# Patient Record
Sex: Female | Born: 1950 | Race: White | Hispanic: No | Smoking: Current every day smoker
Health system: Southern US, Community
[De-identification: ages and names within clinical notes are randomized; demographics above are authoritative.]

## PROBLEM LIST (undated history)

## (undated) DIAGNOSIS — I480 Paroxysmal atrial fibrillation: Secondary | ICD-10-CM

## (undated) DIAGNOSIS — I513 Intracardiac thrombosis, not elsewhere classified: Secondary | ICD-10-CM

## (undated) DIAGNOSIS — K632 Fistula of intestine: Secondary | ICD-10-CM

## (undated) DIAGNOSIS — R42 Dizziness and giddiness: Secondary | ICD-10-CM

## (undated) DIAGNOSIS — L299 Pruritus, unspecified: Secondary | ICD-10-CM

## (undated) DIAGNOSIS — R0600 Dyspnea, unspecified: Secondary | ICD-10-CM

## (undated) DIAGNOSIS — C3492 Malignant neoplasm of unspecified part of left bronchus or lung: Secondary | ICD-10-CM

## (undated) DIAGNOSIS — I639 Cerebral infarction, unspecified: Secondary | ICD-10-CM

## (undated) DIAGNOSIS — I10 Essential (primary) hypertension: Secondary | ICD-10-CM

## (undated) DIAGNOSIS — K5792 Diverticulitis of intestine, part unspecified, without perforation or abscess without bleeding: Secondary | ICD-10-CM

## (undated) DIAGNOSIS — R0609 Other forms of dyspnea: Secondary | ICD-10-CM

## (undated) DIAGNOSIS — Z872 Personal history of diseases of the skin and subcutaneous tissue: Secondary | ICD-10-CM

## (undated) DIAGNOSIS — Z9289 Personal history of other medical treatment: Secondary | ICD-10-CM

## (undated) DIAGNOSIS — Z8489 Family history of other specified conditions: Secondary | ICD-10-CM

## (undated) DIAGNOSIS — N183 Chronic kidney disease, stage 3 (moderate): Secondary | ICD-10-CM

## (undated) DIAGNOSIS — I2510 Atherosclerotic heart disease of native coronary artery without angina pectoris: Secondary | ICD-10-CM

## (undated) HISTORY — DX: Chronic kidney disease, stage 3 (moderate): N18.3

## (undated) HISTORY — PX: EXPLORATORY LAPAROTOMY: SUR591

## (undated) HISTORY — DX: Intracardiac thrombosis, not elsewhere classified: I51.3

## (undated) HISTORY — PX: THORACOSCOPY: SUR1347

## (undated) HISTORY — PX: COLONOSCOPY: SHX174

## (undated) HISTORY — PX: OTHER SURGICAL HISTORY: SHX169

## (undated) HISTORY — DX: Personal history of diseases of the skin and subcutaneous tissue: Z87.2

## (undated) HISTORY — PX: CORONARY ARTERY BYPASS GRAFT: SHX141

## (undated) HISTORY — PX: GALLBLADDER SURGERY: SHX652

## (undated) HISTORY — DX: Cerebral infarction, unspecified: I63.9

## (undated) HISTORY — DX: Pruritus, unspecified: L29.9

## (undated) HISTORY — PX: COLON SURGERY: SHX602

## (undated) HISTORY — DX: Fistula of intestine: K63.2

## (undated) HISTORY — DX: Essential (primary) hypertension: I10

## (undated) HISTORY — PX: CHOLECYSTECTOMY: SHX55

## (undated) HISTORY — DX: Atherosclerotic heart disease of native coronary artery without angina pectoris: I25.10

## (undated) HISTORY — DX: Dizziness and giddiness: R42

## (undated) HISTORY — PX: ABDOMINAL HYSTERECTOMY: SHX81

## (undated) HISTORY — PX: SIGMOID RESECTION / RECTOPEXY: SUR1294

---

## 1988-11-03 HISTORY — PX: OTHER SURGICAL HISTORY: SHX169

## 1989-11-03 DIAGNOSIS — I2510 Atherosclerotic heart disease of native coronary artery without angina pectoris: Secondary | ICD-10-CM

## 1989-11-03 HISTORY — DX: Atherosclerotic heart disease of native coronary artery without angina pectoris: I25.10

## 2014-11-03 DIAGNOSIS — C3492 Malignant neoplasm of unspecified part of left bronchus or lung: Secondary | ICD-10-CM

## 2014-11-03 HISTORY — DX: Malignant neoplasm of unspecified part of left bronchus or lung: C34.92

## 2015-11-28 DIAGNOSIS — N823 Fistula of vagina to large intestine: Secondary | ICD-10-CM | POA: Insufficient documentation

## 2015-12-29 DIAGNOSIS — N179 Acute kidney failure, unspecified: Secondary | ICD-10-CM | POA: Insufficient documentation

## 2015-12-29 DIAGNOSIS — E860 Dehydration: Secondary | ICD-10-CM | POA: Insufficient documentation

## 2016-06-19 DIAGNOSIS — C3412 Malignant neoplasm of upper lobe, left bronchus or lung: Secondary | ICD-10-CM | POA: Insufficient documentation

## 2016-06-19 DIAGNOSIS — C3492 Malignant neoplasm of unspecified part of left bronchus or lung: Secondary | ICD-10-CM | POA: Insufficient documentation

## 2016-06-25 DIAGNOSIS — R911 Solitary pulmonary nodule: Secondary | ICD-10-CM | POA: Insufficient documentation

## 2016-07-17 DIAGNOSIS — K118 Other diseases of salivary glands: Secondary | ICD-10-CM | POA: Insufficient documentation

## 2016-07-17 DIAGNOSIS — K119 Disease of salivary gland, unspecified: Secondary | ICD-10-CM | POA: Insufficient documentation

## 2017-02-26 DIAGNOSIS — Z951 Presence of aortocoronary bypass graft: Secondary | ICD-10-CM | POA: Insufficient documentation

## 2017-02-28 DIAGNOSIS — K632 Fistula of intestine: Secondary | ICD-10-CM | POA: Insufficient documentation

## 2017-02-28 DIAGNOSIS — L299 Pruritus, unspecified: Secondary | ICD-10-CM

## 2017-02-28 DIAGNOSIS — I2510 Atherosclerotic heart disease of native coronary artery without angina pectoris: Secondary | ICD-10-CM | POA: Insufficient documentation

## 2017-02-28 DIAGNOSIS — Z872 Personal history of diseases of the skin and subcutaneous tissue: Secondary | ICD-10-CM | POA: Insufficient documentation

## 2017-02-28 HISTORY — DX: Fistula of intestine: K63.2

## 2017-02-28 HISTORY — DX: Personal history of diseases of the skin and subcutaneous tissue: Z87.2

## 2017-02-28 HISTORY — DX: Pruritus, unspecified: L29.9

## 2017-04-09 ENCOUNTER — Other Ambulatory Visit: Payer: Self-pay | Admitting: Internal Medicine

## 2017-04-09 DIAGNOSIS — R42 Dizziness and giddiness: Secondary | ICD-10-CM | POA: Insufficient documentation

## 2017-04-09 DIAGNOSIS — N183 Chronic kidney disease, stage 3 unspecified: Secondary | ICD-10-CM | POA: Insufficient documentation

## 2017-04-09 DIAGNOSIS — I10 Essential (primary) hypertension: Secondary | ICD-10-CM

## 2017-04-09 HISTORY — DX: Dizziness and giddiness: R42

## 2017-04-09 HISTORY — DX: Essential (primary) hypertension: I10

## 2017-04-09 HISTORY — DX: Chronic kidney disease, stage 3 unspecified: N18.30

## 2017-04-15 ENCOUNTER — Other Ambulatory Visit: Payer: Self-pay

## 2017-04-15 ENCOUNTER — Telehealth: Payer: Self-pay

## 2017-04-15 NOTE — Telephone Encounter (Signed)
Patient had Colorectal surgery at [facility] in [place], [place] by Dr. Nelda Severe on 11/28/2015.    Records are available for review at this time in [place].

## 2017-04-16 ENCOUNTER — Ambulatory Visit
Admission: RE | Admit: 2017-04-16 | Discharge: 2017-04-16 | Disposition: A | Discharge status: Home / Self Care | Payer: Medicare Other | Source: Ambulatory Visit | Attending: Internal Medicine | Admitting: Internal Medicine

## 2017-04-16 DIAGNOSIS — N183 Chronic kidney disease, stage 3 unspecified: Secondary | ICD-10-CM

## 2017-04-23 ENCOUNTER — Encounter: Payer: Self-pay | Admitting: General Surgery

## 2017-04-23 ENCOUNTER — Ambulatory Visit (INDEPENDENT_AMBULATORY_CARE_PROVIDER_SITE_OTHER): Payer: Medicare Other | Admitting: General Surgery

## 2017-04-23 VITALS — BP 115/77 | HR 101 | Temp 97.7°F | Ht 63.0 in | Wt 147.4 lb

## 2017-04-23 DIAGNOSIS — K436 Other and unspecified ventral hernia with obstruction, without gangrene: Secondary | ICD-10-CM

## 2017-04-23 DIAGNOSIS — K1379 Other lesions of oral mucosa: Secondary | ICD-10-CM | POA: Diagnosis not present

## 2017-04-23 DIAGNOSIS — Z932 Ileostomy status: Secondary | ICD-10-CM

## 2017-04-23 DIAGNOSIS — C3492 Malignant neoplasm of unspecified part of left bronchus or lung: Secondary | ICD-10-CM | POA: Diagnosis not present

## 2017-04-23 DIAGNOSIS — L723 Sebaceous cyst: Secondary | ICD-10-CM | POA: Diagnosis not present

## 2017-04-23 DIAGNOSIS — L089 Local infection of the skin and subcutaneous tissue, unspecified: Secondary | ICD-10-CM

## 2017-04-23 MED ORDER — SULFAMETHOXAZOLE-TRIMETHOPRIM 800-160 MG PO TABS: 1.0000 | ORAL_TABLET | Freq: Two times a day (BID) | ORAL | 0 refills | Status: DC

## 2017-04-23 NOTE — Patient Instructions (Addendum)
We would like for you to have the CT scan 04/28/17 @ 8:30 at [facility] [place] [place], [place]   We will send referrals to Oncologist and Gastroenterologist and Ears Nose Throat specialist. Someone from their office will call to schedule your appointments. If you do not hear from them by next Friday call our office so we can check on this for you.  Please pick up your Medicine at the pharmacy.  Please see your follow up appointment listed below. We may possibly remove the cyst at this appointment.

## 2017-04-23 NOTE — Progress Notes (Signed)
Patient ID: Carly Khan, female   DOB: August 28, 1951, 66 y.o.   MRN: [id number]  CC: Ileostomy in place  HPI Carly Khan is a 66 y.o. female with multiple problems presents to clinic today for evaluation. 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 Patient states that the ostomy is functioning well, she has good seals and no complaints other than occasional irritation to the skin around it. Patient does not recall when her last colonoscopy was, and the computed appears to been in 2000. She underwent a barium enema last fall which showed the anastomosis and her colon was intact but she was unable to have a CT scan of her abdomen secondary to retained barium from that study. She also has an occasional bulge in her abdomen at the site of her midline incision that is especially worse with heavy lifting or straining. It causes occasional pains requiring her to push it back in. In addition of this she has numerous cysts on her skin that have been infected. She has a current infected cyst of her left posterior back that she was on antibiotics for that improved but did not completely go away. She has had other prior cyst underneath her right breast. In addition of this last fall she was identified as having lung cancer and underwent a left upper lobe lobectomy for adenocarcinoma of the lung with noted papillary, micropapillary and lepidic pattern changes. 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 The care for these were performed in [place] prior to moving here this past winter. Despite all these problems, she denies any current fevers, chills, nausea,  vomiting, chest pain, shortness of breath, changes in ostomy output. She continues to be an every day smoker and her recent smoking intake has increased to half pack per day.  HPI  Past Medical History:  Diagnosis Date  . CAD in native artery 02/28/2017  . CKD (chronic kidney disease) stage 3, GFR 30-59 ml/min 04/09/2017  . 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 Essential hypertension 04/09/2017  . History of sebaceous cyst 02/28/2017  . Intermittent vertigo 04/09/2017  . Itching 02/28/2017  . 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 cardiac bypass  1990  . carpel tunnel    . COLON SURGERY    . COLONOSCOPY    . CT guided needle placement    . EXPLORATORY LAPAROTOMY    . GALLBLADDER SURGERY    . loop ileostomy    . SIGMOID RESECTION / RECTOPEXY    . THORACOSCOPY    . vaginectomy      Family History  Problem Relation Age of Onset  . Cancer Mother   . Heart disease Father   . Hypertension Father     Social History Social History  Substance Use Topics  . Smoking status: Current Every Day Smoker    Packs/day: 0.25    Years: 40.00  . Smokeless tobacco: Never Used  . Alcohol use No    Allergies  Allergen Reactions  . Penicillin G Hives and Itching    Current Outpatient Prescriptions  Medication Sig Dispense  Refill  . albuterol (PROVENTIL HFA;VENTOLIN HFA) 108 (90 Base) MCG/ACT inhaler Inhale 2 puffs into the lungs every 6 (six) hours as needed.    Marland Kitchen aspirin EC 81 MG tablet Take 1 tablet by mouth daily.    . meclizine (ANTIVERT) 25 MG tablet Take 1 tablet by mouth 3 (three) times daily as needed.    . metoprolol succinate (TOPROL-XL) 25 MG 24 hr tablet Take 1 tablet by mouth daily.    Marland Kitchen sulfamethoxazole-trimethoprim (BACTRIM DS,SEPTRA DS) 800-160 MG tablet Take 1 tablet by mouth 2 (two) times daily. 14 tablet 0   No current facility-administered medications for this visit.      Review of Systems A Multi-point  review of systems was asked and was negative except for the findings and in the history of present illness  Physical Exam Blood pressure 115/77, pulse (!) 101, temperature 97.7 F (36.5 C), temperature source Oral, height 5\' 3"  (1.6 m), weight 66.9 kg (147 lb 6.4 oz). CONSTITUTIONAL: No acute distress. EYES: Pupils are equal, round, and reactive to light, Sclera are non-icteric. EARS, NOSE, MOUTH AND THROAT: The oropharynx is shows changes secondary to poor dentition and is tender, especially on the left. The oral mucosa is pink and moist. Hearing is intact to voice. LYMPH NODES:  Lymph nodes in the neck are grossly normal. RESPIRATORY:  Lungs are coarse throughout but equal. There is normal respiratory effort, with equal breath sounds bilaterally, and without pathologic use of accessory muscles. Well-healed left thoracotomy incision site and left chest tube sites CARDIOVASCULAR: Heart is regular without murmurs, gallops, or rubs. GI: The abdomen is soft, nontender, and nondistended. There is a loop ileostomy present in the right lower quadrant with 2 visible barrels that are pink, patent, productive of stool. There is an obvious fascial defect in the midline with a visible bulge with Valsalva. There is no hepatosplenomegaly. There are normal bowel sounds in all quadrants. GU: Rectal deferred.   MUSCULOSKELETAL: Normal muscle strength and tone. No cyanosis or edema.   SKIN: Turgor is good and there are multiple skin changes consistent with sebaceous/dermoid cyst. A 3 cm infected cyst on the left shoulder, a 2 cm noninfected cyst underneath the right breast, evidence of an infected 5 mm dermoid cyst to the right groin area. NEUROLOGIC: Motor and sensation is grossly normal. Cranial nerves are grossly intact. PSYCH:  Oriented to person, place and time. Affect is normal.  Data Reviewed Images and labs reviewed. They are all reported and care everywhere and from last fall. Most recent CBC was 9 months  ago which showed anemia with an H&H of 8 point 6/26 but normal white blood cell count and normal platelets. Last chemistries performed on the same date which showed numerous electrolyte abnormalities including hyponatremia of 1:30, hypochloremia of 97, mildly elevated creatinine of 1.7, BUN of 20. 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 Barium enema shows no evidence of fistula or anastomotic leak. I have personally reviewed the patient's imaging, laboratory findings and medical records.    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    Plan    66 year old female with multiple problems presents to clinic today to establish care. In terms of her loop ileostomy discussed that we would need to obtain a CT scan of the abdomen  prior to scheduling his reversal even though she's had a normal. Minimal. This is mostly due to the hernia that is present to the midline and 2 evaluate her abdominal anatomy prior to scheduling any surgical intervention. We will also refer GI for colonoscopy prior to doing any surgery for her hernia given that she is due for colonoscopy within the next 2 years. Patient also has an infected sebaceous cyst on her left shoulder. She'll be prescribed a course of Bactrim and follow-up in clinic in 2 weeks to evaluate resolution of infection. Due to her recent surgery for lung cancer we will also perform a CT scan of her chest and refer her to oncology for further lung cancer follow-up and screening. Due to her oral pain and abdomen mildly seen on CT scan of the head and neck performed 7 months ago we will refer to ENT for further evaluation.  Patient will follow-up in clinic in 2 weeks at which point we will evaluate improvement on antibiotics, and discuss continue  scheduling for all her medical problems. Hopefully able to perform surgery to reverse her ileostomy and repair her ventral hernia after being evaluated by GI, oncology, ENT.     Time spent with the patient was 45 minutes, with more than 50% of the time spent in face-to-face education, counseling and care coordination.     Clayburn Pert, MD FACS General Surgeon 04/23/2017, 9:56 AM

## 2017-04-24 ENCOUNTER — Telehealth: Payer: Self-pay | Admitting: General Surgery

## 2017-04-24 ENCOUNTER — Telehealth: Payer: Self-pay | Admitting: Gastroenterology

## 2017-04-24 MED ORDER — SULFAMETHOXAZOLE-TRIMETHOPRIM 800-160 MG PO TABS: 1.0000 | ORAL_TABLET | Freq: Two times a day (BID) | ORAL | 0 refills | Status: DC

## 2017-04-24 NOTE — Telephone Encounter (Signed)
Medication sent to [facility] [place] at this time.

## 2017-04-24 NOTE — Telephone Encounter (Signed)
Patient's daughter says the pharmacy has not received her medication. She wants to make sure it is called into [facility] on [address]. She would like a call back once its called in.

## 2017-04-24 NOTE — Telephone Encounter (Signed)
Made appointment for 8/13 for patient. [facility] called to see if we could get her in sooner. I called patient and offered her 7/16. She stated she couldn't come in that day or 8/13. She said she would call me back to schedule.

## 2017-04-28 ENCOUNTER — Ambulatory Visit (INDEPENDENT_AMBULATORY_CARE_PROVIDER_SITE_OTHER): Payer: Medicare Other | Admitting: Internal Medicine

## 2017-04-28 ENCOUNTER — Encounter: Payer: Self-pay | Admitting: Internal Medicine

## 2017-04-28 ENCOUNTER — Telehealth: Payer: Self-pay

## 2017-04-28 ENCOUNTER — Ambulatory Visit
Admission: RE | Admit: 2017-04-28 | Discharge: 2017-04-28 | Disposition: A | Discharge status: Home / Self Care | Payer: Medicare Other | Source: Ambulatory Visit | Attending: General Surgery | Admitting: General Surgery

## 2017-04-28 ENCOUNTER — Ambulatory Visit
Admission: RE | Admit: 2017-04-28 | Discharge: 2017-04-28 | Disposition: A | Discharge status: Home / Self Care | Payer: Medicare Other | Source: Ambulatory Visit | Attending: Surgery | Admitting: Surgery

## 2017-04-28 VITALS — BP 102/56 | HR 75 | Ht 64.0 in | Wt 146.5 lb

## 2017-04-28 DIAGNOSIS — E875 Hyperkalemia: Secondary | ICD-10-CM | POA: Diagnosis not present

## 2017-04-28 DIAGNOSIS — Z1389 Encounter for screening for other disorder: Secondary | ICD-10-CM | POA: Insufficient documentation

## 2017-04-28 DIAGNOSIS — I5189 Other ill-defined heart diseases: Secondary | ICD-10-CM

## 2017-04-28 DIAGNOSIS — I10 Essential (primary) hypertension: Secondary | ICD-10-CM

## 2017-04-28 DIAGNOSIS — R9431 Abnormal electrocardiogram [ECG] [EKG]: Secondary | ICD-10-CM

## 2017-04-28 DIAGNOSIS — R55 Syncope and collapse: Secondary | ICD-10-CM | POA: Diagnosis not present

## 2017-04-28 DIAGNOSIS — J439 Emphysema, unspecified: Secondary | ICD-10-CM | POA: Insufficient documentation

## 2017-04-28 DIAGNOSIS — I519 Heart disease, unspecified: Secondary | ICD-10-CM

## 2017-04-28 DIAGNOSIS — I513 Intracardiac thrombosis, not elsewhere classified: Secondary | ICD-10-CM

## 2017-04-28 DIAGNOSIS — I700 Atherosclerosis of aorta: Secondary | ICD-10-CM

## 2017-04-28 DIAGNOSIS — K436 Other and unspecified ventral hernia with obstruction, without gangrene: Secondary | ICD-10-CM

## 2017-04-28 DIAGNOSIS — C3492 Malignant neoplasm of unspecified part of left bronchus or lung: Secondary | ICD-10-CM

## 2017-04-28 DIAGNOSIS — I8290 Acute embolism and thrombosis of unspecified vein: Secondary | ICD-10-CM

## 2017-04-28 DIAGNOSIS — R0609 Other forms of dyspnea: Secondary | ICD-10-CM | POA: Diagnosis not present

## 2017-04-28 DIAGNOSIS — N170 Acute kidney failure with tubular necrosis: Secondary | ICD-10-CM | POA: Diagnosis not present

## 2017-04-28 DIAGNOSIS — R0600 Dyspnea, unspecified: Secondary | ICD-10-CM

## 2017-04-28 HISTORY — DX: Malignant neoplasm of unspecified part of left bronchus or lung: C34.92

## 2017-04-28 MED ORDER — IOPAMIDOL (ISOVUE-300) INJECTION 61%
85.0000 mL | Freq: Once | INTRAVENOUS | Status: AC | PRN
  Administered 2017-04-28: 85 mL via INTRAVENOUS

## 2017-04-28 MED ORDER — APIXABAN 5 MG PO TABS: 5.0000 mg | ORAL_TABLET | Freq: Two times a day (BID) | ORAL | 3 refills | Status: DC

## 2017-04-28 NOTE — Patient Instructions (Addendum)
Medication Instructions:  Your physician has recommended you make the following change in your medication:  1- STOP taking Lisinopril/HCTZ. 2- START Eliquis 5 mg (1 tablet) by mouth two times a day.   Labwork: Your physician recommends that you return for lab work in: [place] or [place] at [facility]. - Please go to the [facility]. You will check in at the front desk to the right as you walk into the atrium. Valet Parking is offered if needed.     Testing/Procedures: Your physician has recommended that you wear an event monitor. Event monitors are medical devices that record the heart's electrical activity. Doctors most often [place] these monitors to diagnose arrhythmias. Arrhythmias are problems with the speed or rhythm of the heartbeat. The monitor is a small, portable device. You can wear one while you do your normal daily activities. This is usually used to diagnose what is causing palpitations/syncope (passing out). - You will be mailed a monitor from [employer].  - They will call you in the next day or so to verify your address. Then is will take 5-7 days to be mailed to you. - You will wear for 30 days and then place all the pieces of equipment that came with the device back in the provided box and take it to your nearest UPS drop off locations. - Call [employer] at [phone], if you have any questions concerning the monitor once you have received it. - DO NOT GET THE MONITOR WET.     Your physician has requested that you have an echocardiogram (already scheduled). Echocardiography is a painless test that uses sound waves to create images of your heart. It provides your doctor with information about the size and shape of your heart and how well your heart's chambers and valves are working. This procedure takes approximately one hour. There are no restrictions for this procedure.  Your physician has requested that you have a TEE. During a TEE, sound waves are used to create  images of your heart. It provides your doctor with information about the size and shape of your heart and how well your heart's chambers and valves are working. In this test, a transducer is attached to the end of a flexible tube that's guided down your throat and into your esophagus (the tube leading from you mouth to your stomach) to get a more detailed image of your heart. You are not awake for the procedure. Please see the instruction sheet given to you today. For further information please visit [email or web address].   You are scheduled for a Transesophageal Echocardiogram (TEE) on __06/28/18___ with Dr.__Arida_ Please arrive at the [place] [facility] at _06:30 am__ a.m. on the day of your procedure.  DIET INSTRUCTIONS:  Nothing to eat or drink after midnight except your medications with a              sip of water.         1) Labs: ___TODAY or Tomorrow at [facility] (CBC, BMP, PT/INR)______  2) Medications:  YOU MAY TAKE ALL of your remaining medications with a small amount of water.  3) Must have a responsible person to drive you home.  4) Bring a current list of your medications and current insurance cards.    If you have any questions after you get home, please call the office at [phone]  Follow-Up: Your physician recommends that you schedule a follow-up appointment in: 6-8 WEEKS WITH DR END AFTER MONITOR HAS BEEN WORN.  If you need a refill on your cardiac medications before your next appointment, please call your pharmacy.   Echocardiogram An echocardiogram, or echocardiography, uses sound waves (ultrasound) to produce an image of your heart. The echocardiogram is simple, painless, obtained within a short period of time, and offers valuable information to your health care provider. The images from an echocardiogram can provide information such as:  Evidence of coronary artery disease (CAD).  Heart size.  Heart muscle function.  Heart valve function.  Aneurysm  detection.  Evidence of a past heart attack.  Fluid buildup around the heart.  Heart muscle thickening.  Assess heart valve function.  Tell a health care provider about:  Any allergies you have.  All medicines you are taking, including vitamins, herbs, eye drops, creams, and over-the-counter medicines.  Any problems you or family members have had with anesthetic medicines.  Any blood disorders you have.  Any surgeries you have had.  Any medical conditions you have.  Whether you are pregnant or may be pregnant. What happens before the procedure? No special preparation is needed. Eat and drink normally. What happens during the procedure?  In order to produce an image of your heart, gel will be applied to your chest and a wand-like tool (transducer) will be moved over your chest. The gel will help transmit the sound waves from the transducer. The sound waves will harmlessly bounce off your heart to allow the heart images to be captured in real-time motion. These images will then be recorded.  You may need an IV to receive a medicine that improves the quality of the pictures. What happens after the procedure? You may return to your normal schedule including diet, activities, and medicines, unless your health care provider tells you otherwise. This information is not intended to replace advice given to you by your health care provider. Make sure you discuss any questions you have with your health care provider. Document Released: 10/17/2000 Document Revised: 06/07/2016 Document Reviewed: 06/27/2013 Elsevier Interactive Patient Education  2017 [place].     Transesophageal Echocardiogram Transesophageal echocardiography (TEE) is a picture test of your heart using sound waves. The pictures taken can give very detailed pictures of your heart. This can help your doctor see if there are problems with your heart. TEE can check:  If your heart has blood clots in it.  How well  your heart valves are working.  If you have an infection on the inside of your heart.  Some of the major arteries of your heart.  If your heart valve is working after a Office manager.  Your heart before a procedure that uses a shock to your heart to get the rhythm back to normal.  What happens before the procedure?  Do not eat or drink for 6 hours before the procedure or as told by your doctor.  Make plans to have someone drive you home after the procedure. Do not drive yourself home.  An IV tube will be put in your arm. What happens during the procedure?  You will be given a medicine to help you relax (sedative). It will be given through the IV tube.  A numbing medicine will be sprayed or gargled in the back of your throat to help numb it.  The tip of the probe is placed into the back of your mouth. You will be asked to swallow. This helps to pass the probe into your esophagus.  Once the tip of the probe is in the right place, your  doctor can take pictures of your heart.  You may feel pressure at the back of your throat. What happens after the procedure?  You will be taken to a recovery area so the sedative can wear off.  Your throat may be sore and scratchy. This will go away slowly over time.  You will go home when you are fully awake and able to swallow liquids.  You should have someone stay with you for the next 24 hours.  Do not drive or operate machinery for the next 24 hours. This information is not intended to replace advice given to you by your health care provider. Make sure you discuss any questions you have with your health care provider. Document Released: 08/17/2009 Document Revised: 03/27/2016 Document Reviewed: 04/21/2013 Elsevier Interactive Patient Education  [employer].

## 2017-04-28 NOTE — Telephone Encounter (Signed)
Critical result call from [place] at [facility] radiology at this time.   Spoke with Dr.Davis at this time. Echocardiogram and EKG and BMP were ordered stat.  Patient / Daughter Caryl Pina notified of CT results and appointment listed below.  Cardiology Consult @ 3 pm today Dr.Christopher End.   Patient / daughter en route to Cardiopulmonary @ [facility] for EKG/ECHO.   Patient understands that she will have EKG at Cardiopulmonary  now and then go to her cardiology appointment at 3 pm .

## 2017-04-28 NOTE — Progress Notes (Signed)
New Outpatient Visit Date: 04/28/2017  Referring Provider: Glendon Axe, [address] [facility] [employer], [place] [address]  Chief Complaint: Abnormal chest CT  HPI:  Carly Khan is a 66 y.o. female who is being seen today for the evaluation of abnormal chest abnormal chest CT demonstrating possible left atrial appendage thrombus. 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 She underwent CT chest, abdomen, and pelvis today to evaluate for feasibility of ostomy takedown as well as surveillance of her lung cancer. A nonenhancing filling defect was noted in the left atrial appendage, prompting urgent referral to cardiology.  Carly Khan moved to [place] in January. She has not had any cardiovascular follow-up for at least 2-3 years. She reports significant exertional dyspnea over the last 2 months with even modest activity such as climbing a few stairs. She is not dyspneic at rest nor has she experienced any chest pain. She denies palpitations but frequently has lightheadedness as if she might black out. This has been attributed to vertigo in the past for which she uses meclizine, though does not seem to help. She has not passed out. She denies bleeding and falls. His judgment denies a history of arrhythmia, including atrial fibrillation. She has never worn a heart monitor. She also denies focal neurologic deficits, though at times she does have some difficulty finding words and remembering names.  --------------------------------------------------------------------------------------------------  Cardiovascular History & Procedures: Cardiovascular Problems:  Left atrial appendage mass/thrombus  Coronary artery disease status post CABG  Risk Factors:  Known coronary artery disease,  hypertension, sedentary lifestyle, tobacco use, and age greater than 59  Cath/PCI:  Not available  CV Surgery:  CABG (1991, [place]): Details unknown  EP Procedures and Devices:  None  Non-Invasive Evaluation(s):  None available  Recent CV Pertinent Labs: See below  --------------------------------------------------------------------------------------------------  Past Medical History:  Diagnosis Date  . CAD in native artery 02/28/2017  . Cancer of left lung ([place]) 2016   LUL lung resection  . CKD (chronic kidney disease) stage 3, GFR 30-59 ml/min 04/09/2017  . 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 Essential hypertension 04/09/2017  . History of sebaceous cyst 02/28/2017  . Intermittent vertigo 04/09/2017  . Itching 02/28/2017  . 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 cardiac bypass  1990  . carpel tunnel    . COLON SURGERY    . COLONOSCOPY    . CT guided needle placement    . EXPLORATORY LAPAROTOMY    . GALLBLADDER SURGERY    . loop ileostomy    . SIGMOID RESECTION / RECTOPEXY    . THORACOSCOPY    . vaginectomy      Current Meds  Medication Sig  . albuterol (PROVENTIL HFA;VENTOLIN HFA) 108 (90 Base) MCG/ACT inhaler Inhale 2 puffs into the lungs every 6 (six) hours as needed.  Marland Kitchen aspirin EC 81 MG tablet Take 1 tablet by mouth daily.  Marland Kitchen lisinopril-hydrochlorothiazide (PRINZIDE,ZESTORETIC) 10-12.5 MG tablet Take 1 tablet by mouth daily.  . meclizine (ANTIVERT) 25 MG tablet Take 25 mg by mouth 3 (three) times daily as needed for dizziness.  . metoprolol succinate (TOPROL-XL) 25 MG 24 hr tablet Take 1 tablet by mouth daily.  . pravastatin (PRAVACHOL) 10 MG tablet Take 10 mg by mouth at  bedtime.  . sulfamethoxazole-trimethoprim (BACTRIM DS,SEPTRA DS) 800-160 MG tablet Take 1 tablet by mouth 2 (two) times daily.    Allergies: Penicillin g  Social History   Social History  . Marital status: Divorced     Spouse name: N/A  . Number of children: N/A  . Years of education: N/A   Occupational History  . Not on file.   Social History Main Topics  . Smoking status: Current Every Day Smoker    Packs/day: 0.50    Years: 40.00  . Smokeless tobacco: Never Used  . Alcohol use No  . Drug use: No  . Sexual activity: No   Other Topics Concern  . Not on file   Social History Narrative  . No narrative on file    Family History  Problem Relation Age of Onset  . Cancer Mother   . Heart disease Father   . Hypertension Father     Review of Systems: A 12-system review of systems was performed and was negative except as noted in the HPI.  --------------------------------------------------------------------------------------------------  Physical Exam: BP (!) 102/56 (BP Location: Left Arm, Patient Position: Sitting, Cuff Size: Normal)   Pulse 75   Ht 5\' 4"  (1.626 m)   Wt 146 lb 8 oz (66.5 kg)   BMI 25.15 kg/m   General:  Well-developed woman, appearing older than her stated age, seated in the exam room. She is accompanied by her daughter. HEENT: No conjunctival pallor or scleral icterus. Moist mucous membranes. OP clear. Neck: Supple without lymphadenopathy, thyromegaly, JVD, or HJR. No carotid bruit. Lungs: Normal work of breathing. Mildly diminished breath sounds throughout without wheezes or crackles. Heart: Regular rate and rhythm without murmurs, rubs, or gallops. Non-displaced PMI. Abd: Bowel sounds present. Soft, NT/ND without hepatosplenomegaly. Right lower quadrant ostomy present. Ext: No lower extremity edema. 2+ right radial pulse. Left radial artery is surgically absent. Trace pedal pulses bilaterally. Skin: Warm and dry without rash. Neuro: CNIII-XII intact. Strength and fine-touch sensation intact in upper and lower extremities bilaterally. Psych: Normal mood and affect.  EKG:  Normal sinus rhythm with poor R-wave progression V1 and V2 as well as nonspecific ST/T changes  in the limb leads.  Outside labs (04/02/17): CBC: WBC 7.2, HGB 14.2, HCT 40.8, platelets 259  CMP: Sodium 138, potassium 4.7, chloride 105, CO2 25, BUN 18, creatinine 1.4, glucose 118, calcium 9.5, AST 14, ALT 7, alkaline phosphatase 95, total bilirubin 0.8, total protein 7.7, albumin 4.2  Outside labs (02/26/17): Hemoglobin A1c: 6.4  Lipid panel: Total cholesterol 127, triglyceride 289, HDL 41, LDL 88  TSH: 3.141  --------------------------------------------------------------------------------------------------  ASSESSMENT AND PLAN: Left atrial mass/thrombus I personally reviewed today's CT scan, which demonstrates an ovoid nonenhancing structure in the left atrial appendage. Given its appearance and location, I am most concerned for a thrombus. Most likely cause for this would be paroxysmal atrial fibrillation. This would be an unusual location for a soft tissue mass. We have agreed to initiate anticoagulation with apixaban 5 mg twice a day. We will have the patient return tomorrow for a transthoracic echocardiogram to evaluate for structural heart abnormalities. We will also order a 30-day event monitor to evaluate for paroxysmal atrial fibrillation. Once the patient has completed 4-6 weeks of anticoagulation, we will arrange for TEE to evaluate for resolution of the left atrial mass/thrombus. If it persists, we will discuss cardiac MRI to evaluate for a soft-tissue mass.  Dyspnea on exertion This is likely multifactorial, including underlying lung disease (including one resection) and  potential cardiac pathology. We will obtain a transthoracic echocardiogram tomorrow to evaluate for significant structural abnormalities. Carly Khan appears euvolemic on exam today. I will therefore defer adding any medications today other than the aforementioned apixaban.  Hypertension with near syncope Blood pressure is borderline low today. Given frequent episodes of lightheadedness, we have agreed to  discontinue lisinopril/HCTZ. We will continue Carly Khan current dose of metoprolol.  Follow-up: Return to clinic in 6-8 weeks (after completion of TTE, TEE, and event monitor).  [name], MD 04/28/2017 10:04 PM

## 2017-04-29 ENCOUNTER — Telehealth: Payer: Self-pay | Admitting: *Deleted

## 2017-04-29 ENCOUNTER — Other Ambulatory Visit: Payer: Self-pay

## 2017-04-29 ENCOUNTER — Inpatient Hospital Stay
Admission: EM | Admit: 2017-04-29 | Discharge: 2017-05-04 | DRG: 683 | Disposition: A | Discharge status: Home / Self Care | Payer: Medicare Other | Attending: Internal Medicine | Admitting: Internal Medicine

## 2017-04-29 ENCOUNTER — Telehealth: Payer: Self-pay

## 2017-04-29 ENCOUNTER — Other Ambulatory Visit
Admission: RE | Admit: 2017-04-29 | Discharge: 2017-04-29 | Disposition: A | Discharge status: Home / Self Care | Payer: Medicare Other | Source: Ambulatory Visit | Attending: Internal Medicine | Admitting: Internal Medicine

## 2017-04-29 ENCOUNTER — Encounter: Payer: Self-pay | Admitting: *Deleted

## 2017-04-29 ENCOUNTER — Ambulatory Visit (HOSPITAL_BASED_OUTPATIENT_CLINIC_OR_DEPARTMENT_OTHER)
Admission: RE | Admit: 2017-04-29 | Discharge: 2017-04-29 | Disposition: A | Discharge status: Home / Self Care | Payer: Medicare Other | Source: Ambulatory Visit | Attending: Surgery | Admitting: Surgery

## 2017-04-29 DIAGNOSIS — I5189 Other ill-defined heart diseases: Secondary | ICD-10-CM

## 2017-04-29 DIAGNOSIS — R310 Gross hematuria: Secondary | ICD-10-CM | POA: Diagnosis not present

## 2017-04-29 DIAGNOSIS — I129 Hypertensive chronic kidney disease with stage 1 through stage 4 chronic kidney disease, or unspecified chronic kidney disease: Secondary | ICD-10-CM | POA: Diagnosis present

## 2017-04-29 DIAGNOSIS — F17200 Nicotine dependence, unspecified, uncomplicated: Secondary | ICD-10-CM | POA: Diagnosis present

## 2017-04-29 DIAGNOSIS — Z902 Acquired absence of lung [part of]: Secondary | ICD-10-CM

## 2017-04-29 DIAGNOSIS — Z880 Allergy status to penicillin: Secondary | ICD-10-CM | POA: Diagnosis not present

## 2017-04-29 DIAGNOSIS — I2510 Atherosclerotic heart disease of native coronary artery without angina pectoris: Secondary | ICD-10-CM | POA: Diagnosis present

## 2017-04-29 DIAGNOSIS — E871 Hypo-osmolality and hyponatremia: Secondary | ICD-10-CM | POA: Diagnosis present

## 2017-04-29 DIAGNOSIS — N183 Chronic kidney disease, stage 3 (moderate): Secondary | ICD-10-CM | POA: Diagnosis present

## 2017-04-29 DIAGNOSIS — Z933 Colostomy status: Secondary | ICD-10-CM | POA: Diagnosis not present

## 2017-04-29 DIAGNOSIS — I959 Hypotension, unspecified: Secondary | ICD-10-CM | POA: Diagnosis present

## 2017-04-29 DIAGNOSIS — Y92009 Unspecified place in unspecified non-institutional (private) residence as the place of occurrence of the external cause: Secondary | ICD-10-CM | POA: Diagnosis not present

## 2017-04-29 DIAGNOSIS — Z7982 Long term (current) use of aspirin: Secondary | ICD-10-CM | POA: Diagnosis not present

## 2017-04-29 DIAGNOSIS — E872 Acidosis: Secondary | ICD-10-CM | POA: Diagnosis present

## 2017-04-29 DIAGNOSIS — Z7901 Long term (current) use of anticoagulants: Secondary | ICD-10-CM

## 2017-04-29 DIAGNOSIS — I519 Heart disease, unspecified: Secondary | ICD-10-CM | POA: Insufficient documentation

## 2017-04-29 DIAGNOSIS — T370X5A Adverse effect of sulfonamides, initial encounter: Secondary | ICD-10-CM | POA: Diagnosis present

## 2017-04-29 DIAGNOSIS — I513 Intracardiac thrombosis, not elsewhere classified: Secondary | ICD-10-CM

## 2017-04-29 DIAGNOSIS — Z951 Presence of aortocoronary bypass graft: Secondary | ICD-10-CM | POA: Diagnosis not present

## 2017-04-29 DIAGNOSIS — Z85118 Personal history of other malignant neoplasm of bronchus and lung: Secondary | ICD-10-CM

## 2017-04-29 DIAGNOSIS — Z95828 Presence of other vascular implants and grafts: Secondary | ICD-10-CM

## 2017-04-29 DIAGNOSIS — N170 Acute kidney failure with tubular necrosis: Secondary | ICD-10-CM | POA: Diagnosis present

## 2017-04-29 DIAGNOSIS — N141 Nephropathy induced by other drugs, medicaments and biological substances: Secondary | ICD-10-CM | POA: Diagnosis present

## 2017-04-29 DIAGNOSIS — I739 Peripheral vascular disease, unspecified: Secondary | ICD-10-CM | POA: Diagnosis present

## 2017-04-29 DIAGNOSIS — E875 Hyperkalemia: Secondary | ICD-10-CM

## 2017-04-29 DIAGNOSIS — N179 Acute kidney failure, unspecified: Secondary | ICD-10-CM | POA: Diagnosis not present

## 2017-04-29 DIAGNOSIS — Z8249 Family history of ischemic heart disease and other diseases of the circulatory system: Secondary | ICD-10-CM

## 2017-04-29 LAB — CBC WITH DIFFERENTIAL/PLATELET
Basophils Absolute: 0.1 10*3/uL (ref 0–0.1)
Basophils Relative: 1 %
EOS PCT: 1 %
Eosinophils Absolute: 0.1 10*3/uL (ref 0–0.7)
HEMATOCRIT: 42.1 % (ref 35.0–47.0)
Hemoglobin: 14.2 g/dL (ref 12.0–16.0)
LYMPHS ABS: 1.9 10*3/uL (ref 1.0–3.6)
LYMPHS PCT: 20 %
MCH: 32.9 pg (ref 26.0–34.0)
MCHC: 33.7 g/dL (ref 32.0–36.0)
MCV: 97.6 fL (ref 80.0–100.0)
MONO ABS: 0.6 10*3/uL (ref 0.2–0.9)
MONOS PCT: 6 %
NEUTROS ABS: 6.6 10*3/uL — AB (ref 1.4–6.5)
Neutrophils Relative %: 72 %
PLATELETS: 237 10*3/uL (ref 150–440)
RBC: 4.31 MIL/uL (ref 3.80–5.20)
RDW: 12.8 % (ref 11.5–14.5)
WBC: 9.3 10*3/uL (ref 3.6–11.0)

## 2017-04-29 LAB — BASIC METABOLIC PANEL
ANION GAP: 9 (ref 5–15)
Anion gap: 10 (ref 5–15)
BUN: 89 mg/dL — ABNORMAL HIGH (ref 6–20)
BUN: 91 mg/dL — AB (ref 6–20)
CALCIUM: 8.9 mg/dL (ref 8.9–10.3)
CHLORIDE: 110 mmol/L (ref 101–111)
CO2: 13 mmol/L — ABNORMAL LOW (ref 22–32)
CO2: 11 mmol/L — AB (ref 22–32)
CREATININE: 5.25 mg/dL — AB (ref 0.44–1.00)
CREATININE: 5.37 mg/dL — AB (ref 0.44–1.00)
Calcium: 8.9 mg/dL (ref 8.9–10.3)
Chloride: 106 mmol/L (ref 101–111)
GFR calc Af Amer: 9 mL/min — ABNORMAL LOW (ref >60)
GFR calc non Af Amer: 7 mL/min — ABNORMAL LOW (ref >60)
GFR, EST AFRICAN AMERICAN: 9 mL/min — AB (ref >60)
GFR, EST NON AFRICAN AMERICAN: 8 mL/min — AB (ref >60)
Glucose, Bld: 97 mg/dL (ref 65–99)
Glucose, Bld: 106 mg/dL — ABNORMAL HIGH (ref 65–99)
POTASSIUM: 7.4 mmol/L — AB (ref 3.5–5.1)
Potassium: 7.3 mmol/L (ref 3.5–5.1)
SODIUM: 128 mmol/L — AB (ref 135–145)
SODIUM: 131 mmol/L — AB (ref 135–145)

## 2017-04-29 LAB — GLUCOSE, CAPILLARY: GLUCOSE-CAPILLARY: 90 mg/dL (ref 65–99)

## 2017-04-29 LAB — PROTIME-INR
INR: 1.3
Prothrombin Time: 16.3 seconds — ABNORMAL HIGH (ref 11.4–15.2)

## 2017-04-29 LAB — MAGNESIUM: MAGNESIUM: 2.7 mg/dL — AB (ref 1.7–2.4)

## 2017-04-29 LAB — PHOSPHORUS: Phosphorus: 9.2 mg/dL — ABNORMAL HIGH (ref 2.5–4.6)

## 2017-04-29 LAB — APTT: APTT: 33 s (ref 24–36)

## 2017-04-29 LAB — HEPARIN LEVEL (UNFRACTIONATED): HEPARIN UNFRACTIONATED: 1.58 [IU]/mL — AB (ref 0.30–0.70)

## 2017-04-29 MED ORDER — ONDANSETRON HCL 4 MG/2ML IJ SOLN: 4.0000 mg | Freq: Four times a day (QID) | INTRAMUSCULAR | Status: DC | PRN

## 2017-04-29 MED ORDER — ASPIRIN EC 81 MG PO TBEC
81.0000 mg | DELAYED_RELEASE_TABLET | Freq: Every day | ORAL | Status: DC
  Administered 2017-04-30: 81 mg via ORAL
  Filled 2017-04-29: qty 1

## 2017-04-29 MED ORDER — SODIUM CHLORIDE 0.9 % IV SOLN: 100.0000 mL | INTRAVENOUS | Status: DC | PRN

## 2017-04-29 MED ORDER — ACETAMINOPHEN 325 MG PO TABS
650.0000 mg | ORAL_TABLET | Freq: Four times a day (QID) | ORAL | Status: DC | PRN
  Administered 2017-04-30 – 2017-05-03 (×4): 650 mg via ORAL
  Filled 2017-04-29 (×4): qty 2

## 2017-04-29 MED ORDER — HEPARIN SODIUM (PORCINE) 1000 UNIT/ML DIALYSIS
1000.0000 [IU] | INTRAMUSCULAR | Status: DC | PRN
  Filled 2017-04-29: qty 1

## 2017-04-29 MED ORDER — NICOTINE 14 MG/24HR TD PT24
14.0000 mg | MEDICATED_PATCH | Freq: Every day | TRANSDERMAL | Status: DC
  Administered 2017-04-30 – 2017-05-04 (×5): 14 mg via TRANSDERMAL
  Filled 2017-04-29 (×5): qty 1

## 2017-04-29 MED ORDER — ACETAMINOPHEN 650 MG RE SUPP: 650.0000 mg | Freq: Four times a day (QID) | RECTAL | Status: DC | PRN

## 2017-04-29 MED ORDER — PENTAFLUOROPROP-TETRAFLUOROETH EX AERO
1.0000 "application " | INHALATION_SPRAY | CUTANEOUS | Status: DC | PRN
  Filled 2017-04-29: qty 30

## 2017-04-29 MED ORDER — LIDOCAINE-PRILOCAINE 2.5-2.5 % EX CREA
1.0000 "application " | TOPICAL_CREAM | CUTANEOUS | Status: DC | PRN
  Filled 2017-04-29: qty 5

## 2017-04-29 MED ORDER — HEPARIN (PORCINE) IN NACL 100-0.45 UNIT/ML-% IJ SOLN
950.0000 [IU]/h | INTRAMUSCULAR | Status: DC
  Administered 2017-04-29 – 2017-05-02 (×3): 950 [IU]/h via INTRAVENOUS
  Filled 2017-04-29 (×3): qty 250

## 2017-04-29 MED ORDER — CALCIUM GLUCONATE 10 % IV SOLN
1.0000 g | Freq: Once | INTRAVENOUS | Status: AC
  Administered 2017-04-29: 1 g via INTRAVENOUS
  Filled 2017-04-29: qty 10

## 2017-04-29 MED ORDER — ONDANSETRON HCL 4 MG PO TABS: 4.0000 mg | ORAL_TABLET | Freq: Four times a day (QID) | ORAL | Status: DC | PRN

## 2017-04-29 MED ORDER — ALBUTEROL SULFATE (2.5 MG/3ML) 0.083% IN NEBU
3.0000 mL | INHALATION_SOLUTION | Freq: Four times a day (QID) | RESPIRATORY_TRACT | Status: DC | PRN
  Administered 2017-05-04: 3 mL via RESPIRATORY_TRACT
  Filled 2017-04-29: qty 3

## 2017-04-29 MED ORDER — METOPROLOL SUCCINATE ER 25 MG PO TB24: 25.0000 mg | ORAL_TABLET | Freq: Every day | ORAL | Status: DC

## 2017-04-29 MED ORDER — ALTEPLASE 2 MG IJ SOLR: 2.0000 mg | Freq: Once | INTRAMUSCULAR | Status: DC | PRN

## 2017-04-29 MED ORDER — LIDOCAINE HCL (PF) 1 % IJ SOLN
5.0000 mL | INTRAMUSCULAR | Status: DC | PRN
  Filled 2017-04-29: qty 5

## 2017-04-29 MED ORDER — SODIUM CHLORIDE 0.9 % IV BOLUS (SEPSIS)
1000.0000 mL | INTRAVENOUS | Status: AC
  Administered 2017-04-29: 1000 mL via INTRAVENOUS

## 2017-04-29 NOTE — Progress Notes (Signed)
Hd start, pt alert, no c/o, stable, first HD tx, emergent from ED to ICU d/t hyperkalemia

## 2017-04-29 NOTE — ED Notes (Signed)
Potassium 7.4 reported verbally to Dr Karma Greaser, acknowledged.

## 2017-04-29 NOTE — Progress Notes (Signed)
Post hd vitals 

## 2017-04-29 NOTE — Telephone Encounter (Signed)
Cardiac Clearance faxed to Dr.Christopher End at this time.

## 2017-04-29 NOTE — H&P (Signed)
[facility] NAME: Carly Khan    MR#:  [id number]  DATE OF BIRTH:  September 04, 1951  DATE OF ADMISSION:  04/29/2017  PRIMARY CARE PHYSICIAN: Glendon Axe, MD   REQUESTING/REFERRING PHYSICIAN: Karma Greaser  CHIEF COMPLAINT:  Abnormal labs  HISTORY OF PRESENT ILLNESS:  Carly Khan  is a 66 y.o. female with a known history of Coronary artery disease disease status post quadruple CABG, chronic kidney disease stage III, history of lung cancer status post pneumonectomy on the left, chronic diverticulitis status post colostomy was recently moved from [place] to [place] and was seen by Dr. Adonis Huguenin for her colostomy reversal. She was referred to cardiology Dr. Saunders Revel for cardiac clearance. Initial lab work was abnormal and patient was sent to the ED, repeat labs in the emergency department has revealed potassium is 7.4 and sodium 131 creatinine 5.4, no EKG changes were noticed. Patient denies any chest pain or shortness of breath. Patient was started on Bactrim by Dr. Adonis Huguenin for sebaceous cyst on her back  PAST MEDICAL HISTORY:   Past Medical History:  Diagnosis Date  . CAD in native artery 02/28/2017  . Cancer of left lung ([place]) 2016   LUL lung resection  . CKD (chronic kidney disease) stage 3, GFR 30-59 ml/min 04/09/2017  . 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 Essential hypertension 04/09/2017  . History of sebaceous cyst 02/28/2017  . Intermittent vertigo 04/09/2017  . Itching 02/28/2017  . S/P CABG (coronary artery bypass graft) 02/26/2017    PAST SURGICAL HISTOIRY:   Past Surgical History:  Procedure Laterality Date  . cardiac bypass  1990  . carpel tunnel    . COLON SURGERY    . COLONOSCOPY    . CT guided needle placement    . EXPLORATORY LAPAROTOMY    . GALLBLADDER SURGERY    . loop ileostomy    . SIGMOID RESECTION / RECTOPEXY    . THORACOSCOPY    . vaginectomy      SOCIAL HISTORY:   Social History  Substance Use Topics  .  Smoking status: Current Every Day Smoker    Packs/day: 0.50    Years: 40.00  . Smokeless tobacco: Never Used  . Alcohol use No    FAMILY HISTORY:   Family History  Problem Relation Age of Onset  . Cancer Mother   . Heart disease Father   . Hypertension Father     DRUG ALLERGIES:   Allergies  Allergen Reactions  . Penicillin G Hives and Itching    REVIEW OF SYSTEMS:  CONSTITUTIONAL: No fever, fatigue or weakness.  EYES: No blurred or double vision.  EARS, NOSE, AND THROAT: No tinnitus or ear pain.  RESPIRATORY: No cough, shortness of breath, wheezing or hemoptysis.  CARDIOVASCULAR: No chest pain, orthopnea, edema.  GASTROINTESTINAL: Chronic colostomy No nausea, vomiting, diarrhea or abdominal pain.  GENITOURINARY: No dysuria, hematuria.  ENDOCRINE: No polyuria, nocturia,  HEMATOLOGY: No anemia, easy bruising or bleeding SKIN: No rash or lesion. MUSCULOSKELETAL: No joint pain or arthritis.   NEUROLOGIC: No tingling, numbness, weakness.  PSYCHIATRY: No anxiety or depression.   MEDICATIONS AT HOME:   Prior to Admission medications   Medication Sig Start Date End Date Taking? Authorizing Provider  apixaban (ELIQUIS) 5 MG TABS tablet Take 1 tablet (5 mg total) by mouth 2 (two) times daily. 04/28/17  Yes End, Harrell Gave, MD  aspirin EC 81 MG tablet Take 1 tablet by mouth daily. 04/09/17 04/09/18 Yes [provider]  metoprolol succinate (TOPROL-XL) 25 MG 24 hr tablet Take 1 tablet by mouth daily. 04/09/17 04/09/18 Yes [provider]  pravastatin (PRAVACHOL) 10 MG tablet Take 10 mg by mouth at bedtime.   Yes [provider]  sulfamethoxazole-trimethoprim (BACTRIM DS,SEPTRA DS) 800-160 MG tablet Take 1 tablet by mouth 2 (two) times daily. 04/24/17  Yes Clayburn Pert, MD  albuterol (PROVENTIL HFA;VENTOLIN HFA) 108 (90 Base) MCG/ACT inhaler Inhale 2 puffs into the lungs every 6 (six) hours as needed. 04/13/17 04/13/18  [provider]  meclizine  (ANTIVERT) 25 MG tablet Take 25 mg by mouth 3 (three) times daily as needed for dizziness.    [provider]      VITAL SIGNS:  Blood pressure (!) 103/59, pulse 67, temperature 98.3 F (36.8 C), temperature source Oral, resp. rate 17, height 5\' 4"  (1.626 m), weight 66.2 kg (146 lb), SpO2 99 %.  PHYSICAL EXAMINATION:  GENERAL:  66 y.o.-year-old patient lying in the bed with no acute distress.  EYES: Pupils equal, round, reactive to light and accommodation. No scleral icterus. Extraocular muscles intact.  HEENT: Head atraumatic, normocephalic. Oropharynx and nasopharynx clear.  NECK:  Supple, no jugular venous distention. No thyroid enlargement, no tenderness.  LUNGS: Normal breath sounds bilaterally, no wheezing, rales,rhonchi or crepitation. No use of accessory muscles of respiration.  CARDIOVASCULAR: S1, S2 normal. No murmurs, rubs, or gallops.  ABDOMEN: Chronic colostomy Soft, nontender, nondistended. Bowel sounds present. No organomegaly or mass.  EXTREMITIES: No pedal edema, cyanosis, or clubbing.  NEUROLOGIC: Cranial nerves II through XII are intact. Muscle strength 5/5 in all extremities. Sensation intact. Gait not checked.  PSYCHIATRIC: The patient is alert and oriented x 3.  SKIN: No obvious rash, lesion, or ulcer.   LABORATORY PANEL:   CBC  Recent Labs Lab 04/29/17 1228  WBC 9.3  HGB 14.2  HCT 42.1  PLT 237   ------------------------------------------------------------------------------------------------------------------  Chemistries   Recent Labs Lab 04/29/17 1514  NA 131*  K 7.4*  CL 110  CO2 11*  GLUCOSE 106*  BUN 91*  CREATININE 5.37*  CALCIUM 8.9  MG 2.7*   ------------------------------------------------------------------------------------------------------------------  Cardiac Enzymes No results for input(s): TROPONINI in the last 168  hours. ------------------------------------------------------------------------------------------------------------------  RADIOLOGY:  Ct Chest W Contrast  Addendum Date: 04/28/2017   ADDENDUM REPORT: 04/28/2017 11:46 ADDENDUM: These results will be called to the ordering clinician or representative by the Radiologist Assistant, and communication documented in the PACS or zVision Dashboard. Electronically Signed   By: Ilona Sorrel M.D.   On: 04/28/2017 11:46   Result Date: 04/28/2017 CLINICAL DATA:  History of left upper lobectomy in 2016 for lung cancer. 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 Patient is reportedly preoperative for reversal of ileostomy. Reported history of ventral hernia. Prior hysterectomy. EXAM: CT CHEST, ABDOMEN, AND PELVIS WITH CONTRAST TECHNIQUE: Multidetector CT imaging of the chest, abdomen and pelvis was performed following the standard protocol during bolus administration of intravenous contrast. CONTRAST:  37mL ISOVUE-300 IOPAMIDOL (ISOVUE-300) INJECTION 61% COMPARISON:  None. FINDINGS: CT CHEST FINDINGS Cardiovascular: Normal heart size. There is a low attenuation 1.0 cm filling defect in the left atrial appendage (series 2/ image 27). There is lipomatous hypertrophy of the interatrial septum. No significant pericardial fluid/thickening. Left main, left anterior descending, left circumflex and right coronary atherosclerosis status post CABG. Atherosclerotic nonaneurysmal thoracic aorta. Normal caliber pulmonary arteries. No central pulmonary emboli. Mediastinum/Nodes: Subcentimeter hypodense bilateral thyroid lobe nodules. Unremarkable esophagus. No pathologically enlarged axillary, mediastinal or hilar  lymph nodes. Lungs/Pleura: No pneumothorax. No pleural effusion. Status post left upper lobectomy. Moderate centrilobular emphysema with mild diffuse bronchial wall thickening . No acute consolidative airspace disease, lung  masses or significant pulmonary nodules. Musculoskeletal: No aggressive appearing focal osseous lesions. Mild thoracic spondylosis. Intact sternotomy wires. CT ABDOMEN PELVIS FINDINGS Hepatobiliary: Liver surface is diffusely finely irregular, suggesting cirrhosis. Diffuse hepatic steatosis. No liver mass. Cholecystectomy. Bile ducts are within normal post cholecystectomy limits with common bile duct diameter 8 mm. Pancreas: Normal, with no mass or duct dilation. Spleen: Normal size. No mass. Adrenals/Urinary Tract: No discrete adrenal nodules. No hydronephrosis. Exophytic 1.3 cm simple renal cyst in the medial interpolar right kidney. Additional subcentimeter hypodense renal cortical lesions scattered in both kidneys are too small to characterize and require no further follow-up. Normal bladder. Stomach/Bowel: Grossly normal stomach. Status post diverting loop ileostomy in the ventral right lower abdominal wall. Moderate parastomal hernia contains multiple right pelvic small bowel loops. No small bowel dilatation, wall thickening, pneumatosis or focal caliber transition. Oral contrast progresses to the rectum. Normal appendix. Status post subtotal sigmoid colectomy with intact appearing colorectal anastomosis. Minimal residual diverticulosis in the distal colon. Relatively collapsed large bowel with no large bowel wall thickening or pericolonic fat stranding. Vascular/Lymphatic: Atherosclerotic nonaneurysmal abdominal aorta. Patent portal, splenic, hepatic and renal veins. No pathologically enlarged lymph nodes in the abdomen or pelvis. Reproductive: Status post hysterectomy, with no abnormal findings at the vaginal cuff. No adnexal mass. Other: No pneumoperitoneum, ascites or focal fluid collection. Diastasis in the supraumbilical midline ventral abdominal wall. Musculoskeletal: No aggressive appearing focal osseous lesions. Marked lumbar spondylosis. IMPRESSION: 1. Low-attenuation 1.0 cm filling defect in the  left atrial appendage, suggestive of thrombus. Echocardiographic correlation advised. 2. No evidence of local tumor recurrence in the left lung status post left upper lobectomy. 3. No findings of metastatic disease in the chest, abdomen or pelvis. 4. 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 No evidence of bowel obstruction or ischemia. 5. Intact appearing colorectal anastomosis status post subtotal distal colectomy. Minimal residual uncomplicated diverticulosis in the distal colon. 6. Suspected cirrhosis. Consider hepatic elastography for further liver fibrosis risk stratification, as clinically warranted. Aortic Atherosclerosis (ICD10-I70.0) and Emphysema (ICD10-J43.9). Electronically Signed: By: Ilona Sorrel M.D. On: 04/28/2017 11:40   Ct Abdomen Pelvis W Contrast  Addendum Date: 04/28/2017   ADDENDUM REPORT: 04/28/2017 11:46 ADDENDUM: These results will be called to the ordering clinician or representative by the Radiologist Assistant, and communication documented in the PACS or zVision Dashboard. Electronically Signed   By: Ilona Sorrel M.D.   On: 04/28/2017 11:46   Result Date: 04/28/2017 CLINICAL DATA:  History of left upper lobectomy in 2016 for lung cancer. 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 Patient is reportedly preoperative for reversal of ileostomy. Reported history of ventral hernia. Prior hysterectomy. EXAM: CT CHEST, ABDOMEN, AND PELVIS WITH CONTRAST TECHNIQUE: Multidetector CT imaging of the chest, abdomen and pelvis was performed following the standard protocol during bolus administration of intravenous contrast. CONTRAST:  39mL ISOVUE-300 IOPAMIDOL (ISOVUE-300) INJECTION 61% COMPARISON:  None. FINDINGS: CT CHEST FINDINGS Cardiovascular: Normal heart size. There is a low attenuation 1.0 cm filling defect in the left atrial appendage  (series 2/ image 27). There is lipomatous hypertrophy of the interatrial septum. No significant pericardial fluid/thickening. Left main, left anterior descending, left circumflex and right coronary atherosclerosis status post CABG. Atherosclerotic nonaneurysmal thoracic aorta.  Normal caliber pulmonary arteries. No central pulmonary emboli. Mediastinum/Nodes: Subcentimeter hypodense bilateral thyroid lobe nodules. Unremarkable esophagus. No pathologically enlarged axillary, mediastinal or hilar lymph nodes. Lungs/Pleura: No pneumothorax. No pleural effusion. Status post left upper lobectomy. Moderate centrilobular emphysema with mild diffuse bronchial wall thickening . No acute consolidative airspace disease, lung masses or significant pulmonary nodules. Musculoskeletal: No aggressive appearing focal osseous lesions. Mild thoracic spondylosis. Intact sternotomy wires. CT ABDOMEN PELVIS FINDINGS Hepatobiliary: Liver surface is diffusely finely irregular, suggesting cirrhosis. Diffuse hepatic steatosis. No liver mass. Cholecystectomy. Bile ducts are within normal post cholecystectomy limits with common bile duct diameter 8 mm. Pancreas: Normal, with no mass or duct dilation. Spleen: Normal size. No mass. Adrenals/Urinary Tract: No discrete adrenal nodules. No hydronephrosis. Exophytic 1.3 cm simple renal cyst in the medial interpolar right kidney. Additional subcentimeter hypodense renal cortical lesions scattered in both kidneys are too small to characterize and require no further follow-up. Normal bladder. Stomach/Bowel: Grossly normal stomach. Status post diverting loop ileostomy in the ventral right lower abdominal wall. Moderate parastomal hernia contains multiple right pelvic small bowel loops. No small bowel dilatation, wall thickening, pneumatosis or focal caliber transition. Oral contrast progresses to the rectum. Normal appendix. Status post subtotal sigmoid colectomy with intact appearing colorectal  anastomosis. Minimal residual diverticulosis in the distal colon. Relatively collapsed large bowel with no large bowel wall thickening or pericolonic fat stranding. Vascular/Lymphatic: Atherosclerotic nonaneurysmal abdominal aorta. Patent portal, splenic, hepatic and renal veins. No pathologically enlarged lymph nodes in the abdomen or pelvis. Reproductive: Status post hysterectomy, with no abnormal findings at the vaginal cuff. No adnexal mass. Other: No pneumoperitoneum, ascites or focal fluid collection. Diastasis in the supraumbilical midline ventral abdominal wall. Musculoskeletal: No aggressive appearing focal osseous lesions. Marked lumbar spondylosis. IMPRESSION: 1. Low-attenuation 1.0 cm filling defect in the left atrial appendage, suggestive of thrombus. Echocardiographic correlation advised. 2. No evidence of local tumor recurrence in the left lung status post left upper lobectomy. 3. No findings of metastatic disease in the chest, abdomen or pelvis. 4. 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 No evidence of bowel obstruction or ischemia. 5. Intact appearing colorectal anastomosis status post subtotal distal colectomy. Minimal residual uncomplicated diverticulosis in the distal colon. 6. Suspected cirrhosis. Consider hepatic elastography for further liver fibrosis risk stratification, as clinically warranted. Aortic Atherosclerosis (ICD10-I70.0) and Emphysema (ICD10-J43.9). Electronically Signed: By: Ilona Sorrel M.D. On: 04/28/2017 11:40    EKG:   Orders placed or performed during the hospital encounter of 04/29/17  . ED EKG  . ED EKG    IMPRESSION AND PLAN:   Carly Khan  is a 66 y.o. female with a known history of Coronary artery disease disease status post quadruple CABG, chronic kidney disease stage III, history of lung cancer status post pneumonectomy on the left, chronic  diverticulitis status post colostomy was recently moved from [place] to [place] and was seen by Dr. Adonis Huguenin for her colostomy reversal. She was referred to cardiology Dr. Saunders Revel for cardiac clearance. Initial lab work was abnormal and patient was sent to the ED, repeat labs in the emergency department has revealed potassium is 7.4 and sodium 131 creatinine 5.4, no EKG changes were noticed.   #Acute kidney injury with hyperkalemia secondary to ATN could be from Bactrim use Admit to telemetry Patient has received calcium gluconate in the ED No EKG changes  Patient requires urgent hemodialysis after placing a temporary dialysis catheter,  intensivist is going to place the temporary hemodialysis catheter and patient will get hemodialysis. Nephrologist is aware Avoid nephrotoxins Monitor intake and output Renal ultrasound  #Coronary artery disease status post CABG Patient denies any chest pain  Continue home medications aspirin, beta blocker  #cardiac thrombus on imaging Continue Eliquis, pharmacy to dose  #Hyponatremia Patient is getting hemodialysis  #Tobacco abuse disorder counseled patient to quit smoking Will start nicotine patch from tomorrow     All the records are reviewed and case discussed with ED provider. Management plans discussed with the patient, family and they are in agreement.  CODE STATUS: Full code, daughter is the healthcare power of attorney  TOTAL CRITICAL CARE TIME TAKING CARE OF THIS PATIENT: 72minutes.   Note: This dictation was prepared with Dragon dictation along with smaller phrase technology. Any transcriptional errors that result from this process are unintentional.  Nicholes Mango M.D on 04/29/2017 at 5:55 PM  Between 7am to 6pm - Pager - [phone]  After 6pm go to [email or web address] - password [email or web address] [facility]  [facility]  Office  [phone]  CC: Primary care physician; Glendon Axe, MD

## 2017-04-29 NOTE — Procedures (Signed)
Hemodialysis Catheter Insertion Procedure Note Carly Khan [id number] 1951/01/29  Procedure: Insertion of Hemodialysis Catheter Indications: Hyperkalemia  Procedure Details Consent: Risks of procedure as well as the alternatives and risks of each were explained to the (patient/caregiver).  Consent for procedure obtained. Time Out: Verified patient identification, verified procedure, site/side was marked, verified correct patient position, special equipment/implants available, medications/allergies/relevent history reviewed, required imaging and test results available.  Performed  Maximum sterile technique was used including antiseptics, cap, gloves, gown, hand hygiene, mask and sheet. Skin prep: Chlorhexidine; local anesthetic administered A antimicrobial bonded/coated double lumen catheter was placed in the right femoral vein due to emergent situation using the Seldinger technique.  Evaluation Blood flow good Complications: No apparent complications Patient did tolerate procedure well.   Procedure performed under direct ultrasound guidance for real time vessel cannulation.      [facility]

## 2017-04-29 NOTE — ED Triage Notes (Signed)
States she is doing pre-labs for colostomy reversal, colostomy from diverticulitis, states she had blood work done this AM and was told her k-dur was over 7, denies any hx of same, denies any pain, pt was started on eliquis yestersday

## 2017-04-29 NOTE — Progress Notes (Signed)
Post hd assessment 

## 2017-04-29 NOTE — Telephone Encounter (Signed)
Dr End reviewed patient's potassium and lab work in full. Advised for patient to go to the ED for evaluation and treatment.  S/w patient. She was almost home from testing and lab work from the hospital. She asked if she had to go today. Advised that it was pertinent to her health that she go to the ED today for evaluation and not to wait until tomorrow. She verbalized understanding and will proceed to ED shortly today.

## 2017-04-29 NOTE — Consult Note (Signed)
[facility] CONSULT NOTE    Date: 04/29/2017                  Patient Name:  Carly Khan  MRN: [id number]  DOB: 07-Jul-1951  Age / Sex: 66 y.o., female         PCP: Glendon Axe, MD                 Service Requesting Consult: ED                 Reason for Consult: Hyperkalemia, acute renal failure            History of Present Illness: Patient is a 66 y.o. female with a PMHx of asthma, coronary artery disease s/p CABG, epistaxis, COPD, hyperlipidemia, hypertension, peripheral vascular disease status post aortobifemoral bypass, who was admitted to [facility] on 04/29/2017 for evaluation and management of acute renal failure and severe hyperkalemia. The patient was having preoperative labs for reversal of diverting colostomy. On these labs she was noted as having severe acute renal failure and hyperkalemia.  The patient's baseline creatinine is 1.7. On initial preoperative labs earlier today BUN was 89 with a creatinine of 5.25. This was rechecked in the emergency department and BUN was 91 with a creatinine of 5.3 and serum potassium of 7.4. Serum bicarbonate was also quite low at 11. She denies unusually high output from her colostomy. She reports that she normally empties her bag 4-5 times a day which is normal for her. She's had rather diminished appetite recently however. She was also recently started on Bactrim but states that she only took 1 or 2 doses of this medication. She denies ingestion of NSAIDs.   Medications: Outpatient medications:  (Not in a hospital admission)  Current medications: Current Facility-Administered Medications  Medication Dose Route Frequency Provider Last Rate Last Dose  . calcium gluconate 1 g in sodium chloride 0.9 % 100 mL IVPB  1 g Intravenous Once Hinda Kehr, MD       Current Outpatient Prescriptions  Medication Sig Dispense Refill  . apixaban (ELIQUIS) 5 MG TABS tablet Take 1 tablet (5 mg total) by mouth 2 (two) times daily.  180 tablet 3  . aspirin EC 81 MG tablet Take 1 tablet by mouth daily.    . metoprolol succinate (TOPROL-XL) 25 MG 24 hr tablet Take 1 tablet by mouth daily.    . pravastatin (PRAVACHOL) 10 MG tablet Take 10 mg by mouth at bedtime.    . sulfamethoxazole-trimethoprim (BACTRIM DS,SEPTRA DS) 800-160 MG tablet Take 1 tablet by mouth 2 (two) times daily. 28 tablet 0  . albuterol (PROVENTIL HFA;VENTOLIN HFA) 108 (90 Base) MCG/ACT inhaler Inhale 2 puffs into the lungs every 6 (six) hours as needed.    . meclizine (ANTIVERT) 25 MG tablet Take 25 mg by mouth 3 (three) times daily as needed for dizziness.        Allergies: Allergies  Allergen Reactions  . Penicillin G Hives and Itching      Past Medical History: Past Medical History:  Diagnosis Date  . CAD in native artery 02/28/2017  . Cancer of left lung ([place]) 2016   LUL lung resection  . CKD (chronic kidney disease) stage 3, GFR 30-59 ml/min 04/09/2017  . 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 Essential hypertension 04/09/2017  . History of sebaceous cyst 02/28/2017  . Intermittent vertigo 04/09/2017  . Itching 02/28/2017  . S/P CABG (coronary artery bypass graft) 02/26/2017     Past  Surgical History: Past Surgical History:  Procedure Laterality Date  . cardiac bypass  1990  . carpel tunnel    . COLON SURGERY    . COLONOSCOPY    . CT guided needle placement    . EXPLORATORY LAPAROTOMY    . GALLBLADDER SURGERY    . loop ileostomy    . SIGMOID RESECTION / RECTOPEXY    . THORACOSCOPY    . vaginectomy       Family History: Family History  Problem Relation Age of Onset  . Cancer Mother   . Heart disease Father   . Hypertension Father      Social History: Social History   Social History  . Marital status: Divorced    Spouse name: N/A  . Number of children: N/A  . Years of education: N/A   Occupational History  . Not on file.   Social History Main Topics  . Smoking status: Current Every Day Smoker    Packs/day: 0.50    Years:  40.00  . Smokeless tobacco: Never Used  . Alcohol use No  . Drug use: No  . Sexual activity: No   Other Topics Concern  . Not on file   Social History Narrative  . No narrative on file     Review of Systems: Review of Systems  Constitutional: Positive for malaise/fatigue. Negative for chills, fever and weight loss.  HENT: Negative for congestion, hearing loss and sinus pain.   Eyes: Negative for blurred vision and double vision.  Respiratory: Negative for cough, hemoptysis and sputum production.   Cardiovascular: Negative for chest pain and palpitations.  Gastrointestinal: Positive for nausea and vomiting. Negative for abdominal pain and heartburn.  Genitourinary: Negative for dysuria and urgency.  Musculoskeletal: Negative for falls and myalgias.  Skin: Negative for itching and rash.  Neurological: Negative for dizziness and headaches.  Endo/Heme/Allergies: Negative for polydipsia. Does not bruise/bleed easily.  Psychiatric/Behavioral: Negative for depression and substance abuse.     Vital Signs: Blood pressure (!) 103/59, pulse 67, temperature 98.3 F (36.8 C), temperature source Oral, resp. rate 17, height 5\' 4"  (1.626 m), weight 66.2 kg (146 lb), SpO2 99 %.  Weight trends: Filed Weights   04/29/17 1511  Weight: 66.2 kg (146 lb)    Physical Exam: General: NAD, resting comfortably in bed  Head: Normocephalic, atraumatic.  Eyes: Anicteric, EOMI  Nose: Mucous membranes moist, not inflammed, nonerythematous.  Throat: Oropharynx nonerythematous, no exudate appreciated. Poor dentition.  Neck: Supple, trachea midline.  Lungs:  Normal respiratory effort. Clear to auscultation BL without crackles or wheezes.  Heart: RRR. S1 and S2 normal without gallop, murmur, or rubs.  Abdomen:  BS normoactive. Soft, Nondistended, non-tender. Colostomy in place  Extremities: No pretibial edema.  Neurologic: A&O X3, Motor strength is 5/5 in the all 4 extremities  Skin: No visible  rashes, scars.    Lab results: Basic Metabolic Panel:  Recent Labs Lab 04/29/17 1228 04/29/17 1514  NA 128* 131*  K 7.3* 7.4*  CL 106 110  CO2 13* 11*  GLUCOSE 97 106*  BUN 89* 91*  CREATININE 5.25* 5.37*  CALCIUM 8.9 8.9  MG  --  2.7*    Liver Function Tests: No results for input(s): AST, ALT, ALKPHOS, BILITOT, PROT, ALBUMIN in the last 168 hours. No results for input(s): LIPASE, AMYLASE in the last 168 hours. No results for input(s): AMMONIA in the last 168 hours.  CBC:  Recent Labs Lab 04/29/17 1228  WBC 9.3  NEUTROABS 6.6*  HGB 14.2  HCT 42.1  MCV 97.6  PLT 237    Cardiac Enzymes: No results for input(s): CKTOTAL, CKMB, CKMBINDEX, TROPONINI in the last 168 hours.  BNP: Invalid input(s): POCBNP  CBG: No results for input(s): GLUCAP in the last 168 hours.  Microbiology: No results found for this or any previous visit.  Coagulation Studies:  Recent Labs  04/29/17 1228  LABPROT 16.3*  INR 1.30    Urinalysis: No results for input(s): COLORURINE, LABSPEC, PHURINE, GLUCOSEU, HGBUR, BILIRUBINUR, KETONESUR, PROTEINUR, UROBILINOGEN, NITRITE, LEUKOCYTESUR in the last 72 hours.  Invalid input(s): APPERANCEUR    Imaging: Ct Chest W Contrast  Addendum Date: 04/28/2017   ADDENDUM REPORT: 04/28/2017 11:46 ADDENDUM: These results will be called to the ordering clinician or representative by the Radiologist Assistant, and communication documented in the PACS or zVision Dashboard. Electronically Signed   By: Ilona Sorrel M.D.   On: 04/28/2017 11:46   Result Date: 04/28/2017 CLINICAL DATA:  History of left upper lobectomy in 2016 for lung cancer. 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 Patient is reportedly preoperative for reversal of ileostomy. Reported history of ventral hernia. Prior hysterectomy. EXAM: CT CHEST, ABDOMEN, AND PELVIS WITH CONTRAST TECHNIQUE: Multidetector CT imaging of the chest, abdomen  and pelvis was performed following the standard protocol during bolus administration of intravenous contrast. CONTRAST:  51mL ISOVUE-300 IOPAMIDOL (ISOVUE-300) INJECTION 61% COMPARISON:  None. FINDINGS: CT CHEST FINDINGS Cardiovascular: Normal heart size. There is a low attenuation 1.0 cm filling defect in the left atrial appendage (series 2/ image 27). There is lipomatous hypertrophy of the interatrial septum. No significant pericardial fluid/thickening. Left main, left anterior descending, left circumflex and right coronary atherosclerosis status post CABG. Atherosclerotic nonaneurysmal thoracic aorta. Normal caliber pulmonary arteries. No central pulmonary emboli. Mediastinum/Nodes: Subcentimeter hypodense bilateral thyroid lobe nodules. Unremarkable esophagus. No pathologically enlarged axillary, mediastinal or hilar lymph nodes. Lungs/Pleura: No pneumothorax. No pleural effusion. Status post left upper lobectomy. Moderate centrilobular emphysema with mild diffuse bronchial wall thickening . No acute consolidative airspace disease, lung masses or significant pulmonary nodules. Musculoskeletal: No aggressive appearing focal osseous lesions. Mild thoracic spondylosis. Intact sternotomy wires. CT ABDOMEN PELVIS FINDINGS Hepatobiliary: Liver surface is diffusely finely irregular, suggesting cirrhosis. Diffuse hepatic steatosis. No liver mass. Cholecystectomy. Bile ducts are within normal post cholecystectomy limits with common bile duct diameter 8 mm. Pancreas: Normal, with no mass or duct dilation. Spleen: Normal size. No mass. Adrenals/Urinary Tract: No discrete adrenal nodules. No hydronephrosis. Exophytic 1.3 cm simple renal cyst in the medial interpolar right kidney. Additional subcentimeter hypodense renal cortical lesions scattered in both kidneys are too small to characterize and require no further follow-up. Normal bladder. Stomach/Bowel: Grossly normal stomach. Status post diverting loop ileostomy in the  ventral right lower abdominal wall. Moderate parastomal hernia contains multiple right pelvic small bowel loops. No small bowel dilatation, wall thickening, pneumatosis or focal caliber transition. Oral contrast progresses to the rectum. Normal appendix. Status post subtotal sigmoid colectomy with intact appearing colorectal anastomosis. Minimal residual diverticulosis in the distal colon. Relatively collapsed large bowel with no large bowel wall thickening or pericolonic fat stranding. Vascular/Lymphatic: Atherosclerotic nonaneurysmal abdominal aorta. Patent portal, splenic, hepatic and renal veins. No pathologically enlarged lymph nodes in the abdomen or pelvis. Reproductive: Status post hysterectomy, with no abnormal findings at the vaginal cuff. No adnexal mass. Other: No pneumoperitoneum, ascites or focal fluid collection. Diastasis in the supraumbilical midline ventral abdominal wall. Musculoskeletal: No aggressive appearing focal osseous lesions. Marked lumbar spondylosis.  IMPRESSION: 1. Low-attenuation 1.0 cm filling defect in the left atrial appendage, suggestive of thrombus. Echocardiographic correlation advised. 2. No evidence of local tumor recurrence in the left lung status post left upper lobectomy. 3. No findings of metastatic disease in the chest, abdomen or pelvis. 4. 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 No evidence of bowel obstruction or ischemia. 5. Intact appearing colorectal anastomosis status post subtotal distal colectomy. Minimal residual uncomplicated diverticulosis in the distal colon. 6. Suspected cirrhosis. Consider hepatic elastography for further liver fibrosis risk stratification, as clinically warranted. Aortic Atherosclerosis (ICD10-I70.0) and Emphysema (ICD10-J43.9). Electronically Signed: By: Ilona Sorrel M.D. On: 04/28/2017 11:40   Ct Abdomen Pelvis W Contrast  Addendum  Date: 04/28/2017   ADDENDUM REPORT: 04/28/2017 11:46 ADDENDUM: These results will be called to the ordering clinician or representative by the Radiologist Assistant, and communication documented in the PACS or zVision Dashboard. Electronically Signed   By: Ilona Sorrel M.D.   On: 04/28/2017 11:46   Result Date: 04/28/2017 CLINICAL DATA:  History of left upper lobectomy in 2016 for lung cancer. 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 Patient is reportedly preoperative for reversal of ileostomy. Reported history of ventral hernia. Prior hysterectomy. EXAM: CT CHEST, ABDOMEN, AND PELVIS WITH CONTRAST TECHNIQUE: Multidetector CT imaging of the chest, abdomen and pelvis was performed following the standard protocol during bolus administration of intravenous contrast. CONTRAST:  42mL ISOVUE-300 IOPAMIDOL (ISOVUE-300) INJECTION 61% COMPARISON:  None. FINDINGS: CT CHEST FINDINGS Cardiovascular: Normal heart size. There is a low attenuation 1.0 cm filling defect in the left atrial appendage (series 2/ image 27). There is lipomatous hypertrophy of the interatrial septum. No significant pericardial fluid/thickening. Left main, left anterior descending, left circumflex and right coronary atherosclerosis status post CABG. Atherosclerotic nonaneurysmal thoracic aorta. Normal caliber pulmonary arteries. No central pulmonary emboli. Mediastinum/Nodes: Subcentimeter hypodense bilateral thyroid lobe nodules. Unremarkable esophagus. No pathologically enlarged axillary, mediastinal or hilar lymph nodes. Lungs/Pleura: No pneumothorax. No pleural effusion. Status post left upper lobectomy. Moderate centrilobular emphysema with mild diffuse bronchial wall thickening . No acute consolidative airspace disease, lung masses or significant pulmonary nodules. Musculoskeletal: No aggressive appearing focal osseous lesions. Mild thoracic spondylosis. Intact sternotomy wires. CT ABDOMEN PELVIS  FINDINGS Hepatobiliary: Liver surface is diffusely finely irregular, suggesting cirrhosis. Diffuse hepatic steatosis. No liver mass. Cholecystectomy. Bile ducts are within normal post cholecystectomy limits with common bile duct diameter 8 mm. Pancreas: Normal, with no mass or duct dilation. Spleen: Normal size. No mass. Adrenals/Urinary Tract: No discrete adrenal nodules. No hydronephrosis. Exophytic 1.3 cm simple renal cyst in the medial interpolar right kidney. Additional subcentimeter hypodense renal cortical lesions scattered in both kidneys are too small to characterize and require no further follow-up. Normal bladder. Stomach/Bowel: Grossly normal stomach. Status post diverting loop ileostomy in the ventral right lower abdominal wall. Moderate parastomal hernia contains multiple right pelvic small bowel loops. No small bowel dilatation, wall thickening, pneumatosis or focal caliber transition. Oral contrast progresses to the rectum. Normal appendix. Status post subtotal sigmoid colectomy with intact appearing colorectal anastomosis. Minimal residual diverticulosis in the distal colon. Relatively collapsed large bowel with no large bowel wall thickening or pericolonic fat stranding. Vascular/Lymphatic: Atherosclerotic nonaneurysmal abdominal aorta. Patent portal, splenic, hepatic and renal veins. No pathologically enlarged lymph nodes in the abdomen or pelvis. Reproductive: Status post hysterectomy, with no abnormal findings at the vaginal cuff. No adnexal mass. Other: No pneumoperitoneum,  ascites or focal fluid collection. Diastasis in the supraumbilical midline ventral abdominal wall. Musculoskeletal: No aggressive appearing focal osseous lesions. Marked lumbar spondylosis. IMPRESSION: 1. Low-attenuation 1.0 cm filling defect in the left atrial appendage, suggestive of thrombus. Echocardiographic correlation advised. 2. No evidence of local tumor recurrence in the left lung status post left upper lobectomy.  3. No findings of metastatic disease in the chest, abdomen or pelvis. 4. 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 No evidence of bowel obstruction or ischemia. 5. Intact appearing colorectal anastomosis status post subtotal distal colectomy. Minimal residual uncomplicated diverticulosis in the distal colon. 6. Suspected cirrhosis. Consider hepatic elastography for further liver fibrosis risk stratification, as clinically warranted. Aortic Atherosclerosis (ICD10-I70.0) and Emphysema (ICD10-J43.9). Electronically Signed: By: Ilona Sorrel M.D. On: 04/28/2017 11:40      Assessment & Plan: Pt is a 66 y.o. female with a PMHx of asthma, coronary artery disease s/p CABG, epistaxis, COPD, hyperlipidemia, hypertension, peripheral vascular disease status post aortobifemoral bypass, who was admitted to [facility] on 04/29/2017 for evaluation and management of acute renal failure and severe hyperkalemia.   1. Acute renal failure/chronic kidney disease stage III Baseline creatinine 1.7.  Cause of acute renal failure currently unclear. Patient was on Bactrim 1-2 doses. In addition she's had diminished by mouth intake per her report. Given acute renal failure and hyperkalemia a trial of temporal dialysis is indicated at this time. We have requested temporary dialysis catheter to be placed at this point in time. Thereafter we will plan for a short course of dialysis. We will reassess the patient for need of dialysis tomorrow as well. No hydronephrosis noted on CT scan of the abdomen and pelvis.  2. Hyperkalemia. Patient has severe hyperkalemia. Serum potassium 7.4. We will proceed with dialysis as above using 1K bath.  3. Metabolic acidosis. Serum bicarbonate quite low at 11. This should be corrected with hemodialysis.  4. Hypotension. Blood pressure currently 103/59. Agree with plans for IV fluid hydration.

## 2017-04-29 NOTE — Progress Notes (Signed)
*  PRELIMINARY RESULTS* Echocardiogram 2D Echocardiogram has been performed.  Carly Khan 04/29/2017, 1:32 PM

## 2017-04-29 NOTE — Consult Note (Signed)
Name: Carly Khan MRN: [id number] DOB: Mar 16, 1951     CONSULTATION DATE: 04/29/2017  REFERRING MD : Reva Bores  CHIEF COMPLAINT:  Abnormal labs  STUDIES:  CT of the chest 04/28/17 Liver shows evidence of cirrhosis Evidence of emphysema I have Independently reviewed images of  CT chest  on 04/29/2017       HISTORY OF PRESENT ILLNESS:   66 y.o. 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  She established care with Dr. Adonis Huguenin, and he asked her to obtain outpatient clearance from a number of specialists including cardiology before he would be able to take her to surgery.  She had an appointment yesterday with Dr. and the cardiologist who started her on Eliquis and ordered labs that were performed today.  She was contacted today by the lab and told to come to the emergency department for elevated potassium greater than 7.  She reports that she has been urinating but she thinks less then usual.  She has been in her usual state of health (which she laughingly states is not very good) until last night and she was up during the night with several episodes of vomiting.  She denies fever/chills, chest pain, shortness of breath, abdominal pain.  She states that her colostomy has been functioning normally.  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  has a past surgical history that includes cardiac bypass (1990); carpel tunnel; Colon surgery; Colonoscopy; Exploratory laparotomy; Sigmoid resection / rectopexy; vaginectomy; loop ileostomy; Gallbladder surgery; CT guided needle placement; and Thoracoscopy. Prior to Admission medications   Medication Sig Start Date End Date Taking? Authorizing Provider  apixaban (ELIQUIS) 5 MG TABS tablet Take 1 tablet (5 mg total) by mouth 2 (two) times daily. 04/28/17  Yes End, Harrell Gave, MD  aspirin EC 81 MG tablet Take 1 tablet by mouth daily. 04/09/17 04/09/18 Yes [provider]  metoprolol succinate (TOPROL-XL) 25 MG 24 hr tablet Take 1 tablet by mouth daily. 04/09/17 04/09/18 Yes [provider]  pravastatin (PRAVACHOL) 10 MG tablet Take 10 mg by mouth at bedtime.   Yes [provider]  sulfamethoxazole-trimethoprim (BACTRIM DS,SEPTRA DS) 800-160 MG tablet Take 1 tablet by mouth 2 (two) times daily. 04/24/17  Yes Clayburn Pert, MD  albuterol (PROVENTIL HFA;VENTOLIN HFA) 108 (90 Base) MCG/ACT inhaler Inhale 2 puffs into the lungs every 6 (six) hours as needed. 04/13/17 04/13/18  [provider]  meclizine (ANTIVERT) 25 MG tablet Take 25 mg by mouth 3 (three) times daily as needed for dizziness.    [provider]   Allergies  Allergen Reactions  . Penicillin G Hives and Itching    FAMILY HISTORY:  family history includes Cancer in her mother; Heart disease in her father; Hypertension in her father. SOCIAL HISTORY:  reports that she has been smoking.  She has a 20.00 pack-year smoking history. She has never used smokeless  tobacco. She reports that she does not drink alcohol or use drugs.  REVIEW OF SYSTEMS:   Constitutional: Negative for fever, chills, weight loss, malaise/fatigue and diaphoresis.  HENT: Negative for hearing loss, ear pain, nosebleeds, congestion, sore throat, neck pain, tinnitus and ear discharge.   Eyes: Negative for blurred vision, double vision, photophobia, pain, discharge and redness.  Respiratory: Negative for cough, hemoptysis, sputum production, shortness of breath, wheezing and stridor.   Cardiovascular: Negative for chest pain, palpitations, orthopnea, claudication, leg swelling and PND.  Gastrointestinal: Negative for heartburn, nausea, vomiting, abdominal pain, diarrhea, constipation, blood in stool and melena.  Genitourinary: Negative for dysuria, urgency, frequency, hematuria and flank pain.  Musculoskeletal: Negative for myalgias, back pain, joint pain and falls.  Skin: Negative for itching and rash.  Neurological: Negative for dizziness, tingling, tremors, sensory change, speech change, focal weakness, seizures, loss of consciousness, weakness and headaches.  Endo/Heme/Allergies: Negative for environmental allergies and polydipsia. Does not bruise/bleed easily.  ALL OTHER ROS ARE NEGATIVE    VITAL SIGNS: Temp:  [98.3 F (36.8 C)] 98.3 F (36.8 C) (06/27 1511) Pulse Rate:  [65-73] 67 (06/27 1715) Resp:  [17-23] 17 (06/27 1715) BP: (91-103)/(48-61) 103/59 (06/27 1700) SpO2:  [97 %-100 %] 99 % (06/27 1715) Weight:  [146 lb (66.2 kg)] 146 lb (66.2 kg) (06/27 1511)  Physical Examination:   GENERAL:NAD, no fevers, chills, no weakness no fatigue HEAD: Normocephalic, atraumatic.  EYES: Pupils equal, round, reactive to light. Extraocular muscles intact. No scleral icterus.  MOUTH: Moist mucosal membrane.   EAR, NOSE, THROAT: Clear without exudates. No external lesions. Very poor dentition NECK: Supple. No thyromegaly. No nodules. No JVD.  PULMONARY:CTA B/L no wheezes, no  crackles, + rhonchi CARDIOVASCULAR: S1 and S2. Regular rate and rhythm. No murmurs, rubs, or gallops. No edema.  GASTROINTESTINAL: Soft, nontender, nondistended. No masses. Positive bowel sounds.  MUSCULOSKELETAL: No swelling, clubbing, or edema. Range of motion full in all extremities.  NEUROLOGIC: Cranial nerves II through XII are intact. No gross focal neurological deficits.  SKIN: No ulceration, lesions, rashes, or cyanosis. Skin warm and dry. Turgor intact.  PSYCHIATRIC: Mood, affect within normal limits. The patient is awake, alert and oriented x 3. Insight, judgment intact.       Recent Labs Lab 04/29/17 1228 04/29/17 1514  NA 128* 131*  K 7.3* 7.4*  CL 106 110  CO2 13* 11*  BUN 89* 91*  CREATININE 5.25* 5.37*  GLUCOSE 97 106*    Recent Labs Lab 04/29/17 1228  HGB 14.2  HCT 42.1  WBC 9.3  PLT 237   Ct Chest W Contrast  Addendum Date: 04/28/2017   ADDENDUM REPORT: 04/28/2017 11:46 ADDENDUM: These results will be called to the ordering clinician or representative by the Radiologist Assistant, and communication documented in the PACS or zVision Dashboard. Electronically Signed   By: Ilona Sorrel M.D.   On: 04/28/2017 11:46   Result Date: 04/28/2017 CLINICAL DATA:  History of left upper lobectomy in 2016 for lung cancer. 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 Patient is reportedly preoperative for reversal of ileostomy. Reported history of ventral hernia. Prior hysterectomy. EXAM:  CT CHEST, ABDOMEN, AND PELVIS WITH CONTRAST TECHNIQUE: Multidetector CT imaging of the chest, abdomen and pelvis was performed following the standard protocol during bolus administration of intravenous contrast. CONTRAST:  42mL ISOVUE-300 IOPAMIDOL (ISOVUE-300) INJECTION 61% COMPARISON:  None. FINDINGS: CT CHEST FINDINGS Cardiovascular: Normal heart size. There is a low attenuation 1.0 cm filling defect in the left atrial appendage (series 2/ image  27). There is lipomatous hypertrophy of the interatrial septum. No significant pericardial fluid/thickening. Left main, left anterior descending, left circumflex and right coronary atherosclerosis status post CABG. Atherosclerotic nonaneurysmal thoracic aorta. Normal caliber pulmonary arteries. No central pulmonary emboli. Mediastinum/Nodes: Subcentimeter hypodense bilateral thyroid lobe nodules. Unremarkable esophagus. No pathologically enlarged axillary, mediastinal or hilar lymph nodes. Lungs/Pleura: No pneumothorax. No pleural effusion. Status post left upper lobectomy. Moderate centrilobular emphysema with mild diffuse bronchial wall thickening . No acute consolidative airspace disease, lung masses or significant pulmonary nodules. Musculoskeletal: No aggressive appearing focal osseous lesions. Mild thoracic spondylosis. Intact sternotomy wires. CT ABDOMEN PELVIS FINDINGS Hepatobiliary: Liver surface is diffusely finely irregular, suggesting cirrhosis. Diffuse hepatic steatosis. No liver mass. Cholecystectomy. Bile ducts are within normal post cholecystectomy limits with common bile duct diameter 8 mm. Pancreas: Normal, with no mass or duct dilation. Spleen: Normal size. No mass. Adrenals/Urinary Tract: No discrete adrenal nodules. No hydronephrosis. Exophytic 1.3 cm simple renal cyst in the medial interpolar right kidney. Additional subcentimeter hypodense renal cortical lesions scattered in both kidneys are too small to characterize and require no further follow-up. Normal bladder. Stomach/Bowel: Grossly normal stomach. Status post diverting loop ileostomy in the ventral right lower abdominal wall. Moderate parastomal hernia contains multiple right pelvic small bowel loops. No small bowel dilatation, wall thickening, pneumatosis or focal caliber transition. Oral contrast progresses to the rectum. Normal appendix. Status post subtotal sigmoid colectomy with intact appearing colorectal anastomosis. Minimal  residual diverticulosis in the distal colon. Relatively collapsed large bowel with no large bowel wall thickening or pericolonic fat stranding. Vascular/Lymphatic: Atherosclerotic nonaneurysmal abdominal aorta. Patent portal, splenic, hepatic and renal veins. No pathologically enlarged lymph nodes in the abdomen or pelvis. Reproductive: Status post hysterectomy, with no abnormal findings at the vaginal cuff. No adnexal mass. Other: No pneumoperitoneum, ascites or focal fluid collection. Diastasis in the supraumbilical midline ventral abdominal wall. Musculoskeletal: No aggressive appearing focal osseous lesions. Marked lumbar spondylosis. IMPRESSION: 1. Low-attenuation 1.0 cm filling defect in the left atrial appendage, suggestive of thrombus. Echocardiographic correlation advised. 2. No evidence of local tumor recurrence in the left lung status post left upper lobectomy. 3. No findings of metastatic disease in the chest, abdomen or pelvis. 4. 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 No evidence of bowel obstruction or ischemia. 5. Intact appearing colorectal anastomosis status post subtotal distal colectomy. Minimal residual uncomplicated diverticulosis in the distal colon. 6. Suspected cirrhosis. Consider hepatic elastography for further liver fibrosis risk stratification, as clinically warranted. Aortic Atherosclerosis (ICD10-I70.0) and Emphysema (ICD10-J43.9). Electronically Signed: By: Ilona Sorrel M.D. On: 04/28/2017 11:40   Ct Abdomen Pelvis W Contrast  Addendum Date: 04/28/2017   ADDENDUM REPORT: 04/28/2017 11:46 ADDENDUM: These results will be called to the ordering clinician or representative by the Radiologist Assistant, and communication documented in the PACS or zVision Dashboard. Electronically Signed   By: Ilona Sorrel M.D.   On: 04/28/2017 11:46   Result Date: 04/28/2017 CLINICAL DATA:   History of left upper lobectomy in 2016 for lung cancer. History of diverting  loop ileostomy related to repair of colovaginal fistula/diverticulitis. Patient is reportedly preoperative for reversal of ileostomy. Reported history of ventral hernia. Prior hysterectomy. EXAM: CT CHEST, ABDOMEN, AND PELVIS WITH CONTRAST TECHNIQUE: Multidetector CT imaging of the chest, abdomen and pelvis was performed following the standard protocol during bolus administration of intravenous contrast. CONTRAST:  53mL ISOVUE-300 IOPAMIDOL (ISOVUE-300) INJECTION 61% COMPARISON:  None. FINDINGS: CT CHEST FINDINGS Cardiovascular: Normal heart size. There is a low attenuation 1.0 cm filling defect in the left atrial appendage (series 2/ image 27). There is lipomatous hypertrophy of the interatrial septum. No significant pericardial fluid/thickening. Left main, left anterior descending, left circumflex and right coronary atherosclerosis status post CABG. Atherosclerotic nonaneurysmal thoracic aorta. Normal caliber pulmonary arteries. No central pulmonary emboli. Mediastinum/Nodes: Subcentimeter hypodense bilateral thyroid lobe nodules. Unremarkable esophagus. No pathologically enlarged axillary, mediastinal or hilar lymph nodes. Lungs/Pleura: No pneumothorax. No pleural effusion. Status post left upper lobectomy. Moderate centrilobular emphysema with mild diffuse bronchial wall thickening . No acute consolidative airspace disease, lung masses or significant pulmonary nodules. Musculoskeletal: No aggressive appearing focal osseous lesions. Mild thoracic spondylosis. Intact sternotomy wires. CT ABDOMEN PELVIS FINDINGS Hepatobiliary: Liver surface is diffusely finely irregular, suggesting cirrhosis. Diffuse hepatic steatosis. No liver mass. Cholecystectomy. Bile ducts are within normal post cholecystectomy limits with common bile duct diameter 8 mm. Pancreas: Normal, with no mass or duct dilation. Spleen: Normal size. No mass. Adrenals/Urinary  Tract: No discrete adrenal nodules. No hydronephrosis. Exophytic 1.3 cm simple renal cyst in the medial interpolar right kidney. Additional subcentimeter hypodense renal cortical lesions scattered in both kidneys are too small to characterize and require no further follow-up. Normal bladder. Stomach/Bowel: Grossly normal stomach. Status post diverting loop ileostomy in the ventral right lower abdominal wall. Moderate parastomal hernia contains multiple right pelvic small bowel loops. No small bowel dilatation, wall thickening, pneumatosis or focal caliber transition. Oral contrast progresses to the rectum. Normal appendix. Status post subtotal sigmoid colectomy with intact appearing colorectal anastomosis. Minimal residual diverticulosis in the distal colon. Relatively collapsed large bowel with no large bowel wall thickening or pericolonic fat stranding. Vascular/Lymphatic: Atherosclerotic nonaneurysmal abdominal aorta. Patent portal, splenic, hepatic and renal veins. No pathologically enlarged lymph nodes in the abdomen or pelvis. Reproductive: Status post hysterectomy, with no abnormal findings at the vaginal cuff. No adnexal mass. Other: No pneumoperitoneum, ascites or focal fluid collection. Diastasis in the supraumbilical midline ventral abdominal wall. Musculoskeletal: No aggressive appearing focal osseous lesions. Marked lumbar spondylosis. IMPRESSION: 1. Low-attenuation 1.0 cm filling defect in the left atrial appendage, suggestive of thrombus. Echocardiographic correlation advised. 2. No evidence of local tumor recurrence in the left lung status post left upper lobectomy. 3. No findings of metastatic disease in the chest, abdomen or pelvis. 4. 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 No evidence of bowel obstruction or ischemia. 5. Intact appearing colorectal anastomosis status post subtotal distal  colectomy. Minimal residual uncomplicated diverticulosis in the distal colon. 6. Suspected cirrhosis. Consider hepatic elastography for further liver fibrosis risk stratification, as clinically warranted. Aortic Atherosclerosis (ICD10-I70.0) and Emphysema (ICD10-J43.9). Electronically Signed: By: Ilona Sorrel M.D. On: 04/28/2017 11:40    ASSESSMENT / PLAN: 66 year old white female with a history of lung cancer and history of diverticulitis with colostomy seen today for acute renal failure with hyperkalemia  Patient will need further assessment and evaluation in the stepdown unit along with acute hemodialysis as per nephrology  Vas-Cath will be placed via  ultrasound    Patient/Family are satisfied with Plan of action and management. All questions answered  Corrin Parker, M.D.  [facility]  Medical Director [place] Director [facility] Cardio-Pulmonary Department

## 2017-04-29 NOTE — Progress Notes (Signed)
Called in for emergent HD for pt. Pt still in ED. Estimated time of arrival to ICU and room cannot be given. Lateef MD notified.

## 2017-04-29 NOTE — Progress Notes (Signed)
Pre hd assessment  

## 2017-04-29 NOTE — ED Provider Notes (Addendum)
[facility] Emergency Department Provider Note  ____________________________________________   First MD Initiated Contact with Patient 04/29/17 1557     (approximate)  I have reviewed the triage vital signs and the nursing notes.   HISTORY  Chief Complaint Abnormal Lab    HPI Carly Khan is a 66 y.o. female with medical history as listed below presents for evaluation of abnormal labs as an outpatient earlier today.  Her history is complicated, but in short, she has a colostomy after an episode of diverticulitis back when she lived in [place].  She has subsequently moved to [place] to be with her family, and would like to have a reversal of the colostomy.  She established care with Dr. Adonis Huguenin, and he asked her to obtain outpatient clearance from a number of specialists including cardiology before he would be able to take her to surgery.  She had an appointment yesterday with Dr. and the cardiologist who started her on Eliquis and ordered labs that were performed today.  She was contacted today by the lab and told to come to the emergency department for elevated potassium greater than 7.  She reports that she has been urinating but she thinks less then usual.  She has been in her usual state of health (which she laughingly states is not very good) until last night and she was up during the night with several episodes of vomiting.  She denies fever/chills, chest pain, shortness of breath, abdominal pain.  She states that her colostomy has been functioning normally.  The severity of her hyperkalemia was severe and is likely gradual in onset but they are not certain about the duration  Past Medical History:  Diagnosis Date  . CAD in native artery 02/28/2017  . Cancer of left lung ([place]) 2016   LUL lung resection  . CKD (chronic kidney disease) stage 3, GFR 30-59 ml/min 04/09/2017  . 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 Essential hypertension 04/09/2017  . History of  sebaceous cyst 02/28/2017  . Intermittent vertigo 04/09/2017  . Itching 02/28/2017  . 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 CKD (chronic kidney disease) stage 3, GFR 30-59 ml/min 04/09/2017  . Essential hypertension 04/09/2017  . Intermittent vertigo 04/09/2017  . CAD in native artery 02/28/2017  . 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 History of sebaceous cyst 02/28/2017  . Itching 02/28/2017  . 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 Lung nodule 06/25/2016  . Adenocarcinoma of left lung ([place]) 06/19/2016  . Primary cancer of left upper lobe of lung ([place]) 06/19/2016  . AKI (acute kidney injury) ([place]) 12/29/2015  . 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 cardiac bypass  1990  . carpel tunnel    . COLON SURGERY    . COLONOSCOPY    . CT guided needle placement    . EXPLORATORY LAPAROTOMY    . GALLBLADDER SURGERY    . loop ileostomy    . SIGMOID RESECTION / RECTOPEXY    . THORACOSCOPY    . vaginectomy      Prior to Admission medications   Medication Sig Start Date End Date Taking? Authorizing Provider  apixaban (ELIQUIS) 5 MG TABS tablet Take 1 tablet (5 mg total) by mouth 2 (two) times daily. 04/28/17  Yes End, Harrell Gave, MD  aspirin  EC 81 MG tablet Take 1 tablet by mouth daily. 04/09/17 04/09/18 Yes [provider]  metoprolol succinate (TOPROL-XL) 25 MG 24 hr tablet Take 1 tablet by mouth daily. 04/09/17 04/09/18 Yes [provider]  pravastatin (PRAVACHOL) 10 MG tablet Take 10 mg by mouth at bedtime.   Yes [provider]  sulfamethoxazole-trimethoprim (BACTRIM DS,SEPTRA DS) 800-160 MG tablet Take 1 tablet by mouth 2 (two) times daily. 04/24/17  Yes Clayburn Pert, MD  albuterol (PROVENTIL HFA;VENTOLIN HFA) 108 (90 Base) MCG/ACT inhaler Inhale 2 puffs into the  lungs every 6 (six) hours as needed. 04/13/17 04/13/18  [provider]  meclizine (ANTIVERT) 25 MG tablet Take 25 mg by mouth 3 (three) times daily as needed for dizziness.    [provider]    Allergies Penicillin g  Family History  Problem Relation Age of Onset  . Cancer Mother   . Heart disease Father   . Hypertension Father     Social History Social History  Substance Use Topics  . Smoking status: Current Every Day Smoker    Packs/day: 0.50    Years: 40.00  . Smokeless tobacco: Never Used  . Alcohol use No    Review of Systems Constitutional: No fever/chills Eyes: No visual changes. ENT: No sore throat. Cardiovascular: Denies chest pain. Respiratory: Denies shortness of breath. Gastrointestinal: No abdominal pain.  Several episodes of vomiting last night.  Normal functioning bowel movements into colostomy Genitourinary: Negative for dysuria.  Decreased urinary output recently. Musculoskeletal: Negative for neck pain.  Negative for back pain. Integumentary: Negative for rash. Neurological: Negative for headaches, focal weakness or numbness.   ____________________________________________   PHYSICAL EXAM:  VITAL SIGNS: ED Triage Vitals  Enc Vitals Group     BP 04/29/17 1511 (!) 101/56     Pulse Rate 04/29/17 1511 69     Resp 04/29/17 1511 18     Temp 04/29/17 1511 98.3 F (36.8 C)     Temp Source 04/29/17 1511 Oral     SpO2 04/29/17 1511 97 %     Weight 04/29/17 1511 66.2 kg (146 lb)     Height 04/29/17 1511 1.626 m (5\' 4" )     Head Circumference --      Peak Flow --      Pain Score 04/29/17 1510 0     Pain Loc --      Pain Edu? --      Excl. in [place]? --     Constitutional: Alert and oriented. Well appearing and in no acute Distress.  Does appear somewhat chronically ill but is in no distress. Eyes: Conjunctivae are normal.  Head: Atraumatic. Nose: No congestion/rhinnorhea. Mouth/Throat: Chronically very poor dentition. Mucous  membranes are tacky. Neck: No stridor.  No meningeal signs.   Cardiovascular: Normal rate, regular rhythm. Good peripheral circulation. Grossly normal heart sounds. Respiratory: Normal respiratory effort.  No retractions. Lungs CTAB. Gastrointestinal: Soft and nontender. No distention.  Colostomy is in place and appears to be functioning normally Musculoskeletal: No lower extremity tenderness nor edema. No gross deformities of extremities. Neurologic:  Normal speech and language. No gross focal neurologic deficits are appreciated.  Skin:  Skin is warm, dry and intact. No rash noted. Psychiatric: Mood and affect are normal. Speech and behavior are normal.  ____________________________________________   LABS (all labs ordered are listed, but only abnormal results are displayed)  Labs Reviewed  BASIC METABOLIC PANEL - Abnormal; Notable for the following:       Result Value  Sodium 131 (*)    Potassium 7.4 (*)    CO2 11 (*)    Glucose, Bld 106 (*)    BUN 91 (*)    Creatinine, Ser 5.37 (*)    GFR calc non Af Amer 7 (*)    GFR calc Af Amer 9 (*)    All other components within normal limits  MAGNESIUM   ____________________________________________  EKG  ED ECG REPORT #1 I, Genisis Sonnier, the attending physician, personally viewed and interpreted this ECG.  Date: 04/29/2017 EKG Time: 15:17 Rate: 78 Rhythm: normal sinus rhythm QRS Axis: RAD Intervals: normal ST/T Wave abnormalities: Non-specific ST segment / T-wave changes, but no evidence of acute ischemia. Narrative Interpretation: no evidence of acute ischemia and no evidence of peaked T waves   ED ECG REPORT #2 I, Dekota Shenk, the attending physician, personally viewed and interpreted this ECG.  Date: 04/29/2017 EKG Time: 16:44 Rate: 68 Rhythm: normal sinus rhythm QRS Axis: normal Intervals: normal ST/T Wave abnormalities: Non-specific ST segment / T-wave changes, but no evidence of acute ischemia. Narrative  Interpretation: No evidence of acute ischemia and no peaked T waves  ____________________________________________  RADIOLOGY   No results found.  ____________________________________________   PROCEDURES  Critical Care performed: Yes, see critical care procedure note(s)   Procedure(s) performed:   .Critical Care Performed by: Hinda Kehr Authorized by: Hinda Kehr   Critical care provider statement:    Critical care time (minutes):  40   Critical care time was exclusive of:  Separately billable procedures and treating other patients   Critical care was necessary to treat or prevent imminent or life-threatening deterioration of the following conditions:  Metabolic crisis and renal failure   Critical care was time spent personally by me on the following activities:  Development of treatment plan with patient or surrogate, discussions with consultants, evaluation of patient's response to treatment, examination of patient, obtaining history from patient or surrogate, ordering and performing treatments and interventions, ordering and review of laboratory studies, ordering and review of radiographic studies, pulse oximetry, re-evaluation of patient's condition and review of old charts      ____________________________________________   INITIAL IMPRESSION / [place] / ED COURSE  Pertinent labs & imaging results that were available during my care of the patient were reviewed by me and considered in my medical decision making (see chart for details).  The patient is in no acute distress.  Her labs as an outpatient were very concerning not only for her hyperkalemia but also for acute renal failure.  Reviewing her medical records demonstrates stage III chronic kidney disease but her last recorded creatinine measurements that I found in care everywhere are in the 1.3 range and today was greater than 5.  Her chronic GFR reportedly is in the 30s to 40s but today is in the  single digits if the labs are correct.  We are awaiting a repeat BMP in order to determine the next steps.   Clinical Course as of Apr 29 1708  Wed Apr 29, 2017  1641 The patient's labs came back consistent with the labs drawn earlier today.  She is in acute on chronic renal failure with a GFR of 7 and a potassium of 7.4.  I called immediately and spoke with Dr. Holley Raring who is on-call for nephrology.  He asked me to administer a dose of calcium gluconate 1 g IV and to call vascular surgery for dialysis catheter placement and Dr. Holley Raring will plan on dialyzing the  patient emergently.  I have placed a page to Dr. Delana Meyer to discuss.  I specifically asked Dr. Holley Raring if he wants any additional medications for hyperkalemia to be administered and he said no because they will emergently dialyze the patient. He did agree to a fluid bolus which I have ordered.  [CF]  6767 I spoke by phone with Dr. Delana Meyer who let me know that he would be willing to help, but in general the ICU doctors are the ones that put in the dialysis catheters in emergency situations.  I then called and spoke with him at Dr. Mortimer Fries who said he would be willing and able to come to the emergency department put in the appropriate catheter.  I have updated the patient and stiffening her nurse who is currently establishing peripheral access so that we can give the calcium gluconate and IV fluids as planned.  I also just spoke with Dr. Margaretmary Eddy the hospitalist who will admit.  [CF]  2094 Of note, the repeat EKG did not demonstrate any worsening intervals or concerning changes due to metabolic crisis  [CF]    Clinical Course User Index [CF] Hinda Kehr, MD    ____________________________________________  FINAL CLINICAL IMPRESSION(S) / ED DIAGNOSES  Final diagnoses:  Hyperkalemia  Acute renal failure, unspecified acute renal failure type ([place])     MEDICATIONS GIVEN DURING THIS VISIT:  Medications  calcium gluconate 1 g in sodium chloride  0.9 % 100 mL IVPB   sodium chloride 0.9 % bolus 1,000 mL      NEW OUTPATIENT MEDICATIONS STARTED DURING THIS VISIT:  New Prescriptions   No medications on file    Modified Medications   No medications on file    Discontinued Medications   No medications on file     Note:  This document was prepared using Dragon voice recognition software and may include unintentional dictation errors.    Hinda Kehr, MD 04/29/17 1705    Hinda Kehr, MD 04/29/17 [phone]

## 2017-04-29 NOTE — Telephone Encounter (Signed)
Received incoming call from [place] at [facility]. Critical result of potassium 7.3. Will address with Dr End.

## 2017-04-29 NOTE — Progress Notes (Signed)
[place] for Heparin Indication: atrial fibrillation/mural thrombus  Allergies  Allergen Reactions  . Penicillin G Hives and Itching    Patient Measurements: Height: 5\' 4"  (162.6 cm) Weight: 146 lb (66.2 kg) IBW/kg (Calculated) : 54.7 Heparin Dosing Weight: 66 kg  Vital Signs: Temp: 98.3 F (36.8 C) (06/27 1511) Temp Source: Oral (06/27 1511) BP: 104/46 (06/27 1900) Pulse Rate: 70 (06/27 1900)  Labs:  Recent Labs  04/29/17 1228 04/29/17 1514  HGB 14.2  --   HCT 42.1  --   PLT 237  --   LABPROT 16.3*  --   INR 1.30  --   CREATININE 5.25* 5.37*    Estimated Creatinine Clearance: 9.5 mL/min (A) (by C-G formula based on SCr of 5.37 mg/dL (H)).   Medical History: Past Medical History:  Diagnosis Date  . CAD in native artery 02/28/2017  . Cancer of left lung ([place]) 2016   LUL lung resection  . CKD (chronic kidney disease) stage 3, GFR 30-59 ml/min 04/09/2017  . 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 Essential hypertension 04/09/2017  . History of sebaceous cyst 02/28/2017  . Intermittent vertigo 04/09/2017  . Itching 02/28/2017  . 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   Goal of Therapy:  Heparin level 0.3-0.7 units/ml  APTT 68-109 Monitor platelets by anticoagulation protocol: Yes   Plan:  Start heparin infusion at 950 units/hr Check aPTT level in 6 hours and HL/aPTT daily while on heparin Continue to monitor H&H and platelets  Ulice Dash D 04/29/2017,8:10 PM

## 2017-04-29 NOTE — Progress Notes (Signed)
  End of hd 

## 2017-04-29 NOTE — Telephone Encounter (Signed)
Received incoming call from patient's daughter, Caryl Pina, ok per DPR. Explained to her results of lab work and recommendations regarding plan of care to go to ER for evaluation and treatment. She verbalized understanding and will take her mother there now.

## 2017-04-29 NOTE — ED Notes (Signed)
Notified CCU RN Tess that pharmacy called and stated pt's heparin drip will begin at 10pm as well as lab calling stating they will need a new heparin level specimen. RN verbalized understanding of this.

## 2017-04-29 NOTE — Progress Notes (Signed)
Pre hd info 

## 2017-04-30 ENCOUNTER — Ambulatory Visit: Admit: 2017-04-30 | Payer: Medicare Other | Admitting: Cardiovascular Disease

## 2017-04-30 ENCOUNTER — Inpatient Hospital Stay: Payer: Medicare Other

## 2017-04-30 LAB — BASIC METABOLIC PANEL
Anion gap: 10 (ref 5–15)
Anion gap: 9 (ref 5–15)
BUN: 46 mg/dL — ABNORMAL HIGH (ref 6–20)
BUN: 50 mg/dL — AB (ref 6–20)
CHLORIDE: 105 mmol/L (ref 101–111)
CHLORIDE: 106 mmol/L (ref 101–111)
CO2: 22 mmol/L (ref 22–32)
CO2: 23 mmol/L (ref 22–32)
CREATININE: 2.78 mg/dL — AB (ref 0.44–1.00)
CREATININE: 3.19 mg/dL — AB (ref 0.44–1.00)
Calcium: 8.6 mg/dL — ABNORMAL LOW (ref 8.9–10.3)
Calcium: 8.8 mg/dL — ABNORMAL LOW (ref 8.9–10.3)
GFR calc Af Amer: 16 mL/min — ABNORMAL LOW (ref >60)
GFR calc Af Amer: 19 mL/min — ABNORMAL LOW (ref >60)
GFR calc non Af Amer: 14 mL/min — ABNORMAL LOW (ref >60)
GFR calc non Af Amer: 17 mL/min — ABNORMAL LOW (ref >60)
Glucose, Bld: 80 mg/dL (ref 65–99)
Glucose, Bld: 83 mg/dL (ref 65–99)
POTASSIUM: 3.3 mmol/L — AB (ref 3.5–5.1)
POTASSIUM: 3.9 mmol/L (ref 3.5–5.1)
Sodium: 137 mmol/L (ref 135–145)
Sodium: 138 mmol/L (ref 135–145)

## 2017-04-30 LAB — CBC
HEMATOCRIT: 34.3 % — AB (ref 35.0–47.0)
HEMOGLOBIN: 12.4 g/dL (ref 12.0–16.0)
MCH: 34.6 pg — AB (ref 26.0–34.0)
MCHC: 36.2 g/dL — ABNORMAL HIGH (ref 32.0–36.0)
MCV: 95.4 fL (ref 80.0–100.0)
PLATELETS: 178 10*3/uL (ref 150–440)
RBC: 3.59 MIL/uL — AB (ref 3.80–5.20)
RDW: 12.2 % (ref 11.5–14.5)
WBC: 7.7 10*3/uL (ref 3.6–11.0)

## 2017-04-30 LAB — MRSA PCR SCREENING: MRSA BY PCR: NEGATIVE

## 2017-04-30 LAB — APTT: aPTT: 78 seconds — ABNORMAL HIGH (ref 24–36)

## 2017-04-30 SURGERY — ECHOCARDIOGRAM, TRANSESOPHAGEAL
Anesthesia: Moderate Sedation | Laterality: Left

## 2017-04-30 MED ORDER — SODIUM CHLORIDE 0.9 % IV SOLN
INTRAVENOUS | Status: DC
  Administered 2017-04-30 – 2017-05-02 (×5): via INTRAVENOUS

## 2017-04-30 MED ORDER — BENZONATATE 100 MG PO CAPS
100.0000 mg | ORAL_CAPSULE | Freq: Three times a day (TID) | ORAL | Status: DC
  Administered 2017-04-30 – 2017-05-04 (×12): 100 mg via ORAL
  Filled 2017-04-30 (×12): qty 1

## 2017-04-30 MED ORDER — MENTHOL 3 MG MT LOZG
1.0000 | LOZENGE | OROMUCOSAL | Status: DC | PRN
Start: 2017-04-30 — End: 2017-05-04
  Administered 2017-05-01 – 2017-05-02 (×6): 3 mg via ORAL
  Filled 2017-04-30: qty 9

## 2017-04-30 NOTE — Progress Notes (Signed)
[place] for Heparin Indication: atrial fibrillation/mural thrombus  Allergies  Allergen Reactions  . Penicillin G Hives and Itching    Patient Measurements: Height: 5\' 4"  (162.6 cm) Weight: 147 lb 11.3 oz (67 kg) IBW/kg (Calculated) : 54.7 Heparin Dosing Weight: 66 kg  Vital Signs: Temp: 98.2 F (36.8 C) (06/28 0000) Temp Source: Oral (06/28 0000) BP: 80/54 (06/28 0500) Pulse Rate: 83 (06/28 0500)  Labs:  Recent Labs  04/29/17 1228 04/29/17 1514 04/29/17 2138 04/29/17 2341 04/30/17 0356  HGB 14.2  --   --   --  12.4  HCT 42.1  --   --   --  34.3*  PLT 237  --   --   --  178  APTT  --   --  33  --  78*  LABPROT 16.3*  --   --   --   --   INR 1.30  --   --   --   --   HEPARINUNFRC  --   --  1.58*  --   --   CREATININE 5.25* 5.37*  --  2.78* 3.19*    Estimated Creatinine Clearance: 16.1 mL/min (A) (by C-G formula based on SCr of 3.19 mg/dL (H)).   Medical History: Past Medical History:  Diagnosis Date  . CAD in native artery 02/28/2017  . Cancer of left lung ([place]) 2016   LUL lung resection  . CKD (chronic kidney disease) stage 3, GFR 30-59 ml/min 04/09/2017  . 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 Essential hypertension 04/09/2017  . History of sebaceous cyst 02/28/2017  . Intermittent vertigo 04/09/2017  . Itching 02/28/2017  . 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   Goal of Therapy:  Heparin level 0.3-0.7 units/ml  APTT 68-109 Monitor platelets by anticoagulation protocol: Yes   Plan:  Start heparin infusion at 950 units/hr Check aPTT level in 6 hours and HL/aPTT daily while on heparin Continue to monitor H&H and platelets  6/28 AM aPTT 78. Continue current regimen. Recheck aPTT, CBC, and heparin level with tomorrow AM labs.  Lamel Mccarley S 04/30/2017,5:26 AM

## 2017-04-30 NOTE — Progress Notes (Signed)
Notified Dr. Leslye Peer of decreased BP and bleeding at temporary dialysis site. Will continue to monitor. MD to place orders for cough meds as this seems to be aggrivating the bleeding.

## 2017-04-30 NOTE — Progress Notes (Signed)
Rapid response called due to increase bleeding at cath site.  Patient continued to have bleeding from vas-cath site to rt femoralNo signs/symptoms of resp distress, BP continues to run low.  Pressure dressing was reinforced, and heparin was held , call  made to physician in regards to heparin therapy .  Dr. Felipe Drone a verbal order to hold the Heparin, check  BP every hour x2 and monitor site for bleeding, reassess at midnight.

## 2017-04-30 NOTE — Progress Notes (Signed)
[place] at [place] NAME: Carly Khan    MR#:  [id number]  DATE OF BIRTH:  [birth year]  SUBJECTIVE:  CHIEF COMPLAINT:   Chief Complaint  Patient presents with  . Abnormal Lab  feels weak REVIEW OF SYSTEMS:  Review of Systems  Constitutional: Positive for malaise/fatigue. Negative for chills, fever and weight loss.  HENT: Negative for nosebleeds and sore throat.   Eyes: Negative for blurred vision.  Respiratory: Negative for cough, shortness of breath and wheezing.   Cardiovascular: Negative for chest pain, orthopnea, leg swelling and PND.  Gastrointestinal: Negative for abdominal pain, constipation, diarrhea, heartburn, nausea and vomiting.  Genitourinary: Negative for dysuria and urgency.  Musculoskeletal: Negative for back pain.  Skin: Negative for rash.  Neurological: Positive for weakness. Negative for dizziness, speech change, focal weakness and headaches.  Endo/Heme/Allergies: Does not bruise/bleed easily.  Psychiatric/Behavioral: Negative for depression.    DRUG ALLERGIES:   Allergies  Allergen Reactions  . Penicillin G Hives and Itching   VITALS:  Blood pressure 100/86, pulse 84, temperature 99.6 F (37.6 C), temperature source Oral, resp. rate 18, height 5\' 4"  (1.626 m), weight 67 kg (147 lb 11.3 oz), SpO2 98 %. PHYSICAL EXAMINATION:  Physical Exam  Constitutional: She is oriented to person, place, and time and well-developed, well-nourished, and in no distress.  HENT:  Head: Normocephalic and atraumatic.  Eyes: Conjunctivae and EOM are normal. Pupils are equal, round, and reactive to light.  Neck: Normal range of motion. Neck supple. No tracheal deviation present. No thyromegaly present.  Cardiovascular: Normal rate, regular rhythm and normal heart sounds.   Pulmonary/Chest: Effort normal and breath sounds normal. No respiratory distress. She has no wheezes. She exhibits no tenderness.  Abdominal: Soft. Bowel  sounds are normal. She exhibits no distension. There is no tenderness.  Musculoskeletal: Normal range of motion.  Neurological: She is alert and oriented to person, place, and time. No cranial nerve deficit.  Skin: Skin is warm and dry. No rash noted.  Psychiatric: Mood and affect normal.   LABORATORY PANEL:  Female CBC  Recent Labs Lab 04/30/17 0356  WBC 7.7  HGB 12.4  HCT 34.3*  PLT 178   ------------------------------------------------------------------------------------------------------------------ Chemistries   Recent Labs Lab 04/29/17 1514  04/30/17 0356  NA 131*  < > 137  K 7.4*  < > 3.9  CL 110  < > 106  CO2 11*  < > 22  GLUCOSE 106*  < > 83  BUN 91*  < > 50*  CREATININE 5.37*  < > 3.19*  CALCIUM 8.9  < > 8.8*  MG 2.7*  --   --   < > = values in this interval not displayed. RADIOLOGY:  US Renal  Result Date: 04/30/2017 CLINICAL DATA:  Acute renal insufficiency EXAM: RENAL / URINARY TRACT ULTRASOUND COMPLETE COMPARISON:  CT abdomen and pelvis of 04/28/2017 FINDINGS: Right Kidney: Length: 10.1 cm. No hydronephrosis is seen. The echogenicity of the parenchyma of the kidney is unremarkable. Left Kidney: Length: 9.2 cm.  No hydronephrosis is seen. Bladder: The urinary bladder is unremarkable and moderately distended. Bilateral ureteral jets are present IMPRESSION: 1. No hydronephrosis. 2. Renal parenchymal echogenicity is within normal limits. 3. Bilateral ureteral jets are present. Electronically Signed   By: Ivar Drape M.D.   On: 04/30/2017 11:04   ASSESSMENT AND PLAN:  Carly Khan  is a 66 y.o. female with a known history of Coronary artery disease disease status post quadruple CABG,  chronic kidney disease stage III, history of lung cancer status post pneumonectomy on the left, chronic diverticulitis status post colostomy was recently moved from [place] to [place] and was seen by Dr. Adonis Huguenin for her colostomy reversal. She was referred to cardiology Dr. Saunders Revel for  cardiac clearance. Initial lab work was abnormal and patient was sent to the ED, repeat labs in the emergency department has revealed potassium is 7.4 and sodium 131 creatinine 5.4, no EKG changes were noticed.  #Acute on CKD 3 with hyperkalemia secondary to ATN could be from Bactrim use - s/p calcium gluconate, No EKG changes. Now resolved s/p HD - s/p urgent hemodialysis after placing a temporary dialysis catheter last evening, nephro following Avoid nephrotoxins Monitor intake and output Renal ultrasound WNL  #Coronary artery disease status post CABG Patient denies any chest pain  Continue home medications aspirin, beta blocker  #cardiac thrombus on imaging Continue Eliquis, pharmacy to dose  #Hyponatremia Patient is getting hemodialysis  #Tobacco abuse disorder counseled patient to quit smoking - nicotine patch     All the records are reviewed and case discussed with Care Management/Social Worker. Management plans discussed with the patient, nursing and they are in agreement.  CODE STATUS: Full Code  TOTAL TIME TAKING CARE OF THIS PATIENT: 35 minutes.   More than 50% of the time was spent in counseling/coordination of care: YES  POSSIBLE D/C IN 2-3 DAYS, DEPENDING ON CLINICAL CONDITION.   Max Sane M.D on 04/30/2017 at 1:07 PM  Between 7am to 6pm - Pager - [phone]  After 6pm go to [email or web address] - Proofreader  [facility]  Office  [phone]  CC: Primary care physician; Glendon Axe, MD  Note: This dictation was prepared with Dragon dictation along with smaller phrase technology. Any transcriptional errors that result from this process are unintentional.

## 2017-04-30 NOTE — Progress Notes (Signed)
Patient alert and oriented no complaints of pain. Heparin at 9.5 ml/hr. Dialysis catheter intact. Colostomy intact. Called daughter to inform her of patients move.

## 2017-04-30 NOTE — Progress Notes (Signed)
Per Dr. Manuella Ghazi okay to place order for bed rest as she has a temporary dialysis catheter in the right groin

## 2017-04-30 NOTE — Progress Notes (Signed)
[facility]  ROUNDING NOTE   Subjective:  Patient did receive dialysis treatment yesterday. Potassium down to 3.9 this a.m. BUN down to 50 with a creatinine of 3.19. Unclear as to whether urine output is accurate. Patient also appears to be a bit hypotensive.   Objective:  Vital signs in last 24 hours:  Temp:  [97.7 F (36.5 C)-99.2 F (37.3 C)] 99.2 F (37.3 C) (06/28 0500) Pulse Rate:  [65-118] 83 (06/28 0710) Resp:  [16-25] 16 (06/28 0710) BP: (73-114)/(43-72) 79/51 (06/28 0710) SpO2:  [96 %-100 %] 98 % (06/28 0710) Weight:  [66.2 kg (146 lb)-67 kg (147 lb 11.3 oz)] 67 kg (147 lb 11.3 oz) (06/27 2110)  Weight change:  Filed Weights   04/29/17 1511 04/29/17 2110  Weight: 66.2 kg (146 lb) 67 kg (147 lb 11.3 oz)    Intake/Output: I/O last 3 completed shifts: In: -  Out: -303 [Urine:200]   Intake/Output this shift:  No intake/output data recorded.  Physical Exam: General: No acute distress  Head: Normocephalic, atraumatic. Moist oral mucosal membranes, poor dentition  Eyes: Anicteric  Neck: Supple, trachea midline  Lungs:  Clear to auscultation, normal effort  Heart: S1S2 no rubs  Abdomen:  Soft, nontender, bowel sounds present, colostomy in place  Extremities: Trace peripheral edema.  Neurologic: Awake, alert, following commands  Skin: No lesions  Access: Right temporary femoral dialysis catheter    Basic Metabolic Panel:  Recent Labs Lab 04/29/17 1228 04/29/17 1514 04/29/17 2138 04/29/17 2341 04/30/17 0356  NA 128* 131*  --  138 137  K 7.3* 7.4*  --  3.3* 3.9  CL 106 110  --  105 106  CO2 13* 11*  --  23 22  GLUCOSE 97 106*  --  80 83  BUN 89* 91*  --  46* 50*  CREATININE 5.25* 5.37*  --  2.78* 3.19*  CALCIUM 8.9 8.9  --  8.6* 8.8*  MG  --  2.7*  --   --   --   PHOS  --   --  9.2*  --   --     Liver Function Tests: No results for input(s): AST, ALT, ALKPHOS, BILITOT, PROT, ALBUMIN in the last 168 hours. No results for input(s):  LIPASE, AMYLASE in the last 168 hours. No results for input(s): AMMONIA in the last 168 hours.  CBC:  Recent Labs Lab 04/29/17 1228 04/30/17 0356  WBC 9.3 7.7  NEUTROABS 6.6*  --   HGB 14.2 12.4  HCT 42.1 34.3*  MCV 97.6 95.4  PLT 237 178    Cardiac Enzymes: No results for input(s): CKTOTAL, CKMB, CKMBINDEX, TROPONINI in the last 168 hours.  BNP: Invalid input(s): POCBNP  CBG:  Recent Labs Lab 04/29/17 2130  GLUCAP 69    Microbiology: Results for orders placed or performed during the hospital encounter of 04/29/17  MRSA PCR Screening     Status: None   Collection Time: 04/30/17 12:16 AM  Result Value Ref Range Status   MRSA by PCR NEGATIVE NEGATIVE Final    Comment:        The GeneXpert MRSA Assay (FDA approved for NASAL specimens only), is one component of a comprehensive MRSA colonization surveillance program. It is not intended to diagnose MRSA infection nor to guide or monitor treatment for MRSA infections.     Coagulation Studies:  Recent Labs  04/29/17 1228  LABPROT 16.3*  INR 1.30    Urinalysis: No results for input(s): COLORURINE, LABSPEC, [place], GLUCOSEU, HGBUR,  BILIRUBINUR, KETONESUR, PROTEINUR, UROBILINOGEN, NITRITE, LEUKOCYTESUR in the last 72 hours.  Invalid input(s): APPERANCEUR    Imaging: Ct Chest W Contrast  Addendum Date: 04/28/2017   ADDENDUM REPORT: 04/28/2017 11:46 ADDENDUM: These results will be called to the ordering clinician or representative by the Radiologist Assistant, and communication documented in the PACS or zVision Dashboard. Electronically Signed   By: Ilona Sorrel M.D.   On: 04/28/2017 11:46   Result Date: 04/28/2017 CLINICAL DATA:  History of left upper lobectomy in 2016 for lung cancer. 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 Patient is reportedly preoperative for reversal of ileostomy. Reported history of ventral hernia. Prior hysterectomy. EXAM: CT CHEST,  ABDOMEN, AND PELVIS WITH CONTRAST TECHNIQUE: Multidetector CT imaging of the chest, abdomen and pelvis was performed following the standard protocol during bolus administration of intravenous contrast. CONTRAST:  24mL ISOVUE-300 IOPAMIDOL (ISOVUE-300) INJECTION 61% COMPARISON:  None. FINDINGS: CT CHEST FINDINGS Cardiovascular: Normal heart size. There is a low attenuation 1.0 cm filling defect in the left atrial appendage (series 2/ image 27). There is lipomatous hypertrophy of the interatrial septum. No significant pericardial fluid/thickening. Left main, left anterior descending, left circumflex and right coronary atherosclerosis status post CABG. Atherosclerotic nonaneurysmal thoracic aorta. Normal caliber pulmonary arteries. No central pulmonary emboli. Mediastinum/Nodes: Subcentimeter hypodense bilateral thyroid lobe nodules. Unremarkable esophagus. No pathologically enlarged axillary, mediastinal or hilar lymph nodes. Lungs/Pleura: No pneumothorax. No pleural effusion. Status post left upper lobectomy. Moderate centrilobular emphysema with mild diffuse bronchial wall thickening . No acute consolidative airspace disease, lung masses or significant pulmonary nodules. Musculoskeletal: No aggressive appearing focal osseous lesions. Mild thoracic spondylosis. Intact sternotomy wires. CT ABDOMEN PELVIS FINDINGS Hepatobiliary: Liver surface is diffusely finely irregular, suggesting cirrhosis. Diffuse hepatic steatosis. No liver mass. Cholecystectomy. Bile ducts are within normal post cholecystectomy limits with common bile duct diameter 8 mm. Pancreas: Normal, with no mass or duct dilation. Spleen: Normal size. No mass. Adrenals/Urinary Tract: No discrete adrenal nodules. No hydronephrosis. Exophytic 1.3 cm simple renal cyst in the medial interpolar right kidney. Additional subcentimeter hypodense renal cortical lesions scattered in both kidneys are too small to characterize and require no further follow-up. Normal  bladder. Stomach/Bowel: Grossly normal stomach. Status post diverting loop ileostomy in the ventral right lower abdominal wall. Moderate parastomal hernia contains multiple right pelvic small bowel loops. No small bowel dilatation, wall thickening, pneumatosis or focal caliber transition. Oral contrast progresses to the rectum. Normal appendix. Status post subtotal sigmoid colectomy with intact appearing colorectal anastomosis. Minimal residual diverticulosis in the distal colon. Relatively collapsed large bowel with no large bowel wall thickening or pericolonic fat stranding. Vascular/Lymphatic: Atherosclerotic nonaneurysmal abdominal aorta. Patent portal, splenic, hepatic and renal veins. No pathologically enlarged lymph nodes in the abdomen or pelvis. Reproductive: Status post hysterectomy, with no abnormal findings at the vaginal cuff. No adnexal mass. Other: No pneumoperitoneum, ascites or focal fluid collection. Diastasis in the supraumbilical midline ventral abdominal wall. Musculoskeletal: No aggressive appearing focal osseous lesions. Marked lumbar spondylosis. IMPRESSION: 1. Low-attenuation 1.0 cm filling defect in the left atrial appendage, suggestive of thrombus. Echocardiographic correlation advised. 2. No evidence of local tumor recurrence in the left lung status post left upper lobectomy. 3. No findings of metastatic disease in the chest, abdomen or pelvis. 4. 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 No evidence of bowel obstruction or ischemia. 5. Intact appearing colorectal anastomosis status post subtotal distal colectomy.  Minimal residual uncomplicated diverticulosis in the distal colon. 6. Suspected cirrhosis. Consider hepatic elastography for further liver fibrosis risk stratification, as clinically warranted. Aortic Atherosclerosis (ICD10-I70.0) and Emphysema (ICD10-J43.9). Electronically  Signed: By: Ilona Sorrel M.D. On: 04/28/2017 11:40   Ct Abdomen Pelvis W Contrast  Addendum Date: 04/28/2017   ADDENDUM REPORT: 04/28/2017 11:46 ADDENDUM: These results will be called to the ordering clinician or representative by the Radiologist Assistant, and communication documented in the PACS or zVision Dashboard. Electronically Signed   By: Ilona Sorrel M.D.   On: 04/28/2017 11:46   Result Date: 04/28/2017 CLINICAL DATA:  History of left upper lobectomy in 2016 for lung cancer. 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 Patient is reportedly preoperative for reversal of ileostomy. Reported history of ventral hernia. Prior hysterectomy. EXAM: CT CHEST, ABDOMEN, AND PELVIS WITH CONTRAST TECHNIQUE: Multidetector CT imaging of the chest, abdomen and pelvis was performed following the standard protocol during bolus administration of intravenous contrast. CONTRAST:  32mL ISOVUE-300 IOPAMIDOL (ISOVUE-300) INJECTION 61% COMPARISON:  None. FINDINGS: CT CHEST FINDINGS Cardiovascular: Normal heart size. There is a low attenuation 1.0 cm filling defect in the left atrial appendage (series 2/ image 27). There is lipomatous hypertrophy of the interatrial septum. No significant pericardial fluid/thickening. Left main, left anterior descending, left circumflex and right coronary atherosclerosis status post CABG. Atherosclerotic nonaneurysmal thoracic aorta. Normal caliber pulmonary arteries. No central pulmonary emboli. Mediastinum/Nodes: Subcentimeter hypodense bilateral thyroid lobe nodules. Unremarkable esophagus. No pathologically enlarged axillary, mediastinal or hilar lymph nodes. Lungs/Pleura: No pneumothorax. No pleural effusion. Status post left upper lobectomy. Moderate centrilobular emphysema with mild diffuse bronchial wall thickening . No acute consolidative airspace disease, lung masses or significant pulmonary nodules. Musculoskeletal: No aggressive  appearing focal osseous lesions. Mild thoracic spondylosis. Intact sternotomy wires. CT ABDOMEN PELVIS FINDINGS Hepatobiliary: Liver surface is diffusely finely irregular, suggesting cirrhosis. Diffuse hepatic steatosis. No liver mass. Cholecystectomy. Bile ducts are within normal post cholecystectomy limits with common bile duct diameter 8 mm. Pancreas: Normal, with no mass or duct dilation. Spleen: Normal size. No mass. Adrenals/Urinary Tract: No discrete adrenal nodules. No hydronephrosis. Exophytic 1.3 cm simple renal cyst in the medial interpolar right kidney. Additional subcentimeter hypodense renal cortical lesions scattered in both kidneys are too small to characterize and require no further follow-up. Normal bladder. Stomach/Bowel: Grossly normal stomach. Status post diverting loop ileostomy in the ventral right lower abdominal wall. Moderate parastomal hernia contains multiple right pelvic small bowel loops. No small bowel dilatation, wall thickening, pneumatosis or focal caliber transition. Oral contrast progresses to the rectum. Normal appendix. Status post subtotal sigmoid colectomy with intact appearing colorectal anastomosis. Minimal residual diverticulosis in the distal colon. Relatively collapsed large bowel with no large bowel wall thickening or pericolonic fat stranding. Vascular/Lymphatic: Atherosclerotic nonaneurysmal abdominal aorta. Patent portal, splenic, hepatic and renal veins. No pathologically enlarged lymph nodes in the abdomen or pelvis. Reproductive: Status post hysterectomy, with no abnormal findings at the vaginal cuff. No adnexal mass. Other: No pneumoperitoneum, ascites or focal fluid collection. Diastasis in the supraumbilical midline ventral abdominal wall. Musculoskeletal: No aggressive appearing focal osseous lesions. Marked lumbar spondylosis. IMPRESSION: 1. Low-attenuation 1.0 cm filling defect in the left atrial appendage, suggestive of thrombus. Echocardiographic  correlation advised. 2. No evidence of local tumor recurrence in the left lung status post left upper lobectomy. 3. No findings of metastatic disease in the chest, abdomen or pelvis. 4. Moderate parastomal hernia containing multiple small bowel loops in the ventral right lower abdominal  wall at the diverting loop ileostomy site. No evidence of bowel obstruction or ischemia. 5. Intact appearing colorectal anastomosis status post subtotal distal colectomy. Minimal residual uncomplicated diverticulosis in the distal colon. 6. Suspected cirrhosis. Consider hepatic elastography for further liver fibrosis risk stratification, as clinically warranted. Aortic Atherosclerosis (ICD10-I70.0) and Emphysema (ICD10-J43.9). Electronically Signed: By: Ilona Sorrel M.D. On: 04/28/2017 11:40     Medications:   . sodium chloride    . sodium chloride    . heparin 950 Units/hr (04/30/17 0600)   . aspirin EC  81 mg Oral Daily  . metoprolol succinate  25 mg Oral Daily  . nicotine  14 mg Transdermal Daily   sodium chloride, sodium chloride, acetaminophen **OR** acetaminophen, albuterol, alteplase, heparin, lidocaine (PF), lidocaine-prilocaine, ondansetron **OR** ondansetron (ZOFRAN) IV, pentafluoroprop-tetrafluoroeth  Assessment/ Plan:  66 y.o. female with a PMHx of asthma, coronary artery disease s/p CABG, epistaxis, COPD, hyperlipidemia, hypertension, peripheral vascular disease status post aortobifemoral bypass, who was admitted to [facility] on 04/29/2017 for evaluation and management of acute renal failure and severe hyperkalemia.   1. Acute renal failure/chronic kidney disease stage III Baseline creatinine 1.7.  Cause of acute renal failure currently unclear. Patient was on Bactrim 1-2 doses. In addition she's had diminished by mouth intake per her report. - Patient underwent one hemodialysis treatment last night. She tolerated this well. Serum potassium is down. No urgent indication for dialysis at the moment.  Continue to monitor renal function daily.  2. Hyperkalemia. Potassium down to the normal level. Continue to monitor serum potassium. Hold off on additional dialysis for now.  3. Metabolic acidosis. Serum bicarbonate up to 22. Significantly improved with dialysis.  4. Hypotension.  Blood pressure still a bit low. Start the patient on 0.9 normal saline at 100 cc per hour.  Consider pressors if pressure remains low.    LOS: 1 Carly Khan 6/28/20188:23 AM

## 2017-04-30 NOTE — Progress Notes (Signed)
Made multiple attempts to get in touch with Dr. Manuella Ghazi and notify of decrease BP and bleeding a groin access for hemodialysis. Called to ED and was able to get in touch with Dr. Molli Hazard and notify of BP of 94/55. Pt is on maintenance fluids at 100. Also notified MD of bleeding at hemodialysis access. No new orders received and will continue to monitor.

## 2017-04-30 NOTE — Progress Notes (Signed)
Rapid Response Event Note  Overview: RR called to pt room pt has increase bleeding from vas-cath site to rt femoral       Initial Focused Assessment:pt AAOx4 pt has no resp distress BP wnl at present.   Interventions:pressure dressing was applied and heparin was held , call was being made to physician in regards to heparin therapy   Plan of Care (if not transferred):take BP every hour x2 and monitor site for bleeding   Event Summary:   at  2054     at          [facility]

## 2017-04-30 NOTE — Progress Notes (Signed)
Verified with Dr. Fletcher Anon that TEE was not needed and that old signed and held orders could be discontinued.

## 2017-05-01 DIAGNOSIS — I513 Intracardiac thrombosis, not elsewhere classified: Secondary | ICD-10-CM

## 2017-05-01 LAB — BASIC METABOLIC PANEL
Anion gap: 7 (ref 5–15)
BUN: 61 mg/dL — AB (ref 6–20)
CHLORIDE: 108 mmol/L (ref 101–111)
CO2: 17 mmol/L — AB (ref 22–32)
CREATININE: 2.66 mg/dL — AB (ref 0.44–1.00)
Calcium: 8.1 mg/dL — ABNORMAL LOW (ref 8.9–10.3)
GFR calc Af Amer: 20 mL/min — ABNORMAL LOW (ref >60)
GFR calc non Af Amer: 17 mL/min — ABNORMAL LOW (ref >60)
GLUCOSE: 85 mg/dL (ref 65–99)
POTASSIUM: 4.5 mmol/L (ref 3.5–5.1)
SODIUM: 132 mmol/L — AB (ref 135–145)

## 2017-05-01 LAB — CBC
HEMATOCRIT: 30.5 % — AB (ref 35.0–47.0)
HEMATOCRIT: 30.9 % — AB (ref 35.0–47.0)
HEMOGLOBIN: 11 g/dL — AB (ref 12.0–16.0)
Hemoglobin: 11 g/dL — ABNORMAL LOW (ref 12.0–16.0)
MCH: 34.9 pg — AB (ref 26.0–34.0)
MCH: 34.1 pg — AB (ref 26.0–34.0)
MCHC: 36 g/dL (ref 32.0–36.0)
MCHC: 35.6 g/dL (ref 32.0–36.0)
MCV: 96.9 fL (ref 80.0–100.0)
MCV: 95.9 fL (ref 80.0–100.0)
PLATELETS: 144 10*3/uL — AB (ref 150–440)
Platelets: 146 10*3/uL — ABNORMAL LOW (ref 150–440)
RBC: 3.15 MIL/uL — ABNORMAL LOW (ref 3.80–5.20)
RBC: 3.22 MIL/uL — ABNORMAL LOW (ref 3.80–5.20)
RDW: 12.1 % (ref 11.5–14.5)
RDW: 11.9 % (ref 11.5–14.5)
WBC: 5.6 10*3/uL (ref 3.6–11.0)
WBC: 4.7 10*3/uL (ref 3.6–11.0)

## 2017-05-01 LAB — URINALYSIS, ROUTINE W REFLEX MICROSCOPIC
Bilirubin Urine: NEGATIVE
GLUCOSE, UA: NEGATIVE mg/dL
KETONES UR: NEGATIVE mg/dL
Nitrite: NEGATIVE
PH: 5 (ref 5.0–8.0)
Protein, ur: NEGATIVE mg/dL
Specific Gravity, Urine: 1.012 (ref 1.005–1.030)

## 2017-05-01 LAB — APTT
APTT: 29 s (ref 24–36)
aPTT: 104 seconds — ABNORMAL HIGH (ref 24–36)

## 2017-05-01 LAB — HEPATITIS B SURFACE ANTIBODY,QUALITATIVE: HEP B S AB: NONREACTIVE

## 2017-05-01 LAB — PARATHYROID HORMONE, INTACT (NO CA): PTH: 64 pg/mL (ref 15–65)

## 2017-05-01 LAB — HEPARIN LEVEL (UNFRACTIONATED): Heparin Unfractionated: <0.1 IU/mL — ABNORMAL LOW (ref 0.30–0.70)

## 2017-05-01 LAB — HEPATITIS B SURFACE ANTIGEN: Hepatitis B Surface Ag: NEGATIVE

## 2017-05-01 MED ORDER — FLUTICASONE PROPIONATE 50 MCG/ACT NA SUSP
1.0000 | Freq: Two times a day (BID) | NASAL | Status: DC
  Administered 2017-05-01 – 2017-05-04 (×7): 1 via NASAL
  Filled 2017-05-01: qty 16

## 2017-05-01 NOTE — Progress Notes (Signed)
Per Cardiology PA restart heparin drip and if bleeding occurs call back.

## 2017-05-01 NOTE — Progress Notes (Signed)
[facility] for Heparin Indication: atrial fibrillation/mural thrombus  Allergies  Allergen Reactions  . Penicillin G Hives and Itching    Patient Measurements: Height: 5\' 4"  (162.6 cm) Weight: 147 lb 11.3 oz (67 kg) IBW/kg (Calculated) : 54.7 Heparin Dosing Weight: 66 kg  Vital Signs: Temp: 97.9 F (36.6 C) (06/29 0424) Temp Source: Oral (06/29 0424) BP: 95/50 (06/29 0424) Pulse Rate: 69 (06/29 0424)  Labs:  Recent Labs  04/29/17 1228  04/29/17 2138 04/29/17 2341 04/30/17 0356 05/01/17 0316  HGB 14.2  --   --   --  12.4 11.0*  HCT 42.1  --   --   --  34.3* 30.5*  PLT 237  --   --   --  178 146*  APTT  --   --  33  --  78* 29  LABPROT 16.3*  --   --   --   --   --   INR 1.30  --   --   --   --   --   HEPARINUNFRC  --   --  1.58*  --   --  <0.10*  CREATININE 5.25*  < >  --  2.78* 3.19* 2.66*  < > = values in this interval not displayed.  Estimated Creatinine Clearance: 19.3 mL/min (A) (by C-G formula based on SCr of 2.66 mg/dL (H)).   Medical History: Past Medical History:  Diagnosis Date  . CAD in native artery 02/28/2017  . Cancer of left lung ([place]) 2016   LUL lung resection  . CKD (chronic kidney disease) stage 3, GFR 30-59 ml/min 04/09/2017  . 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 Essential hypertension 04/09/2017  . History of sebaceous cyst 02/28/2017  . Intermittent vertigo 04/09/2017  . Itching 02/28/2017  . 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   Goal of Therapy:  Heparin level 0.3-0.7 units/ml  APTT 68-109 Monitor platelets by anticoagulation protocol: Yes   Plan:  Start heparin infusion at 950 units/hr Check aPTT level in 6 hours and HL/aPTT daily while on heparin Continue to monitor H&H and platelets  6/28 AM aPTT 78. Continue current regimen. Recheck aPTT, CBC, and  heparin level with tomorrow AM labs.  6/29 AM aPTT 29, heparin level <0.1. Heparin drip turned off last night due to bleed from catheter site. Hospitalist will reassess and determine if drip needs to be restarted.  Brylynn Hanssen S 05/01/2017,5:56 AM

## 2017-05-01 NOTE — Progress Notes (Addendum)
Per Dr. Manuella Ghazi okay to discontinue bed rest order as temporary dialysis catheter has been removed, will continue to monitor for bleeding.

## 2017-05-01 NOTE — Progress Notes (Signed)
Per Dr. Holley Raring place order for temporary dialysis catheter to be removed.

## 2017-05-01 NOTE — Progress Notes (Signed)
[place] [place] NAME: Carly Khan    MR#:  [id number]  DATE OF BIRTH:  1951/03/28  SUBJECTIVE:  CHIEF COMPLAINT:   Chief Complaint  Patient presents with  . Abnormal Lab  Had a rapid response for gross hematuria last evening, heparin was held and now it is resumed REVIEW OF SYSTEMS:  Review of Systems  Constitutional: Positive for malaise/fatigue. Negative for chills, fever and weight loss.  HENT: Negative for nosebleeds and sore throat.   Eyes: Negative for blurred vision.  Respiratory: Negative for cough, shortness of breath and wheezing.   Cardiovascular: Negative for chest pain, orthopnea, leg swelling and PND.  Gastrointestinal: Negative for abdominal pain, constipation, diarrhea, heartburn, nausea and vomiting.  Genitourinary: Positive for hematuria. Negative for dysuria and urgency.  Musculoskeletal: Negative for back pain.  Skin: Negative for rash.  Neurological: Positive for weakness. Negative for dizziness, speech change, focal weakness and headaches.  Endo/Heme/Allergies: Does not bruise/bleed easily.  Psychiatric/Behavioral: Negative for depression.   DRUG ALLERGIES:   Allergies  Allergen Reactions  . Penicillin G Hives and Itching   VITALS:  Blood pressure (!) 108/53, pulse 69, temperature 97.9 F (36.6 C), temperature source Oral, resp. rate 20, height 5\' 4"  (1.626 m), weight 67 kg (147 lb 11.3 oz), SpO2 98 %. PHYSICAL EXAMINATION:  Physical Exam  Constitutional: She is oriented to person, place, and time and well-developed, well-nourished, and in no distress.  HENT:  Head: Normocephalic and atraumatic.  Eyes: Conjunctivae and EOM are normal. Pupils are equal, round, and reactive to light.  Neck: Normal range of motion. Neck supple. No tracheal deviation present. No thyromegaly present.  Cardiovascular: Normal rate, regular rhythm and normal heart sounds.   Pulmonary/Chest: Effort normal and breath sounds  normal. No respiratory distress. She has no wheezes. She exhibits no tenderness.  Abdominal: Soft. Bowel sounds are normal. She exhibits no distension. There is no tenderness.  Musculoskeletal: Normal range of motion.  Neurological: She is alert and oriented to person, place, and time. No cranial nerve deficit.  Skin: Skin is warm and dry. No rash noted.  Psychiatric: Mood and affect normal.   LABORATORY PANEL:  Female CBC  Recent Labs Lab 05/01/17 0316  WBC 5.6  HGB 11.0*  HCT 30.5*  PLT 146*   ------------------------------------------------------------------------------------------------------------------ Chemistries   Recent Labs Lab 04/29/17 1514  05/01/17 0316  NA 131*  < > 132*  K 7.4*  < > 4.5  CL 110  < > 108  CO2 11*  < > 17*  GLUCOSE 106*  < > 85  BUN 91*  < > 61*  CREATININE 5.37*  < > 2.66*  CALCIUM 8.9  < > 8.1*  MG 2.7*  --   --   < > = values in this interval not displayed. RADIOLOGY:  US Renal  Result Date: 04/30/2017 CLINICAL DATA:  Acute renal insufficiency EXAM: RENAL / URINARY TRACT ULTRASOUND COMPLETE COMPARISON:  CT abdomen and pelvis of 04/28/2017 FINDINGS: Right Kidney: Length: 10.1 cm. No hydronephrosis is seen. The echogenicity of the parenchyma of the kidney is unremarkable. Left Kidney: Length: 9.2 cm.  No hydronephrosis is seen. Bladder: The urinary bladder is unremarkable and moderately distended. Bilateral ureteral jets are present IMPRESSION: 1. No hydronephrosis. 2. Renal parenchymal echogenicity is within normal limits. 3. Bilateral ureteral jets are present. Electronically Signed   By: Ivar Drape M.D.   On: 04/30/2017 11:04   ASSESSMENT AND PLAN:  Joellyn Haff  is a 66 y.o. female with a known history of Coronary artery disease disease status post quadruple CABG, chronic kidney disease stage III, history of lung cancer status post pneumonectomy on the left, chronic diverticulitis status post colostomy was recently moved from [place] to [place] and was seen by Dr. Adonis Huguenin for her colostomy reversal. She was referred to cardiology Dr. Saunders Revel for cardiac clearance. Initial lab work was abnormal and patient was sent to the ED, repeat labs in the emergency department has revealed potassium is 7.4 and sodium 131 creatinine 5.4, no EKG changes were noticed.  #Acute on CKD 3 with hyperkalemia secondary to ATN could be from Bactrim use - s/p calcium gluconate, No EKG changes. Now resolved s/p HD - s/p urgent hemodialysis after placing a temporary dialysis catheter, nephro following Avoid nephrotoxins Monitor intake and output Renal ultrasound WNL - Creatinine 2.66 this morning  #Coronary artery disease status post CABG Patient denies any chest pain  Continue aspirin, beta blocker on hold due to hypotension - On heparin drip  # cardiac (possible left atrial) thrombus on imaging -Started on Eliquis as an outpatient on 6/26 -Due to acute on CKD stage III her Eliquis was held and she was placed on heparin gtt -Heparin gtt stopped overnight into 6/29 secondary to bleeding from catheter  - After discussion with cardiology restarted heparin gtt this morning as long as she is hemodynamically stable -She will need 4-6 weeks of uninterrupted full dose anticoagulation followed by a TEE at that time, per cardiology  #Hyponatremia - hemodialysis patient  #Tobacco abuse disorder counseled patient to quit smoking - nicotine patch     All the records are reviewed and case discussed with Care Management/Social Worker. Management plans discussed with the patient, nursing and they are in agreement.  CODE STATUS: Full Code  TOTAL TIME TAKING CARE OF THIS PATIENT: 35 minutes.   More than 50% of the time was spent in counseling/coordination of care: YES  POSSIBLE D/C IN 2-3 DAYS, DEPENDING ON CLINICAL CONDITION.  And nephro and cardiac evaluation   Max Sane M.D on 05/01/2017 at 10:52 AM  Between 7am to 6pm - Pager -  [phone]  After 6pm go to [email or web address] - Proofreader  [facility]  Office  [phone]  CC: Primary care physician; Glendon Axe, MD  Note: This dictation was prepared with Dragon dictation along with smaller phrase technology. Any transcriptional errors that result from this process are unintentional.

## 2017-05-01 NOTE — Consult Note (Signed)
Cardiology Consultation Note  Patient ID: Carly Khan, MRN: [id number], DOB/AGE: 04-11-1951 66 y.o. Admit date: 04/29/2017   Date of Consult: 05/01/2017 Primary Physician: Glendon Axe, MD Primary Cardiologist: Dr. Saunders Revel, MD Requesting Physician: Dr. Manuella Ghazi, MD  Chief Complaint: Abnormal lab Reason for Consult: Left atrial appendage thrombus  HPI: Carly Khan is a 66 y.o. female who is being seen today for the evaluation of possible left atrial appendage thrombus at the request of Dr. Manuella Ghazi, MD. 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   Patient recently moved to [place] from [place] in 11/2016 and had not followed up with a cardiologist in ~ 2-3 years prior to establishing care with Dr. Saunders Revel on 04/28/17. She had recently undergone chest CT by surgery as part of her work up for possible reversal of her colostomy. This incidentally showed a possible left atrial appendage thrombus. This was felt to be less likely to be a soft tissue mass. She was started Eliquis 5 mg bid at that time with plans for follow up TEE in 4-6 weeks. It was also recommended she wear an outpatient cardiac monitor to evaluate for Afib. She had also noted increased DOE that was felt to be 2/2 underlying lung disease.   Patient was admitted on 6/27 for hyperkalemia. She underwent calcium gluconate and emergent dialysis with improvement in potassium and renal function. She was placed on heparin gtt in place of her Elquis in the setting of her AKI. Unfortunately, she developed some bleeding from her temporary dialysis catheter on the night of 6/28 leading into 6/29 requiring the holding of her heparin gtt.  She has not had any further bleeding. It has been felt her AKI may have been 2/2 recent Bactrim use as an outpatient that has since been held. Her BP remains soft. This morning she does not have any cardiac complaints. Echo this admission showed hyperdynamic EF of 65-70%, GR1DD, left atrial appendage was poorly visualized, lipomatous hypertrophy of the septum, mildly dilated pulmonary arteries.     Past Medical History:  Diagnosis Date  . CAD in native artery 02/28/2017  . Cancer of left lung ([place]) 2016   LUL lung resection  . CKD (chronic kidney disease) stage 3, GFR 30-59 ml/min 04/09/2017  . 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 Essential hypertension 04/09/2017  . History of sebaceous cyst 02/28/2017  . Intermittent vertigo 04/09/2017  . Itching 02/28/2017  . S/P CABG (coronary artery bypass graft) 02/26/2017      Most Recent Cardiac Studies: TTE 04/29/2017: Study Conclusions  - Left ventricle: The cavity size was normal. Wall thickness was   increased in a pattern of moderate LVH. Systolic function was   vigorous. The estimated ejection fraction was in the range of 65%   to 70%. Doppler parameters are consistent with abnormal left   ventricular relaxation (grade 1 diastolic dysfunction). Doppler   parameters are consistent with high ventricular filling pressure. - Aortic valve: Trileaflet; mildly thickened leaflets. - Mitral valve: Mildly thickened leaflets . - Left atrium: The left atrial appendage filling defect noted on   yesterday&'s chest CT is not clearly seen, as the left atrial   appendage is poorly visualized on this transthoracic  study. - Atrial septum: There was increased thickness of the septum,   consistent with lipomatous hypertrophy. - Pulmonary arteries: The main pulmonary artery was mildly dilated.   Surgical History:  Past Surgical History:  Procedure Laterality Date  . cardiac bypass  1990  . carpel tunnel    . COLON SURGERY    . COLONOSCOPY    . CT guided needle  placement    . EXPLORATORY LAPAROTOMY    . GALLBLADDER SURGERY    . loop ileostomy    . SIGMOID RESECTION / RECTOPEXY    . THORACOSCOPY    . vaginectomy       Home Meds: Prior to Admission medications   Medication Sig Start Date End Date Taking? Authorizing Provider  apixaban (ELIQUIS) 5 MG TABS tablet Take 1 tablet (5 mg total) by mouth 2 (two) times daily. 04/28/17  Yes End, Harrell Gave, MD  aspirin EC 81 MG tablet Take 1 tablet by mouth daily. 04/09/17 04/09/18 Yes [provider]  metoprolol succinate (TOPROL-XL) 25 MG 24 hr tablet Take 1 tablet by mouth daily. 04/09/17 04/09/18 Yes [provider]  pravastatin (PRAVACHOL) 10 MG tablet Take 10 mg by mouth at bedtime.   Yes [provider]  sulfamethoxazole-trimethoprim (BACTRIM DS,SEPTRA DS) 800-160 MG tablet Take 1 tablet by mouth 2 (two) times daily. 04/24/17  Yes Clayburn Pert, MD  albuterol (PROVENTIL HFA;VENTOLIN HFA) 108 (90 Base) MCG/ACT inhaler Inhale 2 puffs into the lungs every 6 (six) hours as needed. 04/13/17 04/13/18  [provider]  meclizine (ANTIVERT) 25 MG tablet Take 25 mg by mouth 3 (three) times daily as needed for dizziness.    [provider]    Inpatient Medications:  . aspirin EC  81 mg Oral Daily  . benzonatate  100 mg Oral TID  . metoprolol succinate  25 mg Oral Daily  . nicotine  14 mg Transdermal Daily   . sodium chloride    . sodium chloride    . sodium chloride 100 mL/hr at 04/30/17 1815  . heparin Stopped (05/01/17 0400)    Allergies:  Allergies  Allergen Reactions  . Penicillin G Hives and Itching    Social History   Social History  . Marital status: Divorced    Spouse name: N/A  . Number of children: N/A  . Years of education: N/A   Occupational History  . Not on file.   Social History Main Topics  . Smoking status: Current Every Day Smoker    Packs/day: 0.50    Years: 40.00  . Smokeless tobacco: Never Used  . Alcohol use No  . Drug use:  No  . Sexual activity: No   Other Topics Concern  . Not on file   Social History Narrative  . No narrative on file     Family History  Problem Relation Age of Onset  . Cancer Mother   . Heart disease Father   . Hypertension Father      Review of Systems: Review of Systems  Constitutional: Positive for malaise/fatigue. Negative for chills, diaphoresis, fever and weight loss.  HENT: Negative for congestion.   Eyes: Negative for discharge and redness.  Respiratory: Negative for cough, hemoptysis, sputum production, shortness of breath and wheezing.   Cardiovascular: Negative for chest pain, palpitations, orthopnea, claudication, leg swelling and PND.  Gastrointestinal: Negative for abdominal pain, blood in stool, heartburn, melena, nausea and vomiting.  Genitourinary: Negative for hematuria.  Musculoskeletal: Negative for falls and myalgias.  Skin: Negative for rash.  Neurological: Negative for dizziness, tingling, tremors, sensory change, speech change, focal weakness, loss of consciousness and weakness.  Endo/Heme/Allergies: Does not bruise/bleed easily.  Psychiatric/Behavioral: Negative for substance abuse. The patient is not nervous/anxious.   All other systems reviewed and are negative.   Labs: No results for input(s): CKTOTAL, CKMB, TROPONINI in the last 72 hours. Lab Results  Component Value Date   WBC 5.6 05/01/2017   HGB 11.0 (L) 05/01/2017   HCT 30.5 (L) 05/01/2017   MCV 96.9 05/01/2017   PLT 146 (L) 05/01/2017    Recent Labs Lab 05/01/17 0316  NA 132*  K 4.5  CL 108  CO2 17*  BUN 61*  CREATININE 2.66*  CALCIUM 8.1*  GLUCOSE 85   No results found for: CHOL, HDL, LDLCALC, TRIG No results found for: DDIMER  Radiology/Studies:  Ct Chest W Contrast  Addendum Date: 04/28/2017   ADDENDUM REPORT: 04/28/2017 11:46 ADDENDUM: These results will be called to the ordering clinician or representative by the Radiologist Assistant, and communication documented in  the PACS or zVision Dashboard. Electronically Signed   By: Ilona Sorrel M.D.   On: 04/28/2017 11:46   Result Date: 04/28/2017 IMPRESSION: 1. Low-attenuation 1.0 cm filling defect in the left atrial appendage, suggestive of thrombus. Echocardiographic correlation advised. 2. No evidence of local tumor recurrence in the left lung status post left upper lobectomy. 3. No findings of metastatic disease in the chest, abdomen or pelvis. 4. 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 No evidence of bowel obstruction or ischemia. 5. Intact appearing colorectal anastomosis status post subtotal distal colectomy. Minimal residual uncomplicated diverticulosis in the distal colon. 6. Suspected cirrhosis. Consider hepatic elastography for further liver fibrosis risk stratification, as clinically warranted. Aortic Atherosclerosis (ICD10-I70.0) and Emphysema (ICD10-J43.9). Electronically Signed: By: Ilona Sorrel M.D. On: 04/28/2017 11:40   Ct Abdomen Pelvis W Contrast  Addendum Date: 04/28/2017   ADDENDUM REPORT: 04/28/2017 11:46 ADDENDUM: These results will be called to the ordering clinician or representative by the Radiologist Assistant, and communication documented in the PACS or zVision Dashboard. Electronically Signed   By: Ilona Sorrel M.D.   On: 04/28/2017 11:46   Result Date: 04/28/2017 IMPRESSION: 1. Low-attenuation 1.0 cm filling defect in the left atrial appendage, suggestive of thrombus. Echocardiographic correlation advised. 2. No evidence of local tumor recurrence in the left lung status post left upper lobectomy. 3. No findings of metastatic disease in the chest, abdomen or pelvis. 4. 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 No evidence of bowel obstruction or ischemia. 5. Intact appearing colorectal  anastomosis status post subtotal distal colectomy. Minimal residual uncomplicated diverticulosis in the distal colon. 6. Suspected cirrhosis. Consider hepatic elastography for further liver fibrosis risk stratification, as clinically warranted. Aortic Atherosclerosis (ICD10-I70.0) and Emphysema (ICD10-J43.9). Electronically Signed: By: Ilona Sorrel M.D. On: 04/28/2017 11:40   US Renal  Result Date: 04/30/2017 IMPRESSION: 1. No hydronephrosis. 2. Renal parenchymal echogenicity is within normal limits. 3. Bilateral ureteral jets are present. Electronically Signed   By: Ivar Drape M.D.   On: 04/30/2017 11:04   US Renal  Result Date: 04/16/2017 IMPRESSION: Kidneys are rather small, particularly on the right. This is a finding that may be seen with medical renal disease. The renal echogenicity and cortical thickness bilaterally are normal, however. No obstructing focus on either side. Echogenic debris within the  bladder. Question cystitis. Hemorrhage is a less likely differential consideration. Urinary bladder wall has normal thickness. Electronically Signed   By: Lowella Grip III M.D.   On: 04/16/2017 11:29    EKG: Interpreted by me showed: NSR, 77 bpm, prior septal infarct, nonspecific inferolateral st/t changes Telemetry: Interpreted by me showed: NSR  Weights: Filed Weights   04/29/17 1511 04/29/17 2110  Weight: 146 lb (66.2 kg) 147 lb 11.3 oz (67 kg)     Physical Exam: Blood pressure (!) 95/50, pulse 69, temperature 97.9 F (36.6 C), temperature source Oral, resp. rate 20, height 5\' 4"  (1.626 m), weight 147 lb 11.3 oz (67 kg), SpO2 98 %. Body mass index is 25.35 kg/m. General: Well developed, well nourished, in no acute distress. Head: Normocephalic, atraumatic, sclera non-icteric, no xanthomas, nares are without discharge.  Neck: Negative for carotid bruits. JVD not elevated. Lungs: Clear bilaterally to auscultation without wheezes, rales, or rhonchi. Breathing is  unlabored. Heart: RRR with S1 S2. II/VI systolic murmur, no rubs, or gallops appreciated. Abdomen: Soft, non-tender, non-distended with normoactive bowel sounds. No hepatomegaly. No rebound/guarding. No obvious abdominal masses. Colostomy noted. No bleeding noted from the dialysis catheter.  Msk:  Strength and tone appear normal for age. Extremities: No clubbing or cyanosis. No edema. Distal pedal pulses are 2+ and equal bilaterally. Neuro: Alert and oriented X 3. No facial asymmetry. No focal deficit. Moves all extremities spontaneously. Psych:  Responds to questions appropriately with a normal affect.    Assessment and Plan:  Active Problems:   AKI (acute kidney injury) ([place])   Hyperkalemia   Acute renal failure ([place])    1. Possible left atrial thrombus: -Started on Eliquis as an outpatient on 6/26 -Due to acute on CKD stage III her Eliquis was held and she was placed on heparin gtt -Heparin gtt stopped overnight into 6/29 secondary to bleeding from catheter  -As long as H/H remain stable will restart heparin gtt this morning -Will check cbc this afternoon to assess for stable H/H after resuming heparin gtt this morning (discussed with IM) -Will need to assess renal function trend to determine if she will need to go on Coumadin prior to discharge or if she can go back on Eliquis -She will need 4-6 weeks of uninterrupted full dose anticoagulation followed by a TEE at that time -No need for TEE at this time as it would not change her management  2. CAD s/p CABG: -No symptoms concerning for angina -Will be on Coumadin vs Eliquis in place of ASA -Heparin gtt for now -Metoprolol on hold 2/2 hypotension, restart when able -No symptoms concerning for ischemia -No plans for inpatient ischemic evaluation at this time  3. Acute on CKD stage III: -Uncertain etiology at this time -Renal on board -S/p dialysis on 6/27 -Continue to monitor renal function and electrolytes  4.  Hyperkalemia: -Resolved  5. Hypotension: -Of uncertain etiology -Stable -Possible UTI, consider UA -Toprol on hold  6. DOE: -Likely underlying lung disease -Stable -May need ischemic evaluation as an outpatient   7. Palpitations: -Monitor on telemetry -No evidence of Afib at this time -Will need outpatient cardiac monitoring   Signed, Marcille Blanco Carlos Pager: [phone] 05/01/2017, 7:10 AM

## 2017-05-01 NOTE — Progress Notes (Signed)
[facility]  ROUNDING NOTE   Subjective:  Creatinine currently down to 2.66. Urine output was 950 cc over the preceding 24 hours. Serum potassium currently 4.5. Patient resting comfortably in bed at the moment.   Objective:  Vital signs in last 24 hours:  Temp:  [97.9 F (36.6 C)-99.3 F (37.4 C)] 98.1 F (36.7 C) (06/29 1240) Pulse Rate:  [69-81] 76 (06/29 1353) Resp:  [20-24] 20 (06/29 1240) BP: (84-108)/(45-71) 104/53 (06/29 1353) SpO2:  [97 %-99 %] 99 % (06/29 1240)  Weight change:  Filed Weights   04/29/17 1511 04/29/17 2110  Weight: 66.2 kg (146 lb) 67 kg (147 lb 11.3 oz)    Intake/Output: I/O last 3 completed shifts: In: 4098 [P.O.:240; I.V.:1015] Out: 1191 [Urine:1350; Stool:670]   Intake/Output this shift:  Total I/O In: 1188 [P.O.:480; I.V.:708] Out: 450 [Urine:450]  Physical Exam: General: No acute distress  Head: Normocephalic, atraumatic. Moist oral mucosal membranes, poor dentition  Eyes: Anicteric  Neck: Supple, trachea midline  Lungs:  Clear to auscultation, normal effort  Heart: S1S2 no rubs  Abdomen:  Soft, nontender, bowel sounds present, colostomy in place  Extremities: Trace peripheral edema.  Neurologic: Awake, alert, following commands  Skin: No lesions  Access: Right temporary femoral dialysis catheter    Basic Metabolic Panel:  Recent Labs Lab 04/29/17 1228 04/29/17 1514 04/29/17 2138 04/29/17 2341 04/30/17 0356 05/01/17 0316  NA 128* 131*  --  138 137 132*  K 7.3* 7.4*  --  3.3* 3.9 4.5  CL 106 110  --  105 106 108  CO2 13* 11*  --  23 22 17*  GLUCOSE 97 106*  --  80 83 85  BUN 89* 91*  --  46* 50* 61*  CREATININE 5.25* 5.37*  --  2.78* 3.19* 2.66*  CALCIUM 8.9 8.9  --  8.6* 8.8* 8.1*  MG  --  2.7*  --   --   --   --   PHOS  --   --  9.2*  --   --   --     Liver Function Tests: No results for input(s): AST, ALT, ALKPHOS, BILITOT, PROT, ALBUMIN in the last 168 hours. No results for input(s): LIPASE,  AMYLASE in the last 168 hours. No results for input(s): AMMONIA in the last 168 hours.  CBC:  Recent Labs Lab 04/29/17 1228 04/30/17 0356 05/01/17 0316  WBC 9.3 7.7 5.6  NEUTROABS 6.6*  --   --   HGB 14.2 12.4 11.0*  HCT 42.1 34.3* 30.5*  MCV 97.6 95.4 96.9  PLT 237 178 146*    Cardiac Enzymes: No results for input(s): CKTOTAL, CKMB, CKMBINDEX, TROPONINI in the last 168 hours.  BNP: Invalid input(s): POCBNP  CBG:  Recent Labs Lab 04/29/17 2130  GLUCAP 61    Microbiology: Results for orders placed or performed during the hospital encounter of 04/29/17  MRSA PCR Screening     Status: None   Collection Time: 04/30/17 12:16 AM  Result Value Ref Range Status   MRSA by PCR NEGATIVE NEGATIVE Final    Comment:        The GeneXpert MRSA Assay (FDA approved for NASAL specimens only), is one component of a comprehensive MRSA colonization surveillance program. It is not intended to diagnose MRSA infection nor to guide or monitor treatment for MRSA infections.     Coagulation Studies:  Recent Labs  04/29/17 1228  LABPROT 16.3*  INR 1.30    Urinalysis:  Recent Labs  05/01/17  Williams 1.012  PHURINE 5.0  GLUCOSEU NEGATIVE  HGBUR SMALL*  BILIRUBINUR NEGATIVE  KETONESUR NEGATIVE  PROTEINUR NEGATIVE  NITRITE NEGATIVE  LEUKOCYTESUR MODERATE*      Imaging: US Renal  Result Date: 04/30/2017 CLINICAL DATA:  Acute renal insufficiency EXAM: RENAL / URINARY TRACT ULTRASOUND COMPLETE COMPARISON:  CT abdomen and pelvis of 04/28/2017 FINDINGS: Right Kidney: Length: 10.1 cm. No hydronephrosis is seen. The echogenicity of the parenchyma of the kidney is unremarkable. Left Kidney: Length: 9.2 cm.  No hydronephrosis is seen. Bladder: The urinary bladder is unremarkable and moderately distended. Bilateral ureteral jets are present IMPRESSION: 1. No hydronephrosis. 2. Renal parenchymal echogenicity is within normal limits. 3. Bilateral ureteral  jets are present. Electronically Signed   By: Ivar Drape M.D.   On: 04/30/2017 11:04     Medications:   . sodium chloride    . sodium chloride    . sodium chloride 100 mL/hr at 04/30/17 1815  . heparin 950 Units/hr (05/01/17 1312)   . benzonatate  100 mg Oral TID  . fluticasone  1 spray Each Nare BID  . nicotine  14 mg Transdermal Daily   sodium chloride, sodium chloride, acetaminophen **OR** acetaminophen, albuterol, alteplase, heparin, lidocaine (PF), lidocaine-prilocaine, menthol-cetylpyridinium, ondansetron **OR** ondansetron (ZOFRAN) IV, pentafluoroprop-tetrafluoroeth  Assessment/ Plan:  66 y.o. female with a PMHx of asthma, coronary artery disease s/p CABG, epistaxis, COPD, hyperlipidemia, hypertension, peripheral vascular disease status post aortobifemoral bypass, who was admitted to [facility] on 04/29/2017 for evaluation and management of acute renal failure and severe hyperkalemia.   1. Acute renal failure/chronic kidney disease stage III Baseline creatinine 1.7.  Cause of acute renal failure currently unclear. Patient was on Bactrim 1-2 doses. In addition she's had diminished by mouth intake per her report. Renal ultrasound negative for hydronephrosis. - Patient had 1 dialysis treatment. Creatinine appears to have stabilized. No further need for dialysis at the moment. We will go ahead and discontinue the right femoral dialysis catheter.  2. Hyperkalemia. Hyperkalemia was treated with dialysis earlier. Serum potassium of bit higher today at 4.5. Follow-up serum potassium tomorrow.  3. Metabolic acidosis. Acidosis was improved yesterday however serum bicarbonate down to 17 today. Continue to monitor closely.  4. Hypotension.  Blood pressure still remains low at 104/53. Continue IV fluid hydration for now.    LOS: 2 Nidya Bouyer 6/29/20182:34 PM

## 2017-05-01 NOTE — Progress Notes (Signed)
Okay per Dr. Manuella Ghazi to place order for flonase 1 spray 2 x daily at pt request.

## 2017-05-01 NOTE — Progress Notes (Signed)
RN received call from Pharmacy regarding current Heparin dose.  RN informed of issues with bleeding last night, directives to hold and intent for Hospitalist to address this AM if and when it would need to be restarted.

## 2017-05-01 NOTE — Care Management Important Message (Signed)
Important Message  Patient Details  Name: Carly Khan MRN: [id number] Date of Birth: April 10, 1951   Medicare Important Message Given:  Yes    Beverly Sessions, RN 05/01/2017, 4:24 PM

## 2017-05-01 NOTE — Progress Notes (Addendum)
[place] for Heparin Indication: atrial fibrillation/mural thrombus  Allergies  Allergen Reactions  . Penicillin G Hives and Itching    Patient Measurements: Height: 5\' 4"  (162.6 cm) Weight: 147 lb 11.3 oz (67 kg) IBW/kg (Calculated) : 54.7 Heparin Dosing Weight: 66 kg  Vital Signs: Temp: 98.5 F (36.9 C) (06/29 2006) Temp Source: Oral (06/29 2006) BP: 119/66 (06/29 2138) Pulse Rate: 72 (06/29 2138)  Labs:  Recent Labs  04/29/17 1228  04/29/17 2138 04/29/17 2341 04/30/17 0356 05/01/17 0316 05/01/17 1604 05/01/17 2236  HGB 14.2  --   --   --  12.4 11.0* 11.0*  --   HCT 42.1  --   --   --  34.3* 30.5* 30.9*  --   PLT 237  --   --   --  178 146* 144*  --   APTT  --   < > 33  --  78* 29  --  104*  LABPROT 16.3*  --   --   --   --   --   --   --   INR 1.30  --   --   --   --   --   --   --   HEPARINUNFRC  --   --  1.58*  --   --  <0.10*  --   --   CREATININE 5.25*  < >  --  2.78* 3.19* 2.66*  --   --   < > = values in this interval not displayed.  Estimated Creatinine Clearance: 19.3 mL/min (A) (by C-G formula based on SCr of 2.66 mg/dL (H)).   Medical History: Past Medical History:  Diagnosis Date  . CAD in native artery 02/28/2017  . Cancer of left lung ([place]) 2016   LUL lung resection  . CKD (chronic kidney disease) stage 3, GFR 30-59 ml/min 04/09/2017  . 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 Essential hypertension 04/09/2017  . History of sebaceous cyst 02/28/2017  . Intermittent vertigo 04/09/2017  . Itching 02/28/2017  . 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 Heparin held this am secondary to bleeding from catheter site. Heparin resumed at 950 units/hr.   Goal of Therapy:  Heparin level 0.3-0.7 units/ml  APTT 68-109 Monitor platelets by anticoagulation protocol: Yes   Plan:  APTT  is in range. Continue heparin 950 units/hr. Anti-Xa level has likely normalized. Will obtain aPTT and anti-XA level with am labs.   6/30 AM aPTT 107, heparin level 0.49. Both in therapeutic range. Continue current regimen. Will continue to follow by heparin level only. Heparin level and CBC ordered with tomorrow AM labs.  7/2 AM heparin level 3.32. Pt started on apixaban, but heparin level not d/c.  Sim Boast, PharmD, BCPS  05/02/17 4:14 AM

## 2017-05-01 NOTE — Progress Notes (Signed)
Spoke with Dr. Manuella Ghazi in regards to BP and if aspirin should be given. Both orders were discontinued by MD.

## 2017-05-02 LAB — BASIC METABOLIC PANEL
ANION GAP: 2 — AB (ref 5–15)
BUN: 38 mg/dL — ABNORMAL HIGH (ref 6–20)
CALCIUM: 8.2 mg/dL — AB (ref 8.9–10.3)
CHLORIDE: 113 mmol/L — AB (ref 101–111)
CO2: 21 mmol/L — AB (ref 22–32)
CREATININE: 1.59 mg/dL — AB (ref 0.44–1.00)
GFR calc non Af Amer: 33 mL/min — ABNORMAL LOW (ref >60)
GFR, EST AFRICAN AMERICAN: 38 mL/min — AB (ref >60)
Glucose, Bld: 113 mg/dL — ABNORMAL HIGH (ref 65–99)
Potassium: 4.7 mmol/L (ref 3.5–5.1)
SODIUM: 136 mmol/L (ref 135–145)

## 2017-05-02 LAB — CBC
HEMATOCRIT: 31.3 % — AB (ref 35.0–47.0)
HEMOGLOBIN: 10.9 g/dL — AB (ref 12.0–16.0)
MCH: 33.5 pg (ref 26.0–34.0)
MCHC: 34.8 g/dL (ref 32.0–36.0)
MCV: 96.5 fL (ref 80.0–100.0)
Platelets: 135 10*3/uL — ABNORMAL LOW (ref 150–440)
RBC: 3.25 MIL/uL — ABNORMAL LOW (ref 3.80–5.20)
RDW: 12.1 % (ref 11.5–14.5)
WBC: 5.5 10*3/uL (ref 3.6–11.0)

## 2017-05-02 LAB — APTT: APTT: 107 s — AB (ref 24–36)

## 2017-05-02 LAB — HEPARIN LEVEL (UNFRACTIONATED): HEPARIN UNFRACTIONATED: 0.49 [IU]/mL (ref 0.30–0.70)

## 2017-05-02 NOTE — Progress Notes (Signed)
[facility] [place] NAME: Carly Khan    MR#:  [id number]  DATE OF BIRTH:  October 08, 1951  SUBJECTIVE:  CHIEF COMPLAINT:   Chief Complaint  Patient presents with  . Abnormal Lab   Feeling better, States no further hematuria Concern about blood pressure being low  REVIEW OF SYSTEMS:  Review of Systems  Constitutional: Positive for malaise/fatigue. Negative for chills, fever and weight loss.  HENT: Negative for nosebleeds and sore throat.   Eyes: Negative for blurred vision.  Respiratory: Negative for cough, shortness of breath and wheezing.   Cardiovascular: Negative for chest pain, orthopnea, leg swelling and PND.  Gastrointestinal: Negative for abdominal pain, constipation, diarrhea, heartburn, nausea and vomiting.  Genitourinary: Negative for dysuria, hematuria and urgency.  Musculoskeletal: Negative for back pain.  Skin: Negative for rash.  Neurological: Positive for weakness. Negative for dizziness, speech change, focal weakness and headaches.  Endo/Heme/Allergies: Does not bruise/bleed easily.  Psychiatric/Behavioral: Negative for depression.   DRUG ALLERGIES:   Allergies  Allergen Reactions  . Penicillin G Hives and Itching   VITALS:  Blood pressure 130/70, pulse 66, temperature 97.9 F (36.6 C), temperature source Oral, resp. rate 19, height 5\' 4"  (1.626 m), weight 147 lb 11.3 oz (67 kg), SpO2 99 %. PHYSICAL EXAMINATION:  Physical Exam  Constitutional: She is oriented to person, place, and time and well-developed, well-nourished, and in no distress.  HENT:  Head: Normocephalic and atraumatic.  Eyes: Conjunctivae and EOM are normal. Pupils are equal, round, and reactive to light.  Neck: Normal range of motion. Neck supple. No tracheal deviation present. No thyromegaly present.  Cardiovascular: Normal rate, regular rhythm and normal heart sounds.   Pulmonary/Chest: Effort normal and breath sounds normal. No respiratory  distress. She has no wheezes. She exhibits no tenderness.  Abdominal: Soft. Bowel sounds are normal. She exhibits no distension. There is no tenderness.  Musculoskeletal: Normal range of motion.  Neurological: She is alert and oriented to person, place, and time. No cranial nerve deficit.  Skin: Skin is warm and dry. No rash noted.  Psychiatric: Mood and affect normal.   LABORATORY PANEL:  Female CBC  Recent Labs Lab 05/02/17 0305  WBC 5.5  HGB 10.9*  HCT 31.3*  PLT 135*   ------------------------------------------------------------------------------------------------------------------ Chemistries   Recent Labs Lab 04/29/17 1514  05/02/17 0305  NA 131*  < > 136  K 7.4*  < > 4.7  CL 110  < > 113*  CO2 11*  < > 21*  GLUCOSE 106*  < > 113*  BUN 91*  < > 38*  CREATININE 5.37*  < > 1.59*  CALCIUM 8.9  < > 8.2*  MG 2.7*  --   --   < > = values in this interval not displayed. RADIOLOGY:  No results found. ASSESSMENT AND PLAN:  Carly Khan  is a 66 y.o. female with a known history of Coronary artery disease disease status post quadruple CABG, chronic kidney disease stage III, history of lung cancer status post pneumonectomy on the left, chronic diverticulitis status post colostomy was recently moved from [place] to [place] and was seen by Dr. Adonis Huguenin for her colostomy reversal. She was referred to cardiology Dr. Saunders Revel for cardiac clearance. Initial lab work was abnormal and patient was sent to the ED, repeat labs in the emergency department has revealed potassium is 7.4 and sodium 131 creatinine 5.4, no EKG changes were noticed.  #Acute on CKD 3 with hyperkalemia secondary to  ATN could be from Bactrim use - Renal function back to baseline Stop IV fluids  #Coronary artery disease status post CABG Patient denies any chest pain  Continue aspirin, beta blocker on hold due to hypotension - On heparin drip  # cardiac (possible left atrial) thrombus on imaging -Started on  Eliquis as an outpatient on 6/26 -Due to acute on CKD stage III her Eliquis was held and she was placed on heparin gtt -Heparin gtt stopped overnight into 6/29 secondary to bleeding from catheter  - After discussion with cardiology restarted heparin gtt, can restart liquids tomorrow -TEE Monday  #Hyponatremia - hemodialysis patient  #Tobacco abuse disorder counseled patient to quit smoking - nicotine patch     All the records are reviewed and case discussed with Care Management/Social Worker. Management plans discussed with the patient, nursing and they are in agreement.  CODE STATUS: Full Code  TOTAL TIME TAKING CARE OF THIS PATIENT: 30 minutes.   More than 50% of the time was spent in counseling/coordination of care: YES  POSSIBLE D/C IN 2-3 DAYS, DEPENDING ON CLINICAL CONDITION.  And nephro and cardiac evaluation   Dustin Flock M.D on 05/02/2017 at 2:17 PM  Between 7am to 6pm - Pager - [phone]  After 6pm go to [email or web address] - Proofreader  [facility]  Office  [phone]  CC: Primary care physician; Glendon Axe, MD  Note: This dictation was prepared with Dragon dictation along with smaller phrase technology. Any transcriptional errors that result from this process are unintentional.

## 2017-05-02 NOTE — Progress Notes (Signed)
[facility]  ROUNDING NOTE   Subjective:  Renal patient has significantly improved over the past 24 hours. Creatinine down to 1.59. Good urine output at 1.7 L over the preceding 24 hours.   Objective:  Vital signs in last 24 hours:  Temp:  [97.4 F (36.3 C)-98.5 F (36.9 C)] 97.7 F (36.5 C) (06/30 0852) Pulse Rate:  [64-81] 64 (06/30 0852) Resp:  [18-20] 18 (06/30 0852) BP: (88-132)/(53-69) 132/69 (06/30 0852) SpO2:  [98 %-100 %] 100 % (06/30 0852)  Weight change:  Filed Weights   04/29/17 1511 04/29/17 2110  Weight: 66.2 kg (146 lb) 67 kg (147 lb 11.3 oz)    Intake/Output: I/O last 3 completed shifts: In: 3752 [P.O.:780; I.V.:2972] Out: 4098 [Urine:2400; Stool:920]   Intake/Output this shift:  Total I/O In: 640 [P.O.:240; I.V.:400] Out: 400 [Urine:400]  Physical Exam: General: No acute distress  Head: Normocephalic, atraumatic. Moist oral mucosal membranes, poor dentition  Eyes: Anicteric  Neck: Supple, trachea midline  Lungs:  Clear to auscultation, normal effort  Heart: S1S2 no rubs  Abdomen:  Soft, nontender, bowel sounds present, colostomy in place  Extremities: Trace peripheral edema.  Neurologic: Awake, alert, following commands  Skin: No lesions  Access: Right temporary femoral dialysis catheter removed now    Basic Metabolic Panel:  Recent Labs Lab 04/29/17 1514 04/29/17 2138 04/29/17 2341 04/30/17 0356 05/01/17 0316 05/02/17 0305  NA 131*  --  138 137 132* 136  K 7.4*  --  3.3* 3.9 4.5 4.7  CL 110  --  105 106 108 113*  CO2 11*  --  23 22 17* 21*  GLUCOSE 106*  --  80 83 85 113*  BUN 91*  --  46* 50* 61* 38*  CREATININE 5.37*  --  2.78* 3.19* 2.66* 1.59*  CALCIUM 8.9  --  8.6* 8.8* 8.1* 8.2*  MG 2.7*  --   --   --   --   --   PHOS  --  9.2*  --   --   --   --     Liver Function Tests: No results for input(s): AST, ALT, ALKPHOS, BILITOT, PROT, ALBUMIN in the last 168 hours. No results for input(s): LIPASE, AMYLASE in  the last 168 hours. No results for input(s): AMMONIA in the last 168 hours.  CBC:  Recent Labs Lab 04/29/17 1228 04/30/17 0356 05/01/17 0316 05/01/17 1604 05/02/17 0305  WBC 9.3 7.7 5.6 4.7 5.5  NEUTROABS 6.6*  --   --   --   --   HGB 14.2 12.4 11.0* 11.0* 10.9*  HCT 42.1 34.3* 30.5* 30.9* 31.3*  MCV 97.6 95.4 96.9 95.9 96.5  PLT 237 178 146* 144* 135*    Cardiac Enzymes: No results for input(s): CKTOTAL, CKMB, CKMBINDEX, TROPONINI in the last 168 hours.  BNP: Invalid input(s): POCBNP  CBG:  Recent Labs Lab 04/29/17 2130  GLUCAP 58    Microbiology: Results for orders placed or performed during the hospital encounter of 04/29/17  MRSA PCR Screening     Status: None   Collection Time: 04/30/17 12:16 AM  Result Value Ref Range Status   MRSA by PCR NEGATIVE NEGATIVE Final    Comment:        The GeneXpert MRSA Assay (FDA approved for NASAL specimens only), is one component of a comprehensive MRSA colonization surveillance program. It is not intended to diagnose MRSA infection nor to guide or monitor treatment for MRSA infections.     Coagulation Studies:  Recent Labs  04/29/17 1228  LABPROT 16.3*  INR 1.30    Urinalysis:  Recent Labs  05/01/17 1311  COLORURINE YELLOW*  LABSPEC 1.012  PHURINE 5.0  GLUCOSEU NEGATIVE  HGBUR SMALL*  BILIRUBINUR NEGATIVE  KETONESUR NEGATIVE  PROTEINUR NEGATIVE  NITRITE NEGATIVE  LEUKOCYTESUR MODERATE*      Imaging: No results found.   Medications:   . sodium chloride    . sodium chloride    . sodium chloride 100 mL/hr at 05/02/17 0948  . heparin 950 Units/hr (05/01/17 1514)   . benzonatate  100 mg Oral TID  . fluticasone  1 spray Each Nare BID  . nicotine  14 mg Transdermal Daily   sodium chloride, sodium chloride, acetaminophen **OR** acetaminophen, albuterol, alteplase, heparin, lidocaine (PF), lidocaine-prilocaine, menthol-cetylpyridinium, ondansetron **OR** ondansetron (ZOFRAN) IV,  pentafluoroprop-tetrafluoroeth  Assessment/ Plan:  66 y.o. female with a PMHx of asthma, coronary artery disease s/p CABG, epistaxis, COPD, hyperlipidemia, hypertension, peripheral vascular disease status post aortobifemoral bypass, who was admitted to [facility] on 04/29/2017 for evaluation and management of acute renal failure and severe hyperkalemia.   1. Acute renal failure/chronic kidney disease stage III Baseline creatinine 1.7.  Cause of acute renal failure currently unclear. Patient was on Bactrim 1-2 doses. In addition she's had diminished by mouth intake per her report. Renal ultrasound negative for hydronephrosis. - Patient tolerated 1 dialysis treatment quite well. Thereafter her temporary dialysis catheter was removed. BUN currently down to 38 with a creatinine of 1.59 and urine output of 1.7 L over the preceding 24 hours.  2. Hyperkalemia. Resolved with dialysis. Potassium currently 4.7.  3. Metabolic acidosis. Serum bicarbonate up to 21. Continue to monitor.  4. Hypotension.  Now appears resolved. Blood pressure currently 132/69. Continue IV fluid hydration for 1 additional day.    LOS: 3 Jermani Eberlein 6/30/201811:12 AM

## 2017-05-02 NOTE — Progress Notes (Signed)
Progress Note  Patient Name: Carly Khan Date of Encounter: 05/02/2017  Primary Cardiologist: CE  Primary Electrophysiologist: none   Patient Profile     66 y.o. female with known coronary artery disease prior CABG in 1991 and possible left atrial thrombus, the concern had been raised by CT scanning. She has no history of atrial fibrillation. Transthoracic echo was on helpful. TEE was scheduled. Subsequently, she was admitted for hyperkalemia and acute renal failure requiring transient dialysis. Intercurrently she's been managed with a heparin drip.  Worsening DOE  Recurrent presyncope  Subjective   The patient denies chest pain, shortness of breath, nocturnal dyspnea, orthopnea or peripheral edema.  There have been no palpitations, lightheadedness or syncope.    Some hematuria   Inpatient Medications    Scheduled Meds: . benzonatate  100 mg Oral TID  . fluticasone  1 spray Each Nare BID  . nicotine  14 mg Transdermal Daily   Continuous Infusions: . sodium chloride    . sodium chloride    . sodium chloride 100 mL/hr at 05/02/17 0948  . heparin 950 Units/hr (05/01/17 1514)   PRN Meds: sodium chloride, sodium chloride, acetaminophen **OR** acetaminophen, albuterol, alteplase, heparin, lidocaine (PF), lidocaine-prilocaine, menthol-cetylpyridinium, ondansetron **OR** ondansetron (ZOFRAN) IV, pentafluoroprop-tetrafluoroeth   Vital Signs    Vitals:   05/01/17 2006 05/01/17 2138 05/02/17 0426 05/02/17 0852  BP: (!) 112/55 119/66 110/61 132/69  Pulse: 72 72 68 64  Resp: 20  20 18   Temp: 98.5 F (36.9 C)  97.4 F (36.3 C) 97.7 F (36.5 C)  TempSrc: Oral  Oral Oral  SpO2: 100% 100% 98% 100%  Weight:      Height:        Intake/Output Summary (Last 24 hours) at 05/02/17 1040 Last data filed at 05/02/17 0930  Gross per 24 hour  Intake          2843.96 ml  Output             2150 ml  Net           693.96 ml   Filed Weights   04/29/17 1511 04/29/17 2110  Weight: 146  lb (66.2 kg) 147 lb 11.3 oz (67 kg)    Telemetry    nsr - Personally Reviewed  ECG     Physical Exam   GEN: No acute distress.   Neck: JVD flat Cardiac: RRR, no  murmurs, rubs, or gallops.  Respiratory: Clear to auscultation bilaterally. GI: Soft, nontender, non-distended  MS:  edema; No deformity. Neuro:  Nonfocal  Psych: Normal affect  Skin Warm and dry   Labs    Chemistry Recent Labs Lab 04/30/17 0356 05/01/17 0316 05/02/17 0305  NA 137 132* 136  K 3.9 4.5 4.7  CL 106 108 113*  CO2 22 17* 21*  GLUCOSE 83 85 113*  BUN 50* 61* 38*  CREATININE 3.19* 2.66* 1.59*  CALCIUM 8.8* 8.1* 8.2*  GFRNONAA 14* 17* 33*  GFRAA 16* 20* 38*  ANIONGAP 9 7 2*     Hematology Recent Labs Lab 05/01/17 0316 05/01/17 1604 05/02/17 0305  WBC 5.6 4.7 5.5  RBC 3.15* 3.22* 3.25*  HGB 11.0* 11.0* 10.9*  HCT 30.5* 30.9* 31.3*  MCV 96.9 95.9 96.5  MCH 34.9* 34.1* 33.5  MCHC 36.0 35.6 34.8  RDW 12.1 11.9 12.1  PLT 146* 144* 135*    Cardiac EnzymesNo results for input(s): TROPONINI in the last 168 hours. No results for input(s): TROPIPOC in the last 168 hours.  BNPNo results for input(s): BNP, PROBNP in the last 168 hours.   DDimer No results for input(s): DDIMER in the last 168 hours.   Radiology    US Renal  Result Date: 04/30/2017 CLINICAL DATA:  Acute renal insufficiency EXAM: RENAL / URINARY TRACT ULTRASOUND COMPLETE COMPARISON:  CT abdomen and pelvis of 04/28/2017 FINDINGS: Right Kidney: Length: 10.1 cm. No hydronephrosis is seen. The echogenicity of the parenchyma of the kidney is unremarkable. Left Kidney: Length: 9.2 cm.  No hydronephrosis is seen. Bladder: The urinary bladder is unremarkable and moderately distended. Bilateral ureteral jets are present IMPRESSION: 1. No hydronephrosis. 2. Renal parenchymal echogenicity is within normal limits. 3. Bilateral ureteral jets are present. Electronically Signed   By: Ivar Drape M.D.   On: 04/30/2017 11:04    Cardiac  Studies   Echo EF 65-70 LA size normal     Assessment & Plan    LA filling defect ? Clot CAD s/p CABG Acute renal failure improving  Plan will be TEE either mon or as outpt wll plan tresume apixoban in am Signed, Virl Axe, MD  05/02/2017, 10:40 AM

## 2017-05-03 DIAGNOSIS — I519 Heart disease, unspecified: Secondary | ICD-10-CM

## 2017-05-03 DIAGNOSIS — E875 Hyperkalemia: Secondary | ICD-10-CM

## 2017-05-03 LAB — BASIC METABOLIC PANEL
ANION GAP: 5 (ref 5–15)
BUN: 25 mg/dL — ABNORMAL HIGH (ref 6–20)
CALCIUM: 9 mg/dL (ref 8.9–10.3)
CO2: 19 mmol/L — ABNORMAL LOW (ref 22–32)
Chloride: 114 mmol/L — ABNORMAL HIGH (ref 101–111)
Creatinine, Ser: 1.2 mg/dL — ABNORMAL HIGH (ref 0.44–1.00)
GFR, EST AFRICAN AMERICAN: 53 mL/min — AB (ref >60)
GFR, EST NON AFRICAN AMERICAN: 46 mL/min — AB (ref >60)
GLUCOSE: 89 mg/dL (ref 65–99)
Potassium: 4.6 mmol/L (ref 3.5–5.1)
Sodium: 138 mmol/L (ref 135–145)

## 2017-05-03 LAB — CBC
HCT: 33 % — ABNORMAL LOW (ref 35.0–47.0)
Hemoglobin: 11.5 g/dL — ABNORMAL LOW (ref 12.0–16.0)
MCH: 33.8 pg (ref 26.0–34.0)
MCHC: 35 g/dL (ref 32.0–36.0)
MCV: 96.6 fL (ref 80.0–100.0)
Platelets: 138 10*3/uL — ABNORMAL LOW (ref 150–440)
RBC: 3.41 MIL/uL — ABNORMAL LOW (ref 3.80–5.20)
RDW: 12 % (ref 11.5–14.5)
WBC: 6.3 10*3/uL (ref 3.6–11.0)

## 2017-05-03 LAB — CORTISOL: Cortisol, Plasma: 7.3 ug/dL

## 2017-05-03 LAB — HEPARIN LEVEL (UNFRACTIONATED): HEPARIN UNFRACTIONATED: 0.55 [IU]/mL (ref 0.30–0.70)

## 2017-05-03 MED ORDER — APIXABAN 5 MG PO TABS
5.0000 mg | ORAL_TABLET | Freq: Two times a day (BID) | ORAL | Status: DC
  Administered 2017-05-03 – 2017-05-04 (×3): 5 mg via ORAL
  Filled 2017-05-03 (×3): qty 1

## 2017-05-03 NOTE — Progress Notes (Signed)
[facility] [place] NAME: Carly Khan    MR#:  [id number]  DATE OF BIRTH:  1950/11/27  SUBJECTIVE:  CHIEF COMPLAINT:   Chief Complaint  Patient presents with  . Abnormal Lab   No further hematuria patient states that she's feeling well still on the heparin drip  REVIEW OF SYSTEMS:  Review of Systems  Constitutional: Negative for chills, fever, malaise/fatigue and weight loss.  HENT: Negative for nosebleeds and sore throat.   Eyes: Negative for blurred vision.  Respiratory: Negative for cough, shortness of breath and wheezing.   Cardiovascular: Negative for chest pain, orthopnea, leg swelling and PND.  Gastrointestinal: Negative for abdominal pain, constipation, diarrhea, heartburn, nausea and vomiting.  Genitourinary: Negative for dysuria, hematuria and urgency.  Musculoskeletal: Negative for back pain.  Skin: Negative for rash.  Neurological: Negative for dizziness, speech change, focal weakness, weakness and headaches.  Endo/Heme/Allergies: Does not bruise/bleed easily.  Psychiatric/Behavioral: Negative for depression.   DRUG ALLERGIES:   Allergies  Allergen Reactions  . Penicillin G Hives and Itching   VITALS:  Blood pressure 128/61, pulse 66, temperature 98.4 F (36.9 C), temperature source Oral, resp. rate 18, height 5\' 4"  (1.626 m), weight 147 lb 11.3 oz (67 kg), SpO2 99 %. PHYSICAL EXAMINATION:  Physical Exam  Constitutional: She is oriented to person, place, and time and well-developed, well-nourished, and in no distress.  HENT:  Head: Normocephalic and atraumatic.  Eyes: Conjunctivae and EOM are normal. Pupils are equal, round, and reactive to light.  Neck: Normal range of motion. Neck supple. No tracheal deviation present. No thyromegaly present.  Cardiovascular: Normal rate, regular rhythm and normal heart sounds.   Pulmonary/Chest: Effort normal and breath sounds normal. No respiratory distress. She has no  wheezes. She exhibits no tenderness.  Abdominal: Soft. Bowel sounds are normal. She exhibits no distension. There is no tenderness.  Musculoskeletal: Normal range of motion.  Neurological: She is alert and oriented to person, place, and time. No cranial nerve deficit.  Skin: Skin is warm and dry. No rash noted.  Psychiatric: Mood and affect normal.   LABORATORY PANEL:  Female CBC  Recent Labs Lab 05/03/17 0450  WBC 6.3  HGB 11.5*  HCT 33.0*  PLT 138*   ------------------------------------------------------------------------------------------------------------------ Chemistries   Recent Labs Lab 04/29/17 1514  05/03/17 0450  NA 131*  < > 138  K 7.4*  < > 4.6  CL 110  < > 114*  CO2 11*  < > 19*  GLUCOSE 106*  < > 89  BUN 91*  < > 25*  CREATININE 5.37*  < > 1.20*  CALCIUM 8.9  < > 9.0  MG 2.7*  --   --   < > = values in this interval not displayed. RADIOLOGY:  No results found. ASSESSMENT AND PLAN:  Carly Khan  is a 66 y.o. female with a known history of Coronary artery disease disease status post quadruple CABG, chronic kidney disease stage III, history of lung cancer status post pneumonectomy on the left, chronic diverticulitis status post colostomy was recently moved from [place] to [place] and was seen by Dr. Adonis Huguenin for her colostomy reversal. She was referred to cardiology Dr. Saunders Revel for cardiac clearance. Initial lab work was abnormal and patient was sent to the ED, repeat labs in the emergency department has revealed potassium is 7.4 and sodium 131 creatinine 5.4, no EKG changes were noticed.  #Acute on CKD 3 with hyperkalemia secondary to ATN could  be from Bactrim use - Renal function back to baseline   #Coronary artery disease status post CABG Patient denies any chest pain  Continue aspirin, beta blocker on hold due to hypotension - On heparin drip  # cardiac (possible left atrial) thrombus on imaging -Started on Eliquis as an outpatient on 6/26 -Due  to acute on CKD stage III her Eliquis was held and she was placed on heparin gtt -Heparin gtt stopped overnight into 6/29 secondary to bleeding from catheter  - After discussion with cardiology restarted heparin gtt, can restart liquids tomorrow -TEE Monday  #Hyponatremia - hemodialysis patient  #Tobacco abuse disorder counseled patient to quit smoking - nicotine patch     All the records are reviewed and case discussed with Care Management/Social Worker. Management plans discussed with the patient, nursing and they are in agreement.  CODE STATUS: Full Code  TOTAL TIME TAKING CARE OF THIS PATIENT: 30 minutes.   More than 50% of the time was spent in counseling/coordination of care: YES  POSSIBLE D/C IN 2-3 DAYS, DEPENDING ON CLINICAL CONDITION.  And nephro and cardiac evaluation   Dustin Flock M.D on 05/03/2017 at 11:14 AM  Between 7am to 6pm - Pager - [phone]  After 6pm go to [email or web address] - Proofreader  [facility]  Office  [phone]  CC: Primary care physician; Glendon Axe, MD  Note: This dictation was prepared with Dragon dictation along with smaller phrase technology. Any transcriptional errors that result from this process are unintentional.

## 2017-05-03 NOTE — Progress Notes (Signed)
Progress Note  Patient Name: Carly Khan Date of Encounter: 05/03/2017  Primary Cardiologist: CE  Primary Electrophysiologist: none   Patient Profile     66 y.o. female with known coronary artery disease prior CABG in 1991 and possible left atrial thrombus, the concern had been raised by CT scanning. She has no history of atrial fibrillation. Transthoracic echo was on helpful. TEE was scheduled. Subsequently, she was admitted for hyperkalemia and acute renal failure requiring transient dialysis. Intercurrently she's been managed with a heparin drip.  Worsening DOE  Recurrent presyncope  Subjective   The patient denies chest pain, shortness of breath, nocturnal dyspnea, orthopnea or peripheral edema.  There have been no palpitations, lightheadedness or syncope.    Some hematuria   Inpatient Medications    Scheduled Meds: . apixaban  5 mg Oral BID  . benzonatate  100 mg Oral TID  . fluticasone  1 spray Each Nare BID  . nicotine  14 mg Transdermal Daily   Continuous Infusions: . sodium chloride    . sodium chloride     PRN Meds: sodium chloride, sodium chloride, acetaminophen **OR** acetaminophen, albuterol, alteplase, heparin, lidocaine (PF), lidocaine-prilocaine, menthol-cetylpyridinium, ondansetron **OR** ondansetron (ZOFRAN) IV, pentafluoroprop-tetrafluoroeth   Vital Signs    Vitals:   05/02/17 0852 05/02/17 1223 05/02/17 2034 05/03/17 0356  BP: 132/69 130/70 129/77 128/61  Pulse: 64 66 66 66  Resp: 18 19 16 18   Temp: 97.7 F (36.5 C) 97.9 F (36.6 C) 98.5 F (36.9 C) 98.4 F (36.9 C)  TempSrc: Oral Oral Oral Oral  SpO2: 100% 99% 100% 99%  Weight:      Height:        Intake/Output Summary (Last 24 hours) at 05/03/17 1143 Last data filed at 05/03/17 0849  Gross per 24 hour  Intake           910.79 ml  Output             1335 ml  Net          -424.21 ml   Filed Weights   04/29/17 1511 04/29/17 2110  Weight: 146 lb (66.2 kg) 147 lb 11.3 oz (67 kg)     Telemetry     Telemetry Personally reviewed  No afib  ECG     Physical Exam   GEN: No acute distress.   Neck: JVD flat Cardiac: RRR, no  murmurs, rubs, or gallops.  Respiratory: Clear to auscultation bilaterally. GI: Soft, nontender, non-distended  MS:  edema; No deformity. Neuro:  Nonfocal  Psych: Normal affect  Skin Warm and dry   Labs    Chemistry  Recent Labs Lab 05/01/17 0316 05/02/17 0305 05/03/17 0450  NA 132* 136 138  K 4.5 4.7 4.6  CL 108 113* 114*  CO2 17* 21* 19*  GLUCOSE 85 113* 89  BUN 61* 38* 25*  CREATININE 2.66* 1.59* 1.20*  CALCIUM 8.1* 8.2* 9.0  GFRNONAA 17* 33* 46*  GFRAA 20* 38* 53*  ANIONGAP 7 2* 5     Hematology  Recent Labs Lab 05/01/17 1604 05/02/17 0305 05/03/17 0450  WBC 4.7 5.5 6.3  RBC 3.22* 3.25* 3.41*  HGB 11.0* 10.9* 11.5*  HCT 30.9* 31.3* 33.0*  MCV 95.9 96.5 96.6  MCH 34.1* 33.5 33.8  MCHC 35.6 34.8 35.0  RDW 11.9 12.1 12.0  PLT 144* 135* 138*    Cardiac EnzymesNo results for input(s): TROPONINI in the last 168 hours. No results for input(s): TROPIPOC in the last 168 hours.  BNPNo results for input(s): BNP, PROBNP in the last 168 hours.   DDimer No results for input(s): DDIMER in the last 168 hours.   Radiology    No results found.  Cardiac Studies   Echo EF 65-70 LA size normal     Assessment & Plan    LA filling defect ? Clot CAD s/p CABG Acute renal failure  Continuing to improve  Plan will be TEE either mon or as outpt apixoban resumed  Signed, Virl Axe, MD  05/03/2017, 11:43 AM

## 2017-05-03 NOTE — Progress Notes (Deleted)
[place] [facility] [place] NAME: Jaicee Michelotti    MR#:  [id number]  DATE OF BIRTH:  [birth year]  SUBJECTIVE:  CHIEF COMPLAINT:   Chief Complaint  Patient presents with  . Abnormal Lab   Patient feeling much better denies any chest pain or shortness of breath no further hematuria  REVIEW OF SYSTEMS:  Review of Systems  Constitutional: Negative for chills, fever, malaise/fatigue and weight loss.  HENT: Negative for nosebleeds and sore throat.   Eyes: Negative for blurred vision.  Respiratory: Negative for cough, shortness of breath and wheezing.   Cardiovascular: Negative for chest pain, orthopnea, leg swelling and PND.  Gastrointestinal: Negative for abdominal pain, constipation, diarrhea, heartburn, nausea and vomiting.  Genitourinary: Negative for dysuria, hematuria and urgency.  Musculoskeletal: Negative for back pain.  Skin: Negative for rash.  Neurological: Negative for dizziness, speech change, focal weakness, weakness and headaches.  Endo/Heme/Allergies: Does not bruise/bleed easily.  Psychiatric/Behavioral: Negative for depression.   DRUG ALLERGIES:   Allergies  Allergen Reactions  . Penicillin G Hives and Itching   VITALS:  Blood pressure 128/61, pulse 66, temperature 98.4 F (36.9 C), temperature source Oral, resp. rate 18, height 5\' 4"  (1.626 m), weight 147 lb 11.3 oz (67 kg), SpO2 99 %. PHYSICAL EXAMINATION:  Physical Exam  Constitutional: She is oriented to person, place, and time and well-developed, well-nourished, and in no distress.  HENT:  Head: Normocephalic and atraumatic.  Eyes: Conjunctivae and EOM are normal. Pupils are equal, round, and reactive to light.  Neck: Normal range of motion. Neck supple. No tracheal deviation present. No thyromegaly present.  Cardiovascular: Normal rate, regular rhythm and normal heart sounds.   Pulmonary/Chest: Effort normal and breath sounds normal. No respiratory distress. She has  no wheezes. She exhibits no tenderness.  Abdominal: Soft. Bowel sounds are normal. She exhibits no distension. There is no tenderness.  Musculoskeletal: Normal range of motion.  Neurological: She is alert and oriented to person, place, and time. No cranial nerve deficit.  Skin: Skin is warm and dry. No rash noted.  Psychiatric: Mood and affect normal.   LABORATORY PANEL:  Female CBC  Recent Labs Lab 05/03/17 0450  WBC 6.3  HGB 11.5*  HCT 33.0*  PLT 138*   ------------------------------------------------------------------------------------------------------------------ Chemistries   Recent Labs Lab 04/29/17 1514  05/03/17 0450  NA 131*  < > 138  K 7.4*  < > 4.6  CL 110  < > 114*  CO2 11*  < > 19*  GLUCOSE 106*  < > 89  BUN 91*  < > 25*  CREATININE 5.37*  < > 1.20*  CALCIUM 8.9  < > 9.0  MG 2.7*  --   --   < > = values in this interval not displayed. RADIOLOGY:  No results found. ASSESSMENT AND PLAN:  Jara Feider  is a 66 y.o. female with a known history of Coronary artery disease disease status post quadruple CABG, chronic kidney disease stage III, history of lung cancer status post pneumonectomy on the left, chronic diverticulitis status post colostomy was recently moved from [place] to [place] and was seen by Dr. Adonis Huguenin for her colostomy reversal. She was referred to cardiology Dr. Saunders Revel for cardiac clearance. Initial lab work was abnormal and patient was sent to the ED, repeat labs in the emergency department has revealed potassium is 7.4 and sodium 131 creatinine 5.4, no EKG changes were noticed.  #Acute on CKD 3 with hyperkalemia secondary to ATN  could be from Bactrim use - Renal function back to baseline  #Coronary artery disease status post CABG Patient denies any chest pain  Continue aspirin, beta blocker on hold due to hypotension   # cardiac (possible left atrial) thrombus on imaging -Started on Eliquis as an outpatient on 6/26 -Due to acute on CKD  stage III her Eliquis was held and she was placed on heparin gtt -I will stop heparin and we will restart L a course -TEE Monday  #Hyponatremia - hemodialysis patient  #Tobacco abuse disorder counseled patient to quit smoking - nicotine patch     All the records are reviewed and case discussed with Care Management/Social Worker. Management plans discussed with the patient, nursing and they are in agreement.  CODE STATUS: Full Code  TOTAL TIME TAKING CARE OF THIS PATIENT: 30 minutes.   More than 50% of the time was spent in counseling/coordination of care: YES  POSSIBLE D/C IN 2-3 DAYS, DEPENDING ON CLINICAL CONDITION.  And nephro and cardiac evaluation   Dustin Flock M.D on 05/03/2017 at 11:47 AM  Between 7am to 6pm - Pager - [phone]  After 6pm go to [email or web address] - Proofreader  [facility]  Office  [phone]  CC: Primary care physician; Glendon Axe, MD  Note: This dictation was prepared with Dragon dictation along with smaller phrase technology. Any transcriptional errors that result from this process are unintentional.

## 2017-05-03 NOTE — Progress Notes (Signed)
[facility]  ROUNDING NOTE   Subjective:  Overall patient continues to do quite well. Creatinine down to 1.2. Urine output was 1.4 L over the preceding 24 hours. Patient sitting up in a chair this a.m.   Objective:  Vital signs in last 24 hours:  Temp:  [97.9 F (36.6 C)-98.5 F (36.9 C)] 98.4 F (36.9 C) (07/01 0356) Pulse Rate:  [66] 66 (07/01 0356) Resp:  [16-19] 18 (07/01 0356) BP: (128-130)/(61-77) 128/61 (07/01 0356) SpO2:  [99 %-100 %] 99 % (07/01 0356)  Weight change:  Filed Weights   04/29/17 1511 04/29/17 2110  Weight: 66.2 kg (146 lb) 67 kg (147 lb 11.3 oz)    Intake/Output: I/O last 3 completed shifts: In: 2840.8 [P.O.:880; I.V.:1960.8] Out: 2635 [Urine:1910; Stool:725]   Intake/Output this shift:  Total I/O In: -  Out: 200 [Stool:200]  Physical Exam: General: No acute distress  Head: Normocephalic, atraumatic. Moist oral mucosal membranes, poor dentition  Eyes: Anicteric  Neck: Supple, trachea midline  Lungs:  Clear to auscultation, normal effort  Heart: S1S2 no rubs  Abdomen:  Soft, nontender, bowel sounds present, colostomy in place  Extremities: Trace peripheral edema.  Neurologic: Awake, alert, following commands  Skin: No lesions  Access: none    Basic Metabolic Panel:  Recent Labs Lab 04/29/17 1514 04/29/17 2138 04/29/17 2341 04/30/17 0356 05/01/17 0316 05/02/17 0305 05/03/17 0450  NA 131*  --  138 137 132* 136 138  K 7.4*  --  3.3* 3.9 4.5 4.7 4.6  CL 110  --  105 106 108 113* 114*  CO2 11*  --  23 22 17* 21* 19*  GLUCOSE 106*  --  80 83 85 113* 89  BUN 91*  --  46* 50* 61* 38* 25*  CREATININE 5.37*  --  2.78* 3.19* 2.66* 1.59* 1.20*  CALCIUM 8.9  --  8.6* 8.8* 8.1* 8.2* 9.0  MG 2.7*  --   --   --   --   --   --   PHOS  --  9.2*  --   --   --   --   --     Liver Function Tests: No results for input(s): AST, ALT, ALKPHOS, BILITOT, PROT, ALBUMIN in the last 168 hours. No results for input(s): LIPASE, AMYLASE  in the last 168 hours. No results for input(s): AMMONIA in the last 168 hours.  CBC:  Recent Labs Lab 04/29/17 1228 04/30/17 0356 05/01/17 0316 05/01/17 1604 05/02/17 0305 05/03/17 0450  WBC 9.3 7.7 5.6 4.7 5.5 6.3  NEUTROABS 6.6*  --   --   --   --   --   HGB 14.2 12.4 11.0* 11.0* 10.9* 11.5*  HCT 42.1 34.3* 30.5* 30.9* 31.3* 33.0*  MCV 97.6 95.4 96.9 95.9 96.5 96.6  PLT 237 178 146* 144* 135* 138*    Cardiac Enzymes: No results for input(s): CKTOTAL, CKMB, CKMBINDEX, TROPONINI in the last 168 hours.  BNP: Invalid input(s): POCBNP  CBG:  Recent Labs Lab 04/29/17 2130  GLUCAP 18    Microbiology: Results for orders placed or performed during the hospital encounter of 04/29/17  MRSA PCR Screening     Status: None   Collection Time: 04/30/17 12:16 AM  Result Value Ref Range Status   MRSA by PCR NEGATIVE NEGATIVE Final    Comment:        The GeneXpert MRSA Assay (FDA approved for NASAL specimens only), is one component of a comprehensive MRSA colonization surveillance program. It  is not intended to diagnose MRSA infection nor to guide or monitor treatment for MRSA infections.     Coagulation Studies: No results for input(s): LABPROT, INR in the last 72 hours.  Urinalysis:  Recent Labs  05/01/17 1311  COLORURINE YELLOW*  LABSPEC 1.012  PHURINE 5.0  GLUCOSEU NEGATIVE  HGBUR SMALL*  BILIRUBINUR NEGATIVE  KETONESUR NEGATIVE  PROTEINUR NEGATIVE  NITRITE NEGATIVE  LEUKOCYTESUR MODERATE*      Imaging: No results found.   Medications:   . sodium chloride    . sodium chloride    . heparin 950 Units/hr (05/02/17 1622)   . benzonatate  100 mg Oral TID  . fluticasone  1 spray Each Nare BID  . nicotine  14 mg Transdermal Daily   sodium chloride, sodium chloride, acetaminophen **OR** acetaminophen, albuterol, alteplase, heparin, lidocaine (PF), lidocaine-prilocaine, menthol-cetylpyridinium, ondansetron **OR** ondansetron (ZOFRAN) IV,  pentafluoroprop-tetrafluoroeth  Assessment/ Plan:  66 y.o. female with a PMHx of asthma, coronary artery disease s/p CABG, epistaxis, COPD, hyperlipidemia, hypertension, peripheral vascular disease status post aortobifemoral bypass, who was admitted to [facility] on 04/29/2017 for evaluation and management of acute renal failure and severe hyperkalemia.   1. Acute renal failure/chronic kidney disease stage III Baseline creatinine 1.7.  Cause of acute renal failure currently unclear. Patient was on Bactrim 1-2 doses. In addition she's had diminished by mouth intake per her report. Renal ultrasound negative for hydronephrosis. - Patient required 1 dialysis treatment when she first came in. Creatinine continues to trend down and is currently down to 1.2. Urine output was 1.4 L over the preceding 24 hours. No indication for any further dialysis. Advised the patient to maintain adequate fluid hydration status.  2. Hyperkalemia. Hyperkalemia resolved at this time. Most recent serum potassium was 4.6.  3. Metabolic acidosis. Serum bicarbonate slightly lower at 19 this a.m. Continue to monitor.  4. Hypotension.  Now resolved.  Patient now off of IV fluids.   LOS: 4 Carly Khan 7/1/20189:05 AM

## 2017-05-03 NOTE — Progress Notes (Signed)
[place] for Heparin Indication: atrial fibrillation/mural thrombus  Allergies  Allergen Reactions  . Penicillin G Hives and Itching    Patient Measurements: Height: 5\' 4"  (162.6 cm) Weight: 147 lb 11.3 oz (67 kg) IBW/kg (Calculated) : 54.7 Heparin Dosing Weight: 66 kg  Vital Signs: Temp: 98.4 F (36.9 C) (07/01 0356) Temp Source: Oral (07/01 0356) BP: 128/61 (07/01 0356) Pulse Rate: 66 (07/01 0356)  Labs:  Recent Labs  05/01/17 0316 05/01/17 1604 05/01/17 2236 05/02/17 0305 05/03/17 0450  HGB 11.0* 11.0*  --  10.9* 11.5*  HCT 30.5* 30.9*  --  31.3* 33.0*  PLT 146* 144*  --  135* 138*  APTT 29  --  104* 107*  --   HEPARINUNFRC <0.10*  --   --  0.49 0.55  CREATININE 2.66*  --   --  1.59* 1.20*    Estimated Creatinine Clearance: 42.8 mL/min (A) (by C-G formula based on SCr of 1.2 mg/dL (H)).   Medical History: Past Medical History:  Diagnosis Date  . CAD in native artery 02/28/2017  . Cancer of left lung ([place]) 2016   LUL lung resection  . CKD (chronic kidney disease) stage 3, GFR 30-59 ml/min 04/09/2017  . 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 Essential hypertension 04/09/2017  . History of sebaceous cyst 02/28/2017  . Intermittent vertigo 04/09/2017  . Itching 02/28/2017  . 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 Heparin held this am secondary to bleeding from catheter site. Heparin resumed at 950 units/hr.   Goal of Therapy:  Heparin level 0.3-0.7 units/ml  APTT 68-109 Monitor platelets by anticoagulation protocol: Yes   Plan:  APTT is in range. Continue heparin 950 units/hr. Anti-Xa level has likely normalized. Will obtain aPTT and anti-XA level with am labs.   6/30 AM aPTT 107, heparin level 0.49. Both in therapeutic range. Continue current regimen. Will continue to follow by  heparin level only. Heparin level and CBC ordered with tomorrow AM labs.  7/1 AM heparin level 0.55. Continue current regimen. Recheck heparin level and CBC with tomorrow AM labs.  Sim Boast, PharmD, BCPS  05/03/17 6:05 AM

## 2017-05-04 ENCOUNTER — Inpatient Hospital Stay (HOSPITAL_COMMUNITY)
Admit: 2017-05-04 | Discharge: 2017-05-04 | Disposition: A | Discharge status: Home / Self Care | Payer: Medicare Other | Attending: Cardiovascular Disease | Admitting: Cardiovascular Disease

## 2017-05-04 ENCOUNTER — Telehealth: Payer: Self-pay | Admitting: *Deleted

## 2017-05-04 ENCOUNTER — Encounter
Admission: EM | Disposition: A | Discharge status: Home / Self Care | Payer: Self-pay | Source: Home / Self Care | Attending: Internal Medicine

## 2017-05-04 DIAGNOSIS — I2510 Atherosclerotic heart disease of native coronary artery without angina pectoris: Secondary | ICD-10-CM

## 2017-05-04 HISTORY — PX: TEE WITHOUT CARDIOVERSION: SHX5443

## 2017-05-04 LAB — BASIC METABOLIC PANEL
Anion gap: 3 — ABNORMAL LOW (ref 5–15)
BUN: 25 mg/dL — ABNORMAL HIGH (ref 6–20)
CHLORIDE: 110 mmol/L (ref 101–111)
CO2: 22 mmol/L (ref 22–32)
CREATININE: 1.05 mg/dL — AB (ref 0.44–1.00)
Calcium: 8.8 mg/dL — ABNORMAL LOW (ref 8.9–10.3)
GFR calc non Af Amer: 54 mL/min — ABNORMAL LOW (ref >60)
Glucose, Bld: 92 mg/dL (ref 65–99)
POTASSIUM: 4.5 mmol/L (ref 3.5–5.1)
SODIUM: 135 mmol/L (ref 135–145)

## 2017-05-04 LAB — CBC
HEMATOCRIT: 32 % — AB (ref 35.0–47.0)
HEMOGLOBIN: 11.3 g/dL — AB (ref 12.0–16.0)
MCH: 33.7 pg (ref 26.0–34.0)
MCHC: 35.4 g/dL (ref 32.0–36.0)
MCV: 95.1 fL (ref 80.0–100.0)
Platelets: 138 10*3/uL — ABNORMAL LOW (ref 150–440)
RBC: 3.37 MIL/uL — AB (ref 3.80–5.20)
RDW: 12 % (ref 11.5–14.5)
WBC: 6.1 10*3/uL (ref 3.6–11.0)

## 2017-05-04 LAB — HEPARIN LEVEL (UNFRACTIONATED): HEPARIN UNFRACTIONATED: 3.32 [IU]/mL — AB (ref 0.30–0.70)

## 2017-05-04 SURGERY — ECHOCARDIOGRAM, TRANSESOPHAGEAL
Anesthesia: Moderate Sedation

## 2017-05-04 MED ORDER — NICOTINE 14 MG/24HR TD PT24: 14.0000 mg | MEDICATED_PATCH | Freq: Every day | TRANSDERMAL | 0 refills | Status: DC

## 2017-05-04 MED ORDER — MIDAZOLAM HCL 2 MG/2ML IJ SOLN
INTRAMUSCULAR | Status: AC | PRN
  Administered 2017-05-04: 2 mg via INTRAVENOUS

## 2017-05-04 MED ORDER — FENTANYL CITRATE (PF) 100 MCG/2ML IJ SOLN
INTRAMUSCULAR | Status: AC
  Filled 2017-05-04: qty 2

## 2017-05-04 MED ORDER — MIDAZOLAM HCL 5 MG/5ML IJ SOLN
INTRAMUSCULAR | Status: AC
  Filled 2017-05-04: qty 5

## 2017-05-04 MED ORDER — LIDOCAINE VISCOUS 2 % MT SOLN
OROMUCOSAL | Status: DC
Start: 2017-05-04 — End: 2017-05-04
  Filled 2017-05-04: qty 15

## 2017-05-04 MED ORDER — FENTANYL CITRATE (PF) 100 MCG/2ML IJ SOLN
INTRAMUSCULAR | Status: AC | PRN
  Administered 2017-05-04: 25 ug via INTRAVENOUS

## 2017-05-04 MED ORDER — BUTAMBEN-TETRACAINE-BENZOCAINE 2-2-14 % EX AERO
INHALATION_SPRAY | CUTANEOUS | Status: AC
  Filled 2017-05-04: qty 20

## 2017-05-04 MED ORDER — SODIUM CHLORIDE 0.9 % IV SOLN
INTRAVENOUS | Status: DC
  Administered 2017-05-04: 08:00:00 via INTRAVENOUS

## 2017-05-04 MED ORDER — BENZONATATE 100 MG PO CAPS: 100.0000 mg | ORAL_CAPSULE | Freq: Three times a day (TID) | ORAL | 0 refills | Status: DC

## 2017-05-04 NOTE — Progress Notes (Signed)
Per Dr. Posey Pronto do not give eliquis this AM as TEE showed no clot.

## 2017-05-04 NOTE — Telephone Encounter (Signed)
Patient contacted regarding discharge from [facility] on 05/04/17.  Patient understands to follow up with provider Ignacia Bayley NP on 06/03/17 at 08:00AM at [facility]. Patient understands discharge instructions? Yes Patient understands medications and regiment? Yes Patient understands to bring all medications to this visit? Yes  Spoke with patient and she states that she is on her way home right now. She states that both her and daughter understood her discharge instructions and medications with no further questions at this time.

## 2017-05-04 NOTE — Progress Notes (Signed)
*  PRELIMINARY RESULTS* Echocardiogram Echocardiogram Transesophageal has been performed.  Sherrie Sport 05/04/2017, 9:43 AM

## 2017-05-04 NOTE — Discharge Instructions (Signed)
[place] at [place]:  Cardiac diet  DISCHARGE CONDITION:  Stable  ACTIVITY:  Activity as tolerated  OXYGEN:  Home Oxygen: No.   Oxygen Delivery: room air  DISCHARGE LOCATION:  home    ADDITIONAL DISCHARGE INSTRUCTION: stop smoking   If you experience worsening of your admission symptoms, develop shortness of breath, life threatening emergency, suicidal or homicidal thoughts you must seek medical attention immediately by calling 911 or calling your MD immediately  if symptoms less severe.  You Must read complete instructions/literature along with all the possible adverse reactions/side effects for all the Medicines you take and that have been prescribed to you. Take any new Medicines after you have completely understood and accpet all the possible adverse reactions/side effects.   Please note  You were cared for by a hospitalist during your hospital stay. If you have any questions about your discharge medications or the care you received while you were in the hospital after you are discharged, you can call the unit and asked to speak with the hospitalist on call if the hospitalist that took care of you is not available. Once you are discharged, your primary care physician will handle any further medical issues. Please note that NO REFILLS for any discharge medications will be authorized once you are discharged, as it is imperative that you return to your primary care physician (or establish a relationship with a primary care physician if you do not have one) for your aftercare needs so that they can reassess your need for medications and monitor your lab values.

## 2017-05-04 NOTE — Progress Notes (Signed)
Pt A and O x 4. VSS. Pt tolerating diet well. No complaints of pain or nausea. IV removed intact, prescriptions given. Pt and daughter voiced understanding of discharge instructions with no further questions. Colostomy bag changed. Verified that pt is to be discharged on aspirin and eliquis. Pt discharged via wheelchair with nurse.

## 2017-05-04 NOTE — Progress Notes (Signed)
Progress Note  Patient Name: Carly Khan Date of Encounter: 05/04/2017  Primary Cardiologist: Dr. Saunders Khan.   Subjective    no chest pain or shortness of breath.The patient underwent a TEE today which showed no evidence of left atrial thrombus. Renal function continues to improve.  Inpatient Medications    Scheduled Meds: . apixaban  5 mg Oral BID  . benzonatate  100 mg Oral TID  . butamben-tetracaine-benzocaine      . fentaNYL      . fluticasone  1 spray Each Nare BID  . lidocaine      . midazolam      . nicotine  14 mg Transdermal Daily   Continuous Infusions: . sodium chloride    . sodium chloride    . sodium chloride 50 mL/hr at 05/04/17 0806   PRN Meds: sodium chloride, sodium chloride, acetaminophen **OR** acetaminophen, albuterol, alteplase, heparin, lidocaine (PF), lidocaine-prilocaine, menthol-cetylpyridinium, ondansetron **OR** ondansetron (ZOFRAN) IV, pentafluoroprop-tetrafluoroeth   Vital Signs    Vitals:   05/04/17 0945 05/04/17 1000 05/04/17 1023 05/04/17 1220  BP: 136/83 127/69 (!) 136/56 (!) 143/78  Pulse: 79 74 70 70  Resp: 15 20 20 18   Temp:   97.7 F (36.5 C) 98.2 F (36.8 C)  TempSrc:   Oral Oral  SpO2: 98% 97% 100% 100%  Weight:      Height:        Intake/Output Summary (Last 24 hours) at 05/04/17 1250 Last data filed at 05/04/17 1242  Gross per 24 hour  Intake              240 ml  Output             1200 ml  Net             -960 ml   Filed Weights   04/29/17 1511 04/29/17 2110  Weight: 146 lb (66.2 kg) 147 lb 11.3 oz (67 kg)    Telemetry    NSR - Personally Reviewed  ECG    Not done - Personally Reviewed  Physical Exam   GEN: No acute distress.   Neck: No JVD Cardiac: RRR, no murmurs, rubs, or gallops.  Respiratory: Clear to auscultation bilaterally. GI: Soft, nontender, non-distended  MS: No edema; No deformity. Neuro:  Nonfocal  Psych: Normal affect   Labs    Chemistry Recent Labs Lab 05/02/17 0305 05/03/17 0450  05/04/17 0339  NA 136 138 135  K 4.7 4.6 4.5  CL 113* 114* 110  CO2 21* 19* 22  GLUCOSE 113* 89 92  BUN 38* 25* 25*  CREATININE 1.59* 1.20* 1.05*  CALCIUM 8.2* 9.0 8.8*  GFRNONAA 33* 46* 54*  GFRAA 38* 53* >60  ANIONGAP 2* 5 3*     Hematology Recent Labs Lab 05/02/17 0305 05/03/17 0450 05/04/17 0339  WBC 5.5 6.3 6.1  RBC 3.25* 3.41* 3.37*  HGB 10.9* 11.5* 11.3*  HCT 31.3* 33.0* 32.0*  MCV 96.5 96.6 95.1  MCH 33.5 33.8 33.7  MCHC 34.8 35.0 35.4  RDW 12.1 12.0 12.0  PLT 135* 138* 138*    Cardiac EnzymesNo results for input(s): TROPONINI in the last 168 hours. No results for input(s): TROPIPOC in the last 168 hours.   BNPNo results for input(s): BNP, PROBNP in the last 168 hours.   DDimer No results for input(s): DDIMER in the last 168 hours.   Radiology    No results found.  Cardiac Studies   TEE today:  Study Conclusions  - Left ventricle:  Systolic function was normal. The estimated   ejection fraction was in the range of 60% to 65%. Wall motion was   normal; there were no regional wall motion abnormalities. - Left atrium: No evidence of thrombus in the atrial cavity or   appendage. - Right atrium: No evidence of thrombus in the atrial cavity or   appendage. - Atrial septum: There was increased thickness of the septum,   consistent with lipomatous hypertrophy. There was no atrial level   shunt. Echo contrast study showed no right-to-left atrial level   shunt, at baseline or with provocation.  Patient Profile     66 y.o. female  with known coronary artery disease prior CABG in 1991 and possible left atrial thrombus, the concern had been raised by CT scanning. She has no history of atrial fibrillation. Transthoracic echo was not  helpful. TEE was scheduled. Subsequently, she was admitted for hyperkalemia and acute renal failure requiring transient dialysis. Intercurrently she's been managed with a heparin drip.  Assessment & Plan    1. Possible left  atrial appendage thrombus: TEE was performed today which showed no evidence of thrombus. I still think there is a concern that the acute renal failure does not have a clear etiology and thus there is a concern if embolism into the kidneys leading to renal failure. Due to that, I recommend continuing Eliquis for now. Outpatient telemetry is recommended. She can follow-up with Dr. Saunders Khan in few weeks. Ultimately, if A. fib evaluation remains negative with no convincing evidence of thromboembolic events, anticoagulation can be discontinued.  2. Coronary artery disease involving native coronary arteries without angina: Continue medical therapy.  3. Acute renal failure: Almost completely resolved.  Signed, Carly Sacramento, MD  05/04/2017, 12:50 PM

## 2017-05-04 NOTE — Progress Notes (Signed)
Spoke with TEE staff and pt is to be NPO until 11:30. Per Dr. Posey Pronto okay to place pt back on Renal diet at this time and will administer meds at this time.

## 2017-05-04 NOTE — Telephone Encounter (Signed)
Admitted

## 2017-05-04 NOTE — Progress Notes (Signed)
[facility]  ROUNDING NOTE   Subjective:   Patient laying in bed. No complaints.   Creatinine 1.05 (1.2)  Objective:  Vital signs in last 24 hours:  Temp:  [97.7 F (36.5 C)-98.7 F (37.1 C)] 98.2 F (36.8 C) (07/02 1220) Pulse Rate:  [65-87] 70 (07/02 1220) Resp:  [14-20] 18 (07/02 1220) BP: (113-157)/(56-87) 143/78 (07/02 1220) SpO2:  [97 %-100 %] 100 % (07/02 1220)  Weight change:  Filed Weights   04/29/17 1511 04/29/17 2110  Weight: 66.2 kg (146 lb) 67 kg (147 lb 11.3 oz)    Intake/Output: I/O last 3 completed shifts: In: 670.8 [P.O.:340; I.V.:330.8] Out: 2060 [Urine:1460; Stool:600]   Intake/Output this shift:  Total I/O In: 240 [P.O.:240] Out: 75 [Stool:75]  Physical Exam: General: No acute distress  Head: Normocephalic, atraumatic. Moist oral mucosal membranes, poor dentition  Eyes: Anicteric  Neck: Supple, trachea midline  Lungs:  Clear to auscultation, normal effort  Heart: S1S2 no rubs  Abdomen:  Soft, nontender, bowel sounds present, colostomy in place  Extremities: Trace peripheral edema.  Neurologic: Awake, alert, following commands  Skin: No lesions  Access: none    Basic Metabolic Panel:  Recent Labs Lab 04/29/17 1514 04/29/17 2138  04/30/17 0356 05/01/17 0316 05/02/17 0305 05/03/17 0450 05/04/17 0339  NA 131*  --   < > 137 132* 136 138 135  K 7.4*  --   < > 3.9 4.5 4.7 4.6 4.5  CL 110  --   < > 106 108 113* 114* 110  CO2 11*  --   < > 22 17* 21* 19* 22  GLUCOSE 106*  --   < > 83 85 113* 89 92  BUN 91*  --   < > 50* 61* 38* 25* 25*  CREATININE 5.37*  --   < > 3.19* 2.66* 1.59* 1.20* 1.05*  CALCIUM 8.9  --   < > 8.8* 8.1* 8.2* 9.0 8.8*  MG 2.7*  --   --   --   --   --   --   --   PHOS  --  9.2*  --   --   --   --   --   --   < > = values in this interval not displayed.  Liver Function Tests: No results for input(s): AST, ALT, ALKPHOS, BILITOT, PROT, ALBUMIN in the last 168 hours. No results for input(s): LIPASE,  AMYLASE in the last 168 hours. No results for input(s): AMMONIA in the last 168 hours.  CBC:  Recent Labs Lab 04/29/17 1228  05/01/17 0316 05/01/17 1604 05/02/17 0305 05/03/17 0450 05/04/17 0339  WBC 9.3  < > 5.6 4.7 5.5 6.3 6.1  NEUTROABS 6.6*  --   --   --   --   --   --   HGB 14.2  < > 11.0* 11.0* 10.9* 11.5* 11.3*  HCT 42.1  < > 30.5* 30.9* 31.3* 33.0* 32.0*  MCV 97.6  < > 96.9 95.9 96.5 96.6 95.1  PLT 237  < > 146* 144* 135* 138* 138*  < > = values in this interval not displayed.  Cardiac Enzymes: No results for input(s): CKTOTAL, CKMB, CKMBINDEX, TROPONINI in the last 168 hours.  BNP: Invalid input(s): POCBNP  CBG:  Recent Labs Lab 04/29/17 2130  GLUCAP 44    Microbiology: Results for orders placed or performed during the hospital encounter of 04/29/17  MRSA PCR Screening     Status: None   Collection Time: 04/30/17  12:16 AM  Result Value Ref Range Status   MRSA by PCR NEGATIVE NEGATIVE Final    Comment:        The GeneXpert MRSA Assay (FDA approved for NASAL specimens only), is one component of a comprehensive MRSA colonization surveillance program. It is not intended to diagnose MRSA infection nor to guide or monitor treatment for MRSA infections.     Coagulation Studies: No results for input(s): LABPROT, INR in the last 72 hours.  Urinalysis: No results for input(s): COLORURINE, LABSPEC, PHURINE, GLUCOSEU, HGBUR, BILIRUBINUR, KETONESUR, PROTEINUR, UROBILINOGEN, NITRITE, LEUKOCYTESUR in the last 72 hours.  Invalid input(s): APPERANCEUR    Imaging: No results found.   Medications:   . sodium chloride    . sodium chloride    . sodium chloride 50 mL/hr at 05/04/17 0806   . apixaban  5 mg Oral BID  . benzonatate  100 mg Oral TID  . butamben-tetracaine-benzocaine      . fentaNYL      . fluticasone  1 spray Each Nare BID  . lidocaine      . midazolam      . nicotine  14 mg Transdermal Daily   sodium chloride, sodium chloride,  acetaminophen **OR** acetaminophen, albuterol, alteplase, heparin, lidocaine (PF), lidocaine-prilocaine, menthol-cetylpyridinium, ondansetron **OR** ondansetron (ZOFRAN) IV, pentafluoroprop-tetrafluoroeth  Assessment/ Plan:  66 y.o.white female with COPD/tobacco abuse/asthma, coronary artery disease status post CABG, epistaxis, hyperlipidemia, hypertension, peripheral vascular disease status post aortobifemoral bypass, who was admitted to [facility] on 04/29/2017 for evaluation and management of acute renal failure and severe hyperkalemia.   1. Acute Renal failure 2. Hyperkalemia 3. Hyponatremia 4. Metabolic acidosis 5. Chronic Kidney Disease stage III 6. Hypertension  Plan:  1. Acute renal failure with hyperkalemia, metabolic acidosis and hyponatremia on chronic kidney disease stage III Baseline creatinine 1.4 GFR of 38 on 04/02/17.  Chronic kidney disease diagnosis is questionable.  Acute renal failure from bactrim, prerenal azotemia or possible emboli from possible mural thrombus.  Required 1 treatment of hemodialysis.  Urine output is nonoliguric. No acute indication for dialysis at this time.   2. Hypertension: blood pressure at goal.  - home regimen of metoprolol  Follow up with Dr. Holley Raring on 05/13/17 at 9:40 in the [place].    LOS: [place], [place] 7/2/20184:30 PM

## 2017-05-04 NOTE — Discharge Summary (Signed)
**Note Carly-Identified via Obfuscation** [facility] [place] [facility], 66 y.o., DOB Nov 03, 1951, MRN [id number]. Admission date: 04/29/2017 Discharge Date 05/04/2017 Primary MD Glendon Axe, MD Admitting Physician Nicholes Mango, MD  Admission Diagnosis  Hyperkalemia [E87.5] AKI (acute kidney injury) ([place]) [N17.9] Acute renal failure, unspecified acute renal failure type [facility]) [N17.9]  Discharge Diagnosis   Active Problems: AKI (acute kidney injury) ([place]) on chronic kidney disease stage III etiology unclear could be related to Bactrim Hyperkalemia Acute renal failure ([place]) now resolved Metabolic acidosis Hypotension Possible cardiac thrombus however TEE is negative  History of lung cancer        Hospital Course Carly Khan  is a 66 y.o. female with a known history of Coronary artery disease disease status post quadruple CABG, chronic kidney disease stage III, history of lung cancer status post pneumonectomy on the left, chronic diverticulitis status post colostomy was recently moved from [place] to [place] and was seen by Dr. Adonis Huguenin for her colostomy reversal. She was referred to cardiology Dr. Saunders Revel for cardiac clearance. Initial lab work was abnormal and patient was sent to the ED, repeat labs in the emergency department has revealed potassium is 7.4 and sodium 131 creatinine 5.4, no EKG changes were noticed. Patient denies any chest pain or shortness of breath. Patient was started on Bactrim by Dr. Adonis Huguenin for sebaceous cyst on her back.   In terms of possible cardiac thrombus that was noted on CT scan of the chest and abdomen patient had a echo which failed to show that she underwent a TEE which failed to show any thrombus however due to possible cause of acute renal failure by a thrombosis from a cardiac source she is continued on full dose anticoagulation per cardiology.  Acute renal failure the etiology of this is unclear however patient was hypotensive she could've had ATN her  renal function is currently back to baseline.  Hypotension now resolved blood pressure is normal, Toprol was discontinued            Consults  cardiology, nephrology  Significant Tests:  See full reports for all details     Ct Chest W Contrast  Addendum Date: 04/28/2017   ADDENDUM REPORT: 04/28/2017 11:46 ADDENDUM: These results will be called to the ordering clinician or representative by the Radiologist Assistant, and communication documented in the PACS or zVision Dashboard. Electronically Signed   By: Ilona Sorrel M.D.   On: 04/28/2017 11:46   Result Date: 04/28/2017 CLINICAL DATA:  History of left upper lobectomy in 2016 for lung cancer. 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 Patient is reportedly preoperative for reversal of ileostomy. Reported history of ventral hernia. Prior hysterectomy. EXAM: CT CHEST, ABDOMEN, AND PELVIS WITH CONTRAST TECHNIQUE: Multidetector CT imaging of the chest, abdomen and pelvis was performed following the standard protocol during bolus administration of intravenous contrast. CONTRAST:  16mL ISOVUE-300 IOPAMIDOL (ISOVUE-300) INJECTION 61% COMPARISON:  None. FINDINGS: CT CHEST FINDINGS Cardiovascular: Normal heart size. There is a low attenuation 1.0 cm filling defect in the left atrial appendage (series 2/ image 27). There is lipomatous hypertrophy of the interatrial septum. No significant pericardial fluid/thickening. Left main, left anterior descending, left circumflex and right coronary atherosclerosis status post CABG. Atherosclerotic nonaneurysmal thoracic aorta. Normal caliber pulmonary arteries. No central pulmonary emboli. Mediastinum/Nodes: Subcentimeter hypodense bilateral thyroid lobe nodules. Unremarkable esophagus. No pathologically enlarged axillary, mediastinal or hilar lymph nodes. Lungs/Pleura: No pneumothorax. No pleural effusion. Status post left upper lobectomy. Moderate centrilobular  emphysema with mild diffuse bronchial wall thickening . No acute consolidative airspace disease, lung masses or significant pulmonary nodules. Musculoskeletal: No aggressive appearing focal osseous lesions. Mild thoracic spondylosis. Intact sternotomy wires. CT ABDOMEN PELVIS FINDINGS Hepatobiliary: Liver surface is diffusely finely irregular, suggesting cirrhosis. Diffuse hepatic steatosis. No liver mass. Cholecystectomy. Bile ducts are within normal post cholecystectomy limits with common bile duct diameter 8 mm. Pancreas: Normal, with no mass or duct dilation. Spleen: Normal size. No mass. Adrenals/Urinary Tract: No discrete adrenal nodules. No hydronephrosis. Exophytic 1.3 cm simple renal cyst in the medial interpolar right kidney. Additional subcentimeter hypodense renal cortical lesions scattered in both kidneys are too small to characterize and require no further follow-up. Normal bladder. Stomach/Bowel: Grossly normal stomach. Status post diverting loop ileostomy in the ventral right lower abdominal wall. Moderate parastomal hernia contains multiple right pelvic small bowel loops. No small bowel dilatation, wall thickening, pneumatosis or focal caliber transition. Oral contrast progresses to the rectum. Normal appendix. Status post subtotal sigmoid colectomy with intact appearing colorectal anastomosis. Minimal residual diverticulosis in the distal colon. Relatively collapsed large bowel with no large bowel wall thickening or pericolonic fat stranding. Vascular/Lymphatic: Atherosclerotic nonaneurysmal abdominal aorta. Patent portal, splenic, hepatic and renal veins. No pathologically enlarged lymph nodes in the abdomen or pelvis. Reproductive: Status post hysterectomy, with no abnormal findings at the vaginal cuff. No adnexal mass. Other: No pneumoperitoneum, ascites or focal fluid collection. Diastasis in the supraumbilical midline ventral abdominal wall. Musculoskeletal: No aggressive appearing focal  osseous lesions. Marked lumbar spondylosis. IMPRESSION: 1. Low-attenuation 1.0 cm filling defect in the left atrial appendage, suggestive of thrombus. Echocardiographic correlation advised. 2. No evidence of local tumor recurrence in the left lung status post left upper lobectomy. 3. No findings of metastatic disease in the chest, abdomen or pelvis. 4. 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 No evidence of bowel obstruction or ischemia. 5. Intact appearing colorectal anastomosis status post subtotal distal colectomy. Minimal residual uncomplicated diverticulosis in the distal colon. 6. Suspected cirrhosis. Consider hepatic elastography for further liver fibrosis risk stratification, as clinically warranted. Aortic Atherosclerosis (ICD10-I70.0) and Emphysema (ICD10-J43.9). Electronically Signed: By: Ilona Sorrel M.D. On: 04/28/2017 11:40   Ct Abdomen Pelvis W Contrast  Addendum Date: 04/28/2017   ADDENDUM REPORT: 04/28/2017 11:46 ADDENDUM: These results will be called to the ordering clinician or representative by the Radiologist Assistant, and communication documented in the PACS or zVision Dashboard. Electronically Signed   By: Ilona Sorrel M.D.   On: 04/28/2017 11:46   Result Date: 04/28/2017 CLINICAL DATA:  History of left upper lobectomy in 2016 for lung cancer. 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 Patient is reportedly preoperative for reversal of ileostomy. Reported history of ventral hernia. Prior hysterectomy. EXAM: CT CHEST, ABDOMEN, AND PELVIS WITH CONTRAST TECHNIQUE: Multidetector CT imaging of the chest, abdomen and pelvis was performed following the standard protocol during bolus administration of intravenous contrast. CONTRAST:  10mL ISOVUE-300 IOPAMIDOL (ISOVUE-300) INJECTION 61% COMPARISON:  None. FINDINGS: CT CHEST FINDINGS  Cardiovascular: Normal heart size. There is a low attenuation 1.0 cm filling defect in the left atrial appendage (series 2/ image 27). There is lipomatous hypertrophy of the interatrial septum. No significant pericardial fluid/thickening. Left main, left anterior descending, left circumflex and right coronary atherosclerosis status post CABG. Atherosclerotic nonaneurysmal thoracic aorta. Normal caliber pulmonary arteries. No central pulmonary emboli. Mediastinum/Nodes: Subcentimeter hypodense bilateral thyroid lobe nodules. Unremarkable  esophagus. No pathologically enlarged axillary, mediastinal or hilar lymph nodes. Lungs/Pleura: No pneumothorax. No pleural effusion. Status post left upper lobectomy. Moderate centrilobular emphysema with mild diffuse bronchial wall thickening . No acute consolidative airspace disease, lung masses or significant pulmonary nodules. Musculoskeletal: No aggressive appearing focal osseous lesions. Mild thoracic spondylosis. Intact sternotomy wires. CT ABDOMEN PELVIS FINDINGS Hepatobiliary: Liver surface is diffusely finely irregular, suggesting cirrhosis. Diffuse hepatic steatosis. No liver mass. Cholecystectomy. Bile ducts are within normal post cholecystectomy limits with common bile duct diameter 8 mm. Pancreas: Normal, with no mass or duct dilation. Spleen: Normal size. No mass. Adrenals/Urinary Tract: No discrete adrenal nodules. No hydronephrosis. Exophytic 1.3 cm simple renal cyst in the medial interpolar right kidney. Additional subcentimeter hypodense renal cortical lesions scattered in both kidneys are too small to characterize and require no further follow-up. Normal bladder. Stomach/Bowel: Grossly normal stomach. Status post diverting loop ileostomy in the ventral right lower abdominal wall. Moderate parastomal hernia contains multiple right pelvic small bowel loops. No small bowel dilatation, wall thickening, pneumatosis or focal caliber transition. Oral contrast progresses  to the rectum. Normal appendix. Status post subtotal sigmoid colectomy with intact appearing colorectal anastomosis. Minimal residual diverticulosis in the distal colon. Relatively collapsed large bowel with no large bowel wall thickening or pericolonic fat stranding. Vascular/Lymphatic: Atherosclerotic nonaneurysmal abdominal aorta. Patent portal, splenic, hepatic and renal veins. No pathologically enlarged lymph nodes in the abdomen or pelvis. Reproductive: Status post hysterectomy, with no abnormal findings at the vaginal cuff. No adnexal mass. Other: No pneumoperitoneum, ascites or focal fluid collection. Diastasis in the supraumbilical midline ventral abdominal wall. Musculoskeletal: No aggressive appearing focal osseous lesions. Marked lumbar spondylosis. IMPRESSION: 1. Low-attenuation 1.0 cm filling defect in the left atrial appendage, suggestive of thrombus. Echocardiographic correlation advised. 2. No evidence of local tumor recurrence in the left lung status post left upper lobectomy. 3. No findings of metastatic disease in the chest, abdomen or pelvis. 4. 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 No evidence of bowel obstruction or ischemia. 5. Intact appearing colorectal anastomosis status post subtotal distal colectomy. Minimal residual uncomplicated diverticulosis in the distal colon. 6. Suspected cirrhosis. Consider hepatic elastography for further liver fibrosis risk stratification, as clinically warranted. Aortic Atherosclerosis (ICD10-I70.0) and Emphysema (ICD10-J43.9). Electronically Signed: By: Ilona Sorrel M.D. On: 04/28/2017 11:40   US Renal  Result Date: 04/30/2017 CLINICAL DATA:  Acute renal insufficiency EXAM: RENAL / URINARY TRACT ULTRASOUND COMPLETE COMPARISON:  CT abdomen and pelvis of 04/28/2017 FINDINGS: Right Kidney: Length: 10.1 cm. No hydronephrosis is seen. The echogenicity of  the parenchyma of the kidney is unremarkable. Left Kidney: Length: 9.2 cm.  No hydronephrosis is seen. Bladder: The urinary bladder is unremarkable and moderately distended. Bilateral ureteral jets are present IMPRESSION: 1. No hydronephrosis. 2. Renal parenchymal echogenicity is within normal limits. 3. Bilateral ureteral jets are present. Electronically Signed   By: Ivar Drape M.D.   On: 04/30/2017 11:04   US Renal  Result Date: 04/16/2017 CLINICAL DATA:  Stage III chronic renal disease EXAM: RENAL / URINARY TRACT ULTRASOUND COMPLETE COMPARISON:  None. FINDINGS: Right Kidney: Length: 8.7 cm. Echogenicity and renal cortical thickness are within normal limits. No mass, perinephric fluid, or hydronephrosis visualized. No sonographically demonstrable calculus or ureterectasis. Left Kidney: Length: 9.6 cm. Echogenicity and renal cortical thickness are within normal limits. No mass, perinephric fluid, or hydronephrosis visualized. No sonographically demonstrable calculus or ureterectasis. Bladder: There is echogenic  debris within the urinary bladder. Otherwise appears normal for degree of bladder distention. IMPRESSION: Kidneys are rather small, particularly on the right. This is a finding that may be seen with medical renal disease. The renal echogenicity and cortical thickness bilaterally are normal, however. No obstructing focus on either side. Echogenic debris within the bladder. Question cystitis. Hemorrhage is a less likely differential consideration. Urinary bladder wall has normal thickness. Electronically Signed   By: Lowella Grip III M.D.   On: 04/16/2017 11:29       Today   Subjective:   Carly Khan  patient feeling better denies any complaints was to go home  Objective:   Blood pressure (!) 143/78, pulse 70, temperature 98.2 F (36.8 C), temperature source Oral, resp. rate 18, height 5\' 4"  (1.626 m), weight 147 lb 11.3 oz (67 kg), SpO2 100 %.  .  Intake/Output Summary (Last 24 hours)  at 05/04/17 1511 Last data filed at 05/04/17 1300  Gross per 24 hour  Intake              480 ml  Output             1200 ml  Net             -720 ml    Exam VITAL SIGNS: Blood pressure (!) 143/78, pulse 70, temperature 98.2 F (36.8 C), temperature source Oral, resp. rate 18, height 5\' 4"  (1.626 m), weight 147 lb 11.3 oz (67 kg), SpO2 100 %.  GENERAL:  66 y.o.-year-old patient lying in the bed with no acute distress.  EYES: Pupils equal, round, reactive to light and accommodation. No scleral icterus. Extraocular muscles intact.  HEENT: Head atraumatic, normocephalic. Oropharynx and nasopharynx clear.  NECK:  Supple, no jugular venous distention. No thyroid enlargement, no tenderness.  LUNGS: Normal breath sounds bilaterally, no wheezing, rales,rhonchi or crepitation. No use of accessory muscles of respiration.  CARDIOVASCULAR: S1, S2 normal. No murmurs, rubs, or gallops.  ABDOMEN: Soft, nontender, nondistended. Bowel sounds present. No organomegaly or mass.  EXTREMITIES: No pedal edema, cyanosis, or clubbing.  NEUROLOGIC: Cranial nerves II through XII are intact. Muscle strength 5/5 in all extremities. Sensation intact. Gait not checked.  PSYCHIATRIC: The patient is alert and oriented x 3.  SKIN: No obvious rash, lesion, or ulcer.   Data Review     CBC w Diff: Lab Results  Component Value Date   WBC 6.1 05/04/2017   HGB 11.3 (L) 05/04/2017   HCT 32.0 (L) 05/04/2017   PLT 138 (L) 05/04/2017   LYMPHOPCT 20 04/29/2017   MONOPCT 6 04/29/2017   EOSPCT 1 04/29/2017   BASOPCT 1 04/29/2017   CMP: Lab Results  Component Value Date   NA 135 05/04/2017   K 4.5 05/04/2017   CL 110 05/04/2017   CO2 22 05/04/2017   BUN 25 (H) 05/04/2017   CREATININE 1.05 (H) 05/04/2017  .  Micro Results Recent Results (from the past 240 hour(s))  MRSA PCR Screening     Status: None   Collection Time: 04/30/17 12:16 AM  Result Value Ref Range Status   MRSA by PCR NEGATIVE NEGATIVE Final     Comment:        The GeneXpert MRSA Assay (FDA approved for NASAL specimens only), is one component of a comprehensive MRSA colonization surveillance program. It is not intended to diagnose MRSA infection nor to guide or monitor treatment for MRSA infections.         Code Status Orders  Start     Ordered   04/29/17 2207  Full code  Continuous     04/29/17 2207    Code Status History    Date Active Date Inactive Code Status Order ID Comments User Context   This patient has a current code status but no historical code status.          Follow-up Information    Glendon Axe, MD. Go on 05/12/2017.   Specialty:  Internal Medicine Why:  Tuesday at 11:00am for hospital follow-up Contact information: [place] [place] [address] [phone]        [name], MD. Go on 06/03/2017.   Specialty:  Cardiology Why:  Wednesday at 8:00am for cardiac and hospital follow-up Contact information: [address] [place] [address] [phone]           Discharge Medications   Allergies as of 05/04/2017      Reactions   Penicillin G Hives, Itching      Medication List    STOP taking these medications   metoprolol succinate 25 MG 24 hr tablet Commonly known as:  TOPROL-XL   sulfamethoxazole-trimethoprim 800-160 MG tablet Commonly known as:  BACTRIM DS,SEPTRA DS     TAKE these medications   albuterol 108 (90 Base) MCG/ACT inhaler Commonly known as:  PROVENTIL HFA;VENTOLIN HFA Inhale 2 puffs into the lungs every 6 (six) hours as needed.   apixaban 5 MG Tabs tablet Commonly known as:  ELIQUIS Take 1 tablet (5 mg total) by mouth 2 (two) times daily.   aspirin EC 81 MG tablet Take 1 tablet by mouth daily.   benzonatate 100 MG capsule Commonly known as:  TESSALON Take 1 capsule (100 mg total) by mouth 3 (three) times daily.   meclizine 25 MG tablet Commonly known as:  ANTIVERT Take 25 mg by  mouth 3 (three) times daily as needed for dizziness.   nicotine 14 mg/24hr patch Commonly known as:  NICODERM CQ - dosed in mg/24 hours Place 1 patch (14 mg total) onto the skin daily.   pravastatin 10 MG tablet Commonly known as:  PRAVACHOL Take 10 mg by mouth at bedtime.          Total Time in preparing paper work, data evaluation and todays exam - 35 minutes  Dustin Flock M.D on 05/04/2017 at 3:11 PM  [facility]   Office  [phone]

## 2017-05-04 NOTE — Care Management (Signed)
Patient admitted with AKI, which is now resolved.  Patient received 1 acute HD session while inpatient.  Patient was prescribed Eliquis prior to admission. No RNCM needs identified.

## 2017-05-04 NOTE — Progress Notes (Signed)
Per Dr. Posey Pronto spoke with nephrology and go ahead and give eliquis.

## 2017-05-04 NOTE — Telephone Encounter (Signed)
-----   Message from Blain Pais sent at 05/04/2017  1:17 PM EDT ----- Regarding: tcm/ph 8/1/ 8am Murray Hodgkins, NP

## 2017-05-04 NOTE — Care Management Important Message (Signed)
Important Message  Patient Details  Name: Carly Khan MRN: [id number] Date of Birth: Feb 02, 1951   Medicare Important Message Given:  Yes    Beverly Sessions, RN 05/04/2017, 11:30 AM

## 2017-05-05 ENCOUNTER — Encounter: Payer: Self-pay | Admitting: Cardiovascular Disease

## 2017-05-07 ENCOUNTER — Telehealth: Payer: Self-pay

## 2017-05-07 ENCOUNTER — Ambulatory Visit (INDEPENDENT_AMBULATORY_CARE_PROVIDER_SITE_OTHER): Payer: Medicare Other

## 2017-05-07 ENCOUNTER — Ambulatory Visit: Payer: Medicare Other | Admitting: General Surgery

## 2017-05-07 DIAGNOSIS — R55 Syncope and collapse: Secondary | ICD-10-CM | POA: Diagnosis not present

## 2017-05-07 NOTE — Telephone Encounter (Signed)
Patient notified of follow up appointment with Dr.Woodham 06/11/17.

## 2017-05-12 ENCOUNTER — Telehealth: Payer: Self-pay | Admitting: *Deleted

## 2017-05-12 NOTE — Telephone Encounter (Signed)
Requesting surgical clearance:   1. Type of surgery: Ileostomy reversal, Incisional hernia repair  2. Surgeon: Dr Clayburn Pert  3. Surgical date: TBD  4. Medications to be held: Eliquis/aspirin     5. I will defer to: Dr Saunders Revel   [facility] Attn Freda Munro [phone] (fax) [phone] (phone)

## 2017-05-12 NOTE — Telephone Encounter (Signed)
Patient is NOT cleared for any elective surgery at this time, given recent suspicion for left atrial thrombus and ongoing anticoagulation. We will readdress this at her f/u next month.  [name], MD [facility] Pager: [phone]

## 2017-05-12 NOTE — Telephone Encounter (Signed)
Encounter routed to [facility].

## 2017-05-14 ENCOUNTER — Inpatient Hospital Stay: Payer: Medicare Other | Attending: Oncology | Admitting: Oncology

## 2017-05-14 ENCOUNTER — Telehealth: Payer: Self-pay

## 2017-05-14 ENCOUNTER — Encounter: Payer: Self-pay | Admitting: Oncology

## 2017-05-14 VITALS — BP 160/80 | HR 69 | Temp 97.8°F | Resp 20 | Ht 63.78 in | Wt 150.4 lb

## 2017-05-14 DIAGNOSIS — E875 Hyperkalemia: Secondary | ICD-10-CM

## 2017-05-14 DIAGNOSIS — F17210 Nicotine dependence, cigarettes, uncomplicated: Secondary | ICD-10-CM

## 2017-05-14 DIAGNOSIS — Z932 Ileostomy status: Secondary | ICD-10-CM | POA: Diagnosis not present

## 2017-05-14 DIAGNOSIS — Z9049 Acquired absence of other specified parts of digestive tract: Secondary | ICD-10-CM

## 2017-05-14 DIAGNOSIS — K5730 Diverticulosis of large intestine without perforation or abscess without bleeding: Secondary | ICD-10-CM | POA: Diagnosis not present

## 2017-05-14 DIAGNOSIS — J439 Emphysema, unspecified: Secondary | ICD-10-CM

## 2017-05-14 DIAGNOSIS — K435 Parastomal hernia without obstruction or  gangrene: Secondary | ICD-10-CM | POA: Diagnosis not present

## 2017-05-14 DIAGNOSIS — I129 Hypertensive chronic kidney disease with stage 1 through stage 4 chronic kidney disease, or unspecified chronic kidney disease: Secondary | ICD-10-CM

## 2017-05-14 DIAGNOSIS — Z8049 Family history of malignant neoplasm of other genital organs: Secondary | ICD-10-CM | POA: Diagnosis not present

## 2017-05-14 DIAGNOSIS — Z7982 Long term (current) use of aspirin: Secondary | ICD-10-CM | POA: Diagnosis not present

## 2017-05-14 DIAGNOSIS — Z951 Presence of aortocoronary bypass graft: Secondary | ICD-10-CM | POA: Diagnosis not present

## 2017-05-14 DIAGNOSIS — Z902 Acquired absence of lung [part of]: Secondary | ICD-10-CM | POA: Diagnosis not present

## 2017-05-14 DIAGNOSIS — Z880 Allergy status to penicillin: Secondary | ICD-10-CM | POA: Diagnosis not present

## 2017-05-14 DIAGNOSIS — C3412 Malignant neoplasm of upper lobe, left bronchus or lung: Secondary | ICD-10-CM | POA: Diagnosis not present

## 2017-05-14 DIAGNOSIS — C3492 Malignant neoplasm of unspecified part of left bronchus or lung: Secondary | ICD-10-CM

## 2017-05-14 DIAGNOSIS — I2510 Atherosclerotic heart disease of native coronary artery without angina pectoris: Secondary | ICD-10-CM

## 2017-05-14 DIAGNOSIS — N183 Chronic kidney disease, stage 3 (moderate): Secondary | ICD-10-CM | POA: Diagnosis not present

## 2017-05-14 DIAGNOSIS — I700 Atherosclerosis of aorta: Secondary | ICD-10-CM | POA: Diagnosis not present

## 2017-05-14 DIAGNOSIS — Z79899 Other long term (current) drug therapy: Secondary | ICD-10-CM

## 2017-05-14 DIAGNOSIS — Z85118 Personal history of other malignant neoplasm of bronchus and lung: Secondary | ICD-10-CM | POA: Diagnosis present

## 2017-05-14 DIAGNOSIS — Z800 Family history of malignant neoplasm of digestive organs: Secondary | ICD-10-CM | POA: Diagnosis not present

## 2017-05-14 NOTE — Progress Notes (Signed)
Here for new appt evaluation. Went to hospital for labs- found to be abnormal-sent to ER -admitted to hospital here -June 26-July 2 with dx of hyperkalemia  -renal failure-had dialysis x1 . BP low and after several weeks d/c home .currently wearing 30 d holter monitor . Followed by Dr Holley Raring.

## 2017-05-14 NOTE — Telephone Encounter (Signed)
Cardiac Clearance is DENIED  from [place] due to suspicion for left atrial thrombus at this time. Will readdress at patients follow up next month.

## 2017-05-14 NOTE — Progress Notes (Signed)
Hematology/Oncology Consult note [facility] [phone] [phone]  Patient Care Team: Glendon Axe, MD as PCP - General (Internal Medicine)   Name of the patient: Carly Khan  [id number]  04/10/1951    Reason for referral- h/o stage I lung cancer   Referring physician- DR. Woodham  Date of visit: 05/14/17   History of presenting illness- 1. Patient is a 66 year old Caucasian female who had diverticulosis, by perforation/colovaginal fistula required electively and this sample repair in January 2017. She has an ileostomy in place which was supposed to be reversed while she was in [place]. She has now moved to [facility] and Dr. Adonis Huguenin plans to do the reversal procedure  2 patient also has a history of age 87 lung cancer which was diagnosed in August 2017 chest x-ray that was done as a part of the workup for colovaginal fistula showed a 4.4 x 3.0 with masslike consolidation in the left upper lobe. This was initially seen on the CT chest in November 2016 and the mass grew to 4.5 x 3.5 x 3.8 cm in June 2017. Patient had a CT-guided biopsy of the mass in August 2017 and was found to be TTF-1 positive adenocarcinoma. Predominant histology was papillary adenocarcinoma with additional micropapillary and lipid pattern bronchial and vascular margins were involved and there was no pleural invasion  3. Patient was seen by Dr Jasmine Pang who discussed questionable role for adjuvant chemotherapy given stage IA disease [id number]. She opted for watchful surveillance   4. She did have a recent CT chest on 04/28/2017 which showed:IMPRESSION: 1. Low-attenuation 1.0 cm filling defect in the left atrial appendage, suggestive of thrombus. Echocardiographic correlation advised. 2. No evidence of local tumor recurrence in the left lung status post left upper lobectomy. 3. No findings of metastatic disease in the chest, abdomen or pelvis. 4. Moderate parastomal  hernia containing multiple small bowel loops in the ventral right lower abdominal wall at the diverting loop ileostomy site. No evidence of bowel obstruction or ischemia. 5. Intact appearing colorectal anastomosis status post subtotal distal colectomy. Minimal residual uncomplicated diverticulosis in the distal colon. 6. Suspected cirrhosis. Consider hepatic elastography for further liver fibrosis risk stratification, as clinically warranted.    ECOG PS- 2  Pain scale- 0   Review of systems- ROS  Allergies  Allergen Reactions  . Penicillin G Hives and Itching    Patient Active Problem List   Diagnosis Date Noted  . Hyperkalemia   . Acute renal failure ([place])   . CKD (chronic kidney disease) stage 3, GFR 30-59 ml/min 04/09/2017  . Essential hypertension 04/09/2017  . Intermittent vertigo 04/09/2017  . CAD in native artery 02/28/2017  . 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 History of sebaceous cyst 02/28/2017  . Itching 02/28/2017  . 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 Lung nodule 06/25/2016  . 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 Primary cancer of left upper lobe of lung ([place]) 06/19/2016  . AKI (acute kidney injury) ([place]) 12/29/2015  . 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 CAD in native artery 02/28/2017  . Cancer of left lung ([place]) 2016   LUL lung resection  . CKD (chronic kidney disease) stage 3, GFR 30-59 ml/min 04/09/2017  . 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 Essential hypertension 04/09/2017  . History of sebaceous cyst 02/28/2017  . Intermittent vertigo 04/09/2017  .  Itching 02/28/2017  . S/P CABG (coronary artery bypass graft) 02/26/2017     Past Surgical History:  Procedure Laterality Date  . ABDOMINAL HYSTERECTOMY    . cardiac bypass  1990  . carpel tunnel    . CHOLECYSTECTOMY    . COLON SURGERY    . COLONOSCOPY    . CT guided  needle placement    . EXPLORATORY LAPAROTOMY    . GALLBLADDER SURGERY    . loop ileostomy    . SIGMOID RESECTION / RECTOPEXY    . TEE WITHOUT CARDIOVERSION N/A 05/04/2017   Procedure: TRANSESOPHAGEAL ECHOCARDIOGRAM (TEE);  Surgeon: Wellington Hampshire, MD;  Location: [facility] ORS;  Service: Cardiovascular;  Laterality: N/A;  . THORACOSCOPY    . vaginectomy      Social History   Social History  . Marital status: Divorced    Spouse name: N/A  . Number of children: N/A  . Years of education: N/A   Occupational History  . Not on file.   Social History Main Topics  . Smoking status: Current Every Day Smoker    Packs/day: 0.50    Years: 40.00  . Smokeless tobacco: Never Used     Comment: denied smoking info  . Alcohol use No  . Drug use: No  . Sexual activity: No   Other Topics Concern  . Not on file   Social History Narrative  . No narrative on file     Family History  Problem Relation Age of Onset  . Cancer Mother   . Stomach cancer Mother   . Heart disease Father   . Hypertension Father   . Parkinson's disease Sister   . Cervical cancer Sister      Current Outpatient Prescriptions:  .  albuterol (PROVENTIL HFA;VENTOLIN HFA) 108 (90 Base) MCG/ACT inhaler, Inhale 2 puffs into the lungs every 6 (six) hours as needed., Disp: , Rfl:  .  apixaban (ELIQUIS) 5 MG TABS tablet, Take 1 tablet (5 mg total) by mouth 2 (two) times daily., Disp: 180 tablet, Rfl: 3 .  aspirin EC 81 MG tablet, Take 1 tablet by mouth daily., Disp: , Rfl:  .  benzonatate (TESSALON) 100 MG capsule, Take 1 capsule (100 mg total) by mouth 3 (three) times daily., Disp: 20 capsule, Rfl: 0 .  metoprolol succinate (TOPROL-XL) 25 MG 24 hr tablet, Take by mouth., Disp: , Rfl:  .  pravastatin (PRAVACHOL) 10 MG tablet, Take 10 mg by mouth at bedtime., Disp: , Rfl:  .  meclizine (ANTIVERT) 25 MG tablet, Take 25 mg by mouth 3 (three) times daily as needed for dizziness., Disp: , Rfl:  .  nicotine (NICODERM CQ - DOSED  IN MG/24 HOURS) 14 mg/24hr patch, Place 1 patch (14 mg total) onto the skin daily. (Patient not taking: Reported on 05/14/2017), Disp: 28 patch, Rfl: 0   Physical exam:  Vitals:   05/14/17 1110  BP: (!) 160/80  Pulse: 69  Resp: 20  Temp: 97.8 F (36.6 C)  TempSrc: Tympanic  Weight: 150 lb 6.4 oz (68.2 kg)  Height: 5' 3.78" (1.62 m)   Physical Exam     CMP Latest Ref Rng & Units 05/04/2017  Glucose 65 - 99 mg/dL 92  BUN 6 - 20 mg/dL 25(H)  Creatinine 0.44 - 1.00 mg/dL 1.05(H)  Sodium 135 - 145 mmol/L 135  Potassium 3.5 - 5.1 mmol/L 4.5  Chloride 101 - 111 mmol/L 110  CO2 22 - 32 mmol/L 22  Calcium 8.9 - 10.3 mg/dL 8.8(L)  CBC Latest Ref Rng & Units 05/04/2017  WBC 3.6 - 11.0 K/uL 6.1  Hemoglobin 12.0 - 16.0 g/dL 11.3(L)  Hematocrit 35.0 - 47.0 % 32.0(L)  Platelets 150 - 440 K/uL 138(L)    No images are attached to the encounter.  Ct Chest W Contrast  Addendum Date: 04/28/2017   ADDENDUM REPORT: 04/28/2017 11:46 ADDENDUM: These results will be called to the ordering clinician or representative by the Radiologist Assistant, and communication documented in the PACS or zVision Dashboard. Electronically Signed   By: Ilona Sorrel M.D.   On: 04/28/2017 11:46   Result Date: 04/28/2017 CLINICAL DATA:  History of left upper lobectomy in 2016 for lung cancer. 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 Patient is reportedly preoperative for reversal of ileostomy. Reported history of ventral hernia. Prior hysterectomy. EXAM: CT CHEST, ABDOMEN, AND PELVIS WITH CONTRAST TECHNIQUE: Multidetector CT imaging of the chest, abdomen and pelvis was performed following the standard protocol during bolus administration of intravenous contrast. CONTRAST:  59mL ISOVUE-300 IOPAMIDOL (ISOVUE-300) INJECTION 61% COMPARISON:  None. FINDINGS: CT CHEST FINDINGS Cardiovascular: Normal heart size. There is a low attenuation 1.0 cm filling defect in the left atrial  appendage (series 2/ image 27). There is lipomatous hypertrophy of the interatrial septum. No significant pericardial fluid/thickening. Left main, left anterior descending, left circumflex and right coronary atherosclerosis status post CABG. Atherosclerotic nonaneurysmal thoracic aorta. Normal caliber pulmonary arteries. No central pulmonary emboli. Mediastinum/Nodes: Subcentimeter hypodense bilateral thyroid lobe nodules. Unremarkable esophagus. No pathologically enlarged axillary, mediastinal or hilar lymph nodes. Lungs/Pleura: No pneumothorax. No pleural effusion. Status post left upper lobectomy. Moderate centrilobular emphysema with mild diffuse bronchial wall thickening . No acute consolidative airspace disease, lung masses or significant pulmonary nodules. Musculoskeletal: No aggressive appearing focal osseous lesions. Mild thoracic spondylosis. Intact sternotomy wires. CT ABDOMEN PELVIS FINDINGS Hepatobiliary: Liver surface is diffusely finely irregular, suggesting cirrhosis. Diffuse hepatic steatosis. No liver mass. Cholecystectomy. Bile ducts are within normal post cholecystectomy limits with common bile duct diameter 8 mm. Pancreas: Normal, with no mass or duct dilation. Spleen: Normal size. No mass. Adrenals/Urinary Tract: No discrete adrenal nodules. No hydronephrosis. Exophytic 1.3 cm simple renal cyst in the medial interpolar right kidney. Additional subcentimeter hypodense renal cortical lesions scattered in both kidneys are too small to characterize and require no further follow-up. Normal bladder. Stomach/Bowel: Grossly normal stomach. Status post diverting loop ileostomy in the ventral right lower abdominal wall. Moderate parastomal hernia contains multiple right pelvic small bowel loops. No small bowel dilatation, wall thickening, pneumatosis or focal caliber transition. Oral contrast progresses to the rectum. Normal appendix. Status post subtotal sigmoid colectomy with intact appearing  colorectal anastomosis. Minimal residual diverticulosis in the distal colon. Relatively collapsed large bowel with no large bowel wall thickening or pericolonic fat stranding. Vascular/Lymphatic: Atherosclerotic nonaneurysmal abdominal aorta. Patent portal, splenic, hepatic and renal veins. No pathologically enlarged lymph nodes in the abdomen or pelvis. Reproductive: Status post hysterectomy, with no abnormal findings at the vaginal cuff. No adnexal mass. Other: No pneumoperitoneum, ascites or focal fluid collection. Diastasis in the supraumbilical midline ventral abdominal wall. Musculoskeletal: No aggressive appearing focal osseous lesions. Marked lumbar spondylosis. IMPRESSION: 1. Low-attenuation 1.0 cm filling defect in the left atrial appendage, suggestive of thrombus. Echocardiographic correlation advised. 2. No evidence of local tumor recurrence in the left lung status post left upper lobectomy. 3. No findings of metastatic disease in the chest, abdomen or pelvis. 4. Moderate parastomal hernia containing multiple small bowel loops in the ventral  right lower abdominal wall at the diverting loop ileostomy site. No evidence of bowel obstruction or ischemia. 5. Intact appearing colorectal anastomosis status post subtotal distal colectomy. Minimal residual uncomplicated diverticulosis in the distal colon. 6. Suspected cirrhosis. Consider hepatic elastography for further liver fibrosis risk stratification, as clinically warranted. Aortic Atherosclerosis (ICD10-I70.0) and Emphysema (ICD10-J43.9). Electronically Signed: By: Ilona Sorrel M.D. On: 04/28/2017 11:40   Ct Abdomen Pelvis W Contrast  Addendum Date: 04/28/2017   ADDENDUM REPORT: 04/28/2017 11:46 ADDENDUM: These results will be called to the ordering clinician or representative by the Radiologist Assistant, and communication documented in the PACS or zVision Dashboard. Electronically Signed   By: Ilona Sorrel M.D.   On: 04/28/2017 11:46   Result Date:  04/28/2017 CLINICAL DATA:  History of left upper lobectomy in 2016 for lung cancer. 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 Patient is reportedly preoperative for reversal of ileostomy. Reported history of ventral hernia. Prior hysterectomy. EXAM: CT CHEST, ABDOMEN, AND PELVIS WITH CONTRAST TECHNIQUE: Multidetector CT imaging of the chest, abdomen and pelvis was performed following the standard protocol during bolus administration of intravenous contrast. CONTRAST:  102mL ISOVUE-300 IOPAMIDOL (ISOVUE-300) INJECTION 61% COMPARISON:  None. FINDINGS: CT CHEST FINDINGS Cardiovascular: Normal heart size. There is a low attenuation 1.0 cm filling defect in the left atrial appendage (series 2/ image 27). There is lipomatous hypertrophy of the interatrial septum. No significant pericardial fluid/thickening. Left main, left anterior descending, left circumflex and right coronary atherosclerosis status post CABG. Atherosclerotic nonaneurysmal thoracic aorta. Normal caliber pulmonary arteries. No central pulmonary emboli. Mediastinum/Nodes: Subcentimeter hypodense bilateral thyroid lobe nodules. Unremarkable esophagus. No pathologically enlarged axillary, mediastinal or hilar lymph nodes. Lungs/Pleura: No pneumothorax. No pleural effusion. Status post left upper lobectomy. Moderate centrilobular emphysema with mild diffuse bronchial wall thickening . No acute consolidative airspace disease, lung masses or significant pulmonary nodules. Musculoskeletal: No aggressive appearing focal osseous lesions. Mild thoracic spondylosis. Intact sternotomy wires. CT ABDOMEN PELVIS FINDINGS Hepatobiliary: Liver surface is diffusely finely irregular, suggesting cirrhosis. Diffuse hepatic steatosis. No liver mass. Cholecystectomy. Bile ducts are within normal post cholecystectomy limits with common bile duct diameter 8 mm. Pancreas: Normal, with no mass or duct dilation. Spleen: Normal size.  No mass. Adrenals/Urinary Tract: No discrete adrenal nodules. No hydronephrosis. Exophytic 1.3 cm simple renal cyst in the medial interpolar right kidney. Additional subcentimeter hypodense renal cortical lesions scattered in both kidneys are too small to characterize and require no further follow-up. Normal bladder. Stomach/Bowel: Grossly normal stomach. Status post diverting loop ileostomy in the ventral right lower abdominal wall. Moderate parastomal hernia contains multiple right pelvic small bowel loops. No small bowel dilatation, wall thickening, pneumatosis or focal caliber transition. Oral contrast progresses to the rectum. Normal appendix. Status post subtotal sigmoid colectomy with intact appearing colorectal anastomosis. Minimal residual diverticulosis in the distal colon. Relatively collapsed large bowel with no large bowel wall thickening or pericolonic fat stranding. Vascular/Lymphatic: Atherosclerotic nonaneurysmal abdominal aorta. Patent portal, splenic, hepatic and renal veins. No pathologically enlarged lymph nodes in the abdomen or pelvis. Reproductive: Status post hysterectomy, with no abnormal findings at the vaginal cuff. No adnexal mass. Other: No pneumoperitoneum, ascites or focal fluid collection. Diastasis in the supraumbilical midline ventral abdominal wall. Musculoskeletal: No aggressive appearing focal osseous lesions. Marked lumbar spondylosis. IMPRESSION: 1. Low-attenuation 1.0 cm filling defect in the left atrial appendage, suggestive of thrombus. Echocardiographic correlation advised. 2. No evidence of local tumor recurrence in the left lung status post left upper lobectomy. 3.  No findings of metastatic disease in the chest, abdomen or pelvis. 4. 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 No evidence of bowel obstruction or ischemia. 5. Intact appearing colorectal anastomosis status  post subtotal distal colectomy. Minimal residual uncomplicated diverticulosis in the distal colon. 6. Suspected cirrhosis. Consider hepatic elastography for further liver fibrosis risk stratification, as clinically warranted. Aortic Atherosclerosis (ICD10-I70.0) and Emphysema (ICD10-J43.9). Electronically Signed: By: Ilona Sorrel M.D. On: 04/28/2017 11:40   US Renal  Result Date: 04/30/2017 CLINICAL DATA:  Acute renal insufficiency EXAM: RENAL / URINARY TRACT ULTRASOUND COMPLETE COMPARISON:  CT abdomen and pelvis of 04/28/2017 FINDINGS: Right Kidney: Length: 10.1 cm. No hydronephrosis is seen. The echogenicity of the parenchyma of the kidney is unremarkable. Left Kidney: Length: 9.2 cm.  No hydronephrosis is seen. Bladder: The urinary bladder is unremarkable and moderately distended. Bilateral ureteral jets are present IMPRESSION: 1. No hydronephrosis. 2. Renal parenchymal echogenicity is within normal limits. 3. Bilateral ureteral jets are present. Electronically Signed   By: Ivar Drape M.D.   On: 04/30/2017 11:04   US Renal  Result Date: 04/16/2017 CLINICAL DATA:  Stage III chronic renal disease EXAM: RENAL / URINARY TRACT ULTRASOUND COMPLETE COMPARISON:  None. FINDINGS: Right Kidney: Length: 8.7 cm. Echogenicity and renal cortical thickness are within normal limits. No mass, perinephric fluid, or hydronephrosis visualized. No sonographically demonstrable calculus or ureterectasis. Left Kidney: Length: 9.6 cm. Echogenicity and renal cortical thickness are within normal limits. No mass, perinephric fluid, or hydronephrosis visualized. No sonographically demonstrable calculus or ureterectasis. Bladder: There is echogenic debris within the urinary bladder. Otherwise appears normal for degree of bladder distention. IMPRESSION: Kidneys are rather small, particularly on the right. This is a finding that may be seen with medical renal disease. The renal echogenicity and cortical thickness bilaterally are normal,  however. No obstructing focus on either side. Echogenic debris within the bladder. Question cystitis. Hemorrhage is a less likely differential consideration. Urinary bladder wall has normal thickness. Electronically Signed   By: Lowella Grip III M.D.   On: 04/16/2017 11:29    Assessment and plan- Patient is a 66 y.o. female with prior h/o Lung cancer in August 2017 Stage IA s/p resection  Discuss results of CT thorax with the patient which does not show any evidence of lung cancer recurrence. I will repeat another CT chest with contrast in 6 months time. She will get CBC and CMP a week prior to CT. She did have evidence of acute renal failure which has now resolved.    also discussed other findings on CT scan including incidentally noted cirrhosis of the liver which needs to be further evaluated by GI. I will therefore make a GI referral in 3-4 months  From an oncology standpoint patient is okay to proceed with ileostomy reversal. Recent CBC showed mild normocytic anemia and mild thrombocytopenia with a platelet count of 138 which should not be a contraindication for surgery   Thank you for this kind referral and the opportunity to participate in the care of this patient   Visit Diagnosis 1. 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 Randa Evens, MD, MPH [facility] Pager- [phone] 05/14/2017 4:51 PM

## 2017-05-17 DIAGNOSIS — I513 Intracardiac thrombosis, not elsewhere classified: Secondary | ICD-10-CM | POA: Insufficient documentation

## 2017-05-20 ENCOUNTER — Telehealth: Payer: Self-pay

## 2017-05-20 NOTE — Telephone Encounter (Signed)
Left message for patient to return call to office. 

## 2017-05-25 ENCOUNTER — Telehealth: Payer: Self-pay | Admitting: Internal Medicine

## 2017-05-25 ENCOUNTER — Other Ambulatory Visit: Payer: Self-pay | Admitting: *Deleted

## 2017-05-25 MED ORDER — APIXABAN 5 MG PO TABS: 5.0000 mg | ORAL_TABLET | Freq: Two times a day (BID) | ORAL | 3 refills | Status: DC

## 2017-05-25 NOTE — Telephone Encounter (Signed)
Requested Prescriptions   Signed Prescriptions Disp Refills  . apixaban (ELIQUIS) 5 MG TABS tablet 180 tablet 3    Sig: Take 1 tablet (5 mg total) by mouth 2 (two) times daily.    Authorizing Provider: END, CHRISTOPHER    Ordering User: Britt Bottom

## 2017-05-25 NOTE — Telephone Encounter (Signed)
°*  STAT* If patient is at the pharmacy, call can be transferred to refill team.   1. Which medications need to be refilled? (please list name of each medication and dose if known)  eliquis  2. Which pharmacy/location (including street and city if local pharmacy) is medication to be sent to? [facility] [place]  3. Do they need a 30 day or 90 day supply? 90 day

## 2017-05-27 ENCOUNTER — Ambulatory Visit (INDEPENDENT_AMBULATORY_CARE_PROVIDER_SITE_OTHER): Payer: Medicare Other | Admitting: General Surgery

## 2017-05-27 ENCOUNTER — Encounter: Payer: Self-pay | Admitting: General Surgery

## 2017-05-27 VITALS — BP 163/102 | HR 99 | Temp 98.2°F | Ht 63.0 in | Wt 146.0 lb

## 2017-05-27 DIAGNOSIS — K432 Incisional hernia without obstruction or gangrene: Secondary | ICD-10-CM

## 2017-05-27 DIAGNOSIS — K433 Parastomal hernia with obstruction, without gangrene: Secondary | ICD-10-CM | POA: Insufficient documentation

## 2017-05-27 DIAGNOSIS — K435 Parastomal hernia without obstruction or  gangrene: Secondary | ICD-10-CM | POA: Diagnosis not present

## 2017-05-27 NOTE — Progress Notes (Signed)
Outpatient Surgical Follow Up  05/27/2017  Carly Khan is an 66 y.o. female.   Chief Complaint  Patient presents with  . Follow-up    Colonic Fistula- Follow up on results    HPI: 66 year old female returns to clinic for follow-up from recent studies that were obtained. In the interim she required hospitalization secondary to mural thrombus seen on imaging as well as acute kidney failure that required short run of dialysis. She reports doing much better since but continues on anticoagulation. She currently states she's doing well and denies any fevers, chills, nausea, vomiting, chest pain, shortness breath, diarrhea, constipation. Continues to have intermittent bulging to her midline and right lower quadrant causing discomfort.  Past Medical History:  Diagnosis Date  . CAD in native artery 02/28/2017  . Cancer of left lung ([place]) 2016   LUL lung resection  . CKD (chronic kidney disease) stage 3, GFR 30-59 ml/min 04/09/2017  . 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 Essential hypertension 04/09/2017  . History of sebaceous cyst 02/28/2017  . Intermittent vertigo 04/09/2017  . Itching 02/28/2017  . 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 ABDOMINAL HYSTERECTOMY    . cardiac bypass  1990  . carpel tunnel    . CHOLECYSTECTOMY    . COLON SURGERY    . COLONOSCOPY    . CT guided needle placement    . EXPLORATORY LAPAROTOMY    . GALLBLADDER SURGERY    . loop ileostomy    . SIGMOID RESECTION / RECTOPEXY    . TEE WITHOUT CARDIOVERSION N/A 05/04/2017   Procedure: TRANSESOPHAGEAL ECHOCARDIOGRAM (TEE);  Surgeon: Wellington Hampshire, MD;  Location: [facility] ORS;  Service: Cardiovascular;  Laterality: N/A;  . THORACOSCOPY    . vaginectomy      Family History  Problem Relation Age of Onset  . Cancer Mother   . Stomach cancer Mother   . Heart disease Father   . Hypertension Father   . Parkinson's disease Sister   . Cervical cancer Sister      Social History:  reports that she has been smoking.  She has a 20.00 pack-year smoking history. She has never used smokeless tobacco. She reports that she does not drink alcohol or use drugs.  Allergies:  Allergies  Allergen Reactions  . Penicillin G Hives and Itching    Medications reviewed.    ROS A multipoint review of systems was completed. All pertinent positives and negatives are documented within the history of present illness and remainder are negative   BP (!) 163/102   Pulse 99   Temp 98.2 F (36.8 C) (Oral)   Ht 5\' 3"  (1.6 m)   Wt 66.2 kg (146 lb)   BMI 25.86 kg/m   Physical Exam Gen.: No acute distress Chest: Clear to auscultation Heart: Regular rate and rhythm Abdomen: Soft, nontender, nondistended. Obvious upper midline incisional hernia and right lower quadrant peristomal hernia at the site of her loop ileostomy.     No results found for this or any previous visit (from the past 48 hour(s)). No results found.  Assessment/Plan:  1. Incisional hernia, without obstruction or gangrene Patient with midline incisional hernia. Discussed that should be repaired at the same time as her peristomal hernia after she completes the outpatient workup that will include endoscopy.  2. Parastomal hernia without obstruction or gangrene As above. Plan would be to repair parastomal hernia the takedown of her loop ileostomy. Discussed she will  need to be medically optimized and have to have a normal endoscopy prior to any surgery. Patient voiced understanding and will follow-up in clinic 1 week after her colonoscopy.  A total of 15 minutes was used on this encounter greater than 50% of it used for counseling and coordination of care.   Clayburn Pert, MD FACS General Surgeon  05/27/2017,12:58 PM

## 2017-05-27 NOTE — Patient Instructions (Signed)
We will see you back after your Colonoscopy, appointment with ENT and Cardiology.  We will send Anticoagulant Clearance and Cardiology Clearance.  Please call our office with any concerns while awaiting for all testing to be completed.

## 2017-06-03 ENCOUNTER — Ambulatory Visit: Payer: Medicare Other | Admitting: Nurse Practitioner

## 2017-06-03 ENCOUNTER — Encounter: Payer: Self-pay | Admitting: Nurse Practitioner

## 2017-06-11 ENCOUNTER — Ambulatory Visit: Payer: Medicare Other | Admitting: General Surgery

## 2017-06-15 ENCOUNTER — Ambulatory Visit: Payer: Medicare Other | Admitting: Gastroenterology

## 2017-06-17 ENCOUNTER — Encounter: Payer: Self-pay | Admitting: Internal Medicine

## 2017-06-17 ENCOUNTER — Ambulatory Visit: Payer: Medicare Other | Admitting: Internal Medicine

## 2017-06-17 ENCOUNTER — Ambulatory Visit (INDEPENDENT_AMBULATORY_CARE_PROVIDER_SITE_OTHER): Payer: Medicare Other | Admitting: Internal Medicine

## 2017-06-17 VITALS — BP 178/98 | HR 81 | Ht 64.0 in | Wt 152.5 lb

## 2017-06-17 DIAGNOSIS — I480 Paroxysmal atrial fibrillation: Secondary | ICD-10-CM

## 2017-06-17 DIAGNOSIS — N179 Acute kidney failure, unspecified: Secondary | ICD-10-CM | POA: Diagnosis not present

## 2017-06-17 DIAGNOSIS — I10 Essential (primary) hypertension: Secondary | ICD-10-CM | POA: Diagnosis not present

## 2017-06-17 DIAGNOSIS — R0602 Shortness of breath: Secondary | ICD-10-CM | POA: Diagnosis not present

## 2017-06-17 DIAGNOSIS — I513 Intracardiac thrombosis, not elsewhere classified: Secondary | ICD-10-CM | POA: Diagnosis not present

## 2017-06-17 DIAGNOSIS — I2510 Atherosclerotic heart disease of native coronary artery without angina pectoris: Secondary | ICD-10-CM

## 2017-06-17 DIAGNOSIS — R9431 Abnormal electrocardiogram [ECG] [EKG]: Secondary | ICD-10-CM

## 2017-06-17 DIAGNOSIS — Z01810 Encounter for preprocedural cardiovascular examination: Secondary | ICD-10-CM

## 2017-06-17 MED ORDER — METOPROLOL SUCCINATE ER 25 MG PO TB24: 25.0000 mg | ORAL_TABLET | Freq: Two times a day (BID) | ORAL | 3 refills | Status: DC

## 2017-06-17 NOTE — Patient Instructions (Addendum)
Medication Instructions:  Your physician has recommended you make the following change in your medication:  1- INCREASE Metoprolol Succinate to 25 mg (1 tablet) by mouth two times a day.   Labwork: none  Testing/Procedures: Your physician has requested that you have a lexiscan myoview. For further information please visit [email or web address]. Please follow instruction sheet, as given.  [place]  Your caregiver has ordered a Stress Test with nuclear imaging. The purpose of this test is to evaluate the blood supply to your heart muscle. This procedure is referred to as a "Non-Invasive Stress Test." This is because other than having an IV started in your vein, nothing is inserted or "invades" your body. Cardiac stress tests are done to find areas of poor blood flow to the heart by determining the extent of coronary artery disease (CAD). Some patients exercise on a treadmill, which naturally increases the blood flow to your heart, while others who are  unable to walk on a treadmill due to physical limitations have a pharmacologic/chemical stress agent called Lexiscan . This medicine will mimic walking on a treadmill by temporarily increasing your coronary blood flow.   Please note: these test may take anywhere between 2-4 hours to complete  PLEASE REPORT TO [facility] AT THE FIRST DESK WILL DIRECT YOU WHERE TO GO  Date of Procedure:_____08/22/18___________  Arrival Time for Procedure:_____08:45 AM______  Instructions regarding medication:    _X_:  Hold betablocker(METOPROLOL) the night before procedure and morning of procedure    PLEASE NOTIFY THE OFFICE AT LEAST 24 HOURS IN ADVANCE IF YOU ARE UNABLE TO KEEP YOUR APPOINTMENT.  [phone] AND  PLEASE NOTIFY NUCLEAR MEDICINE AT [facility] AT LEAST 24 HOURS IN ADVANCE IF YOU ARE UNABLE TO KEEP YOUR APPOINTMENT. [phone]  How to prepare for your Myoview test:  1. Do not eat or drink after  midnight 2. No caffeine for 24 hours prior to test 3. No smoking 24 hours prior to test. 4. Your medication may be taken with water.  If your doctor stopped a medication because of this test, do not take that medication. 5. Ladies, please do not wear dresses.  Skirts or pants are appropriate. Please wear a short sleeve shirt. 6. No perfume, cologne or lotion. 7. Wear comfortable walking shoes. No heels!        Follow-Up: Your physician recommends that you schedule a follow-up appointment in: 3 MONTHS  WITH DR END.   If you need a refill on your cardiac medications before your next appointment, please call your pharmacy.   Cardiac Nuclear Scan A cardiac nuclear scan is a test that measures blood flow to the heart when a person is resting and when he or she is exercising. The test looks for problems such as:  Not enough blood reaching a portion of the heart.  The heart muscle not working normally.  You may need this test if:  You have heart disease.  You have had abnormal lab results.  You have had heart surgery or angioplasty.  You have chest pain.  You have shortness of breath.  In this test, a radioactive dye (tracer) is injected into your bloodstream. After the tracer has traveled to your heart, an imaging device is used to measure how much of the tracer is absorbed by or distributed to various areas of your heart. This procedure is usually done at a hospital and takes 2-4 hours. Tell a health care provider about:  Any allergies you have.  All  medicines you are taking, including vitamins, herbs, eye drops, creams, and over-the-counter medicines.  Any problems you or family members have had with the use of anesthetic medicines.  Any blood disorders you have.  Any surgeries you have had.  Any medical conditions you have.  Whether you are pregnant or may be pregnant. What are the risks? Generally, this is a safe procedure. However, problems may occur,  including:  Serious chest pain and heart attack. This is only a risk if the stress portion of the test is done.  Rapid heartbeat.  Sensation of warmth in your chest. This usually passes quickly.  What happens before the procedure?  Ask your health care provider about changing or stopping your regular medicines. This is especially important if you are taking diabetes medicines or blood thinners.  Remove your jewelry on the day of the procedure. What happens during the procedure?  An IV tube will be inserted into one of your veins.  Your health care provider will inject a small amount of radioactive tracer through the tube.  You will wait for 20-40 minutes while the tracer travels through your bloodstream.  Your heart activity will be monitored with an electrocardiogram (ECG).  You will lie down on an exam table.  Images of your heart will be taken for about 15-20 minutes.  You may be asked to exercise on a treadmill or stationary bike. While you exercise, your heart's activity will be monitored with an ECG, and your blood pressure will be checked. If you are unable to exercise, you may be given a medicine to increase blood flow to parts of your heart.  When blood flow to your heart has peaked, a tracer will again be injected through the IV tube.  After 20-40 minutes, you will get back on the exam table and have more images taken of your heart.  When the procedure is over, your IV tube will be removed. The procedure may vary among health care providers and hospitals. Depending on the type of tracer used, scans may need to be repeated 3-4 hours later. What happens after the procedure?  Unless your health care provider tells you otherwise, you may return to your normal schedule, including diet, activities, and medicines.  Unless your health care provider tells you otherwise, you may increase your fluid intake. This will help flush the contrast dye from your body. Drink enough fluid  to keep your urine clear or pale yellow.  It is up to you to get your test results. Ask your health care provider, or the department that is doing the test, when your results will be ready. Summary  A cardiac nuclear scan measures the blood flow to the heart when a person is resting and when he or she is exercising.  You may need this test if you are at risk for heart disease.  Tell your health care provider if you are pregnant.  Unless your health care provider tells you otherwise, increase your fluid intake. This will help flush the contrast dye from your body. Drink enough fluid to keep your urine clear or pale yellow. This information is not intended to replace advice given to you by your health care provider. Make sure you discuss any questions you have with your health care provider. Document Released: 11/14/2004 Document Revised: 10/22/2016 Document Reviewed: 09/28/2013 Elsevier Interactive Patient Education  2017 [employer].

## 2017-06-17 NOTE — Progress Notes (Signed)
Follow-up Outpatient Visit Date: 06/17/2017  Primary Care Provider: [place], [place] [address]  Chief Complaint: Follow-up left atrial thrombus  HPI:  Carly Khan is a 66 y.o. 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 She underwent chest and abdominal CT in anticipation of ostomy takedown in June. Mass was incidentally noted in the left atrial appendage, prompting urgent referral to cardiology. Transthoracic and transesophageal echocardiograms were unable to conclusively visualize the mass. However, decision was made to anticoagulate for at least a month and obtain an event monitor. Event monitor has subsequently revealed paroxysmal atrial fibrillation. Patient was hospitalized after our last visit due to acute kidney injury that briefly required hemodialysis.  Today, Carly Khan notes considerable exertional dyspnea is present with even mild activity. This seems to be a little bit worse than at her prior visit in late June. She also has frequent dizziness that she describes as "vertigo." This has been present for years and seems to be worsened by lying on her left side or moving her head in certain ways. She has an upcoming visit with ENT for further evaluation. She denies chest pain, orthopnea, PND, and palpitations. She has occasional ankle edema. She remains on apixaban, which she has been tolerating well. She is hopeful to undergo colonoscopy followed by surgical intervention on her ostomy and abdominal wall hernias in the near future. She continues to smoke and states that she has been unable to procure nicotine patches due to  cost.  --------------------------------------------------------------------------------------------------  Cardiovascular History & Procedures: Cardiovascular Problems:  Left atrial appendage thrombus  Coronary artery disease status post CABG  Paroxysmal atrial fibrillation  Risk Factors:  Known coronary artery disease, hypertension, sedentary lifestyle, tobacco use, and age greater than 42  Cath/PCI:  Not available  CV Surgery:  CABG (1991, [place]): Details unknown  EP Procedures and Devices:  30 day event monitor (05/07/17): Predominantly sinus rhythm with PACs and PVCs. Paroxysmal atrial fibrillation with rapid ventricular response noted (a-fib burden less than 1% but may be underestimated).  Non-Invasive Evaluation(s):  TEE (05/04/17): Normal LV function (EF 60-65%) with normal wall motion. No left atrial or appendage thrombus. No right atrial or appendage thrombus. Lipomatous hypertrophy of the intra-atrial septum without atrial level shunt.  TTE (04/29/17): Normal LV size with moderate LVH. LVEF 65-70% with grade 1 diastolic dysfunction and elevated filling pressures. Mildly thickened aortic and mitral valve leaflets. Lipomatous hypertrophy of the interatrial septum. Mildly dilated pulmonary arteries. Normal RV size and function.  Recent CV Pertinent Labs: Lab Results  Component Value Date   INR 1.30 04/29/2017   K 4.5 05/04/2017   MG 2.7 (H) 04/29/2017   BUN 25 (H) 05/04/2017   CREATININE 1.05 (H) 05/04/2017    Past medical and surgical history were reviewed and updated in EPIC.  Current Meds  Medication Sig  . albuterol (PROVENTIL HFA;VENTOLIN HFA) 108 (90 Base) MCG/ACT inhaler Inhale 2 puffs into the lungs every 6 (six) hours as needed.  Marland Kitchen apixaban (ELIQUIS) 5 MG TABS tablet Take 1 tablet (5 mg total) by mouth 2 (two) times daily.  Marland Kitchen aspirin EC 81 MG tablet Take 1 tablet by mouth daily.  . meclizine (ANTIVERT) 25 MG tablet Take 25 mg by mouth 3  (three)  times daily as needed for dizziness.  . metoprolol succinate (TOPROL-XL) 25 MG 24 hr tablet Take by mouth.  . pravastatin (PRAVACHOL) 10 MG tablet Take 10 mg by mouth at bedtime.    Allergies: Penicillin g  Social History   Social History  . Marital status: Divorced    Spouse name: N/A  . Number of children: N/A  . Years of education: N/A   Occupational History  . Not on file.   Social History Main Topics  . Smoking status: Current Every Day Smoker    Packs/day: 0.50    Years: 40.00  . Smokeless tobacco: Never Used     Comment: denied smoking info  . Alcohol use No  . Drug use: No  . Sexual activity: No   Other Topics Concern  . Not on file   Social History Narrative  . No narrative on file    Family History  Problem Relation Age of Onset  . Cancer Mother   . Stomach cancer Mother   . Heart disease Father   . Hypertension Father   . Parkinson's disease Sister   . Cervical cancer Sister     Review of Systems: A 12-system review of systems was performed and was negative except as noted in the HPI.  --------------------------------------------------------------------------------------------------  Physical Exam: BP (!) 178/98 (BP Location: Left Arm, Patient Position: Sitting, Cuff Size: Normal)   Pulse 81   Ht 5\' 4"  (1.626 m)   Wt 152 lb 8 oz (69.2 kg)   BMI 26.18 kg/m   General:  Overweight woman, seated comfortably in the exam room. HEENT: No conjunctival pallor or scleral icterus. Moist mucous membranes.  OP clear. Neck: Supple without lymphadenopathy, thyromegaly, JVD, or HJR. No carotid bruit. Lungs: Normal work of breathing. Coarse breath sounds with scattered rhonchi. No wheezes or crackles.. Heart: Regular rate and rhythm without murmurs, rubs, or gallops. Non-displaced PMI. Abd: Bowel sounds present. Soft, NT/ND without hepatosplenomegaly Ext: No lower extremity edema. Radial, PT, and DP pulses are 2+ bilaterally. Skin: Warm and dry  without rash.  EKG:  Normal sinus rhythm with septal Q waves and nonspecific T-wave changes.  Lab Results  Component Value Date   WBC 6.1 05/04/2017   HGB 11.3 (L) 05/04/2017   HCT 32.0 (L) 05/04/2017   MCV 95.1 05/04/2017   PLT 138 (L) 05/04/2017    Lab Results  Component Value Date   NA 135 05/04/2017   K 4.5 05/04/2017   CL 110 05/04/2017   CO2 22 05/04/2017   BUN 25 (H) 05/04/2017   CREATININE 1.05 (H) 05/04/2017   GLUCOSE 92 05/04/2017    No results found for: CHOL, HDL, LDLCALC, LDLDIRECT, TRIG, CHOLHDL  --------------------------------------------------------------------------------------------------  ASSESSMENT AND PLAN: Paroxysmal atrial fibrillation and left atrial thrombus Carly Khan has been a symptomatically without palpitations or symptoms to suggest cardioembolic phenomena. She remains on apixaban 5 mg twice a day, which she is tolerating well. Given monitor demonstrating paroxysmal atrial fibrillation with rapid ventricular response, we have agreed to increase metoprolol succinate to 25 mg twice a day. She will need lifelong anticoagulation, given CHADSVASC score of of at least 4, though her risk is even higher than predicted with this model given known left atrial appendage thrombus. I would like to continue at least 3 months of uninterrupted anticoagulation before considering temporary discontinuation of apixaban for invasive procedures.  Shortness of breath, abnormal EKG, and history of coronary artery disease status post CABG Carly Khan has chronic shortness of breath with exertion  that has worsened slightly since her prior visit. Echocardiogram showed preserved LV function. She appears euvolemic on exam today but with markedly elevated blood pressure. Her uncontrolled hypertension could certainly be contributing to her shortness of breath and fatigue. However, worsening coronary insufficiency is also a consideration. EKG demonstrates septal Q waves and nonspecific  T-wave changes. We have agreed to obtain a pharmacologic myocardial perfusion stress test for further evaluation. We will also increase metoprolol, as above.  Preoperative cardiovascular risk assessment We have been asked to provide guidance regarding anticoagulation and perioperative risk for upcoming colonoscopy and potential ostomy takedown and hernia repair. As above, I would like to complete at least 3 months of continuous anticoagulation due to known left atrial thrombus and paroxysmal atrial fibrillation. I think it would also be worthwhile to exclude ischemia given Carly Khan significant exertional dyspnea. Further preoperative recommendations will be made after completion of the stress test.  Hypertension Blood pressure is quite elevated today. As above, we will increase metoprolol. Carly Khan will likely need additional antihypertensive agents. She reports that she is scheduled to see Dr. Candiss Norse later this month. I will defer additional medication changes to Dr. Candiss Norse.  Acute kidney injury Creatinine has improved close to baseline based on most recent labs in our system. We will avoid nephrotoxic drugs. I defer ongoing management to Dr. Candiss Norse and nephrology.  Follow-up: Return to clinic in 3 months.  [name], MD 06/17/2017 11:51 AM

## 2017-06-18 ENCOUNTER — Telehealth: Payer: Self-pay | Admitting: *Deleted

## 2017-06-18 NOTE — Telephone Encounter (Signed)
Received incoming fax from [facility] requesting clearance and clarification about stopping low dose aspirin and Eliquis for fine needle aspiration of a parotid mass and possible parotidectomy.  Patient currently on anticoagulation therapy for left atrial thrombus as noted in office visit on 06/17/17 with Dr End: "Preoperative cardiovascular risk assessment We have been asked to provide guidance regarding anticoagulation and perioperative risk for upcoming colonoscopy and potential ostomy takedown and hernia repair. As above, I would like to complete at least 3 months of continuous anticoagulation due to known left atrial thrombus and paroxysmal atrial fibrillation."   Routing to Dr End for further advice for procedure.

## 2017-06-18 NOTE — Telephone Encounter (Signed)
Carly Khan is not cleared until stress test has been completed and 3 months of anticoagulation completed. Thanks.  [name], MD [facility] Pager: [phone]

## 2017-06-18 NOTE — Telephone Encounter (Signed)
Encounter routed to number provided via EPIC.

## 2017-06-24 ENCOUNTER — Encounter
Admission: RE | Admit: 2017-06-24 | Discharge: 2017-06-24 | Disposition: A | Discharge status: Home / Self Care | Payer: Medicare Other | Source: Ambulatory Visit | Attending: Internal Medicine | Admitting: Internal Medicine

## 2017-06-24 ENCOUNTER — Ambulatory Visit: Payer: Medicare Other | Admitting: Gastroenterology

## 2017-06-24 DIAGNOSIS — R0602 Shortness of breath: Secondary | ICD-10-CM | POA: Insufficient documentation

## 2017-06-24 DIAGNOSIS — R9431 Abnormal electrocardiogram [ECG] [EKG]: Secondary | ICD-10-CM | POA: Insufficient documentation

## 2017-06-24 LAB — NM MYOCAR MULTI W/SPECT W/WALL MOTION / EF
CHL CUP NUCLEAR SRS: 0
CSEPHR: 64 %
CSEPPHR: 99 {beats}/min
LV sys vol: 8 mL
LVDIAVOL: 35 mL (ref 46–106)
Rest HR: 62 {beats}/min
SDS: 4
SSS: 4
TID: 0.96

## 2017-06-24 MED ORDER — TECHNETIUM TC 99M TETROFOSMIN IV KIT
30.0000 | PACK | Freq: Once | INTRAVENOUS | Status: AC | PRN
Start: 2017-06-24 — End: 2017-06-24
  Administered 2017-06-24: 31.2 via INTRAVENOUS

## 2017-06-24 MED ORDER — REGADENOSON 0.4 MG/5ML IV SOLN
0.4000 mg | Freq: Once | INTRAVENOUS | Status: AC
  Administered 2017-06-24: 0.4 mg via INTRAVENOUS
  Filled 2017-06-24: qty 5

## 2017-06-24 MED ORDER — TECHNETIUM TC 99M TETROFOSMIN IV KIT
13.0000 | PACK | Freq: Once | INTRAVENOUS | Status: AC | PRN
  Administered 2017-06-24: 12.72 via INTRAVENOUS

## 2017-06-26 ENCOUNTER — Ambulatory Visit: Payer: Medicare Other | Admitting: Gastroenterology

## 2017-07-01 ENCOUNTER — Ambulatory Visit: Payer: Medicare Other | Admitting: Gastroenterology

## 2017-07-01 ENCOUNTER — Encounter: Payer: Self-pay | Admitting: Gastroenterology

## 2017-07-01 ENCOUNTER — Other Ambulatory Visit: Payer: Self-pay

## 2017-07-01 ENCOUNTER — Encounter (INDEPENDENT_AMBULATORY_CARE_PROVIDER_SITE_OTHER): Payer: Self-pay

## 2017-07-01 ENCOUNTER — Telehealth: Payer: Self-pay | Admitting: Gastroenterology

## 2017-07-01 ENCOUNTER — Ambulatory Visit (INDEPENDENT_AMBULATORY_CARE_PROVIDER_SITE_OTHER): Payer: Medicare Other | Admitting: Gastroenterology

## 2017-07-01 VITALS — BP 152/91 | HR 78 | Temp 98.1°F | Ht 63.0 in | Wt 152.8 lb

## 2017-07-01 DIAGNOSIS — R7309 Other abnormal glucose: Secondary | ICD-10-CM

## 2017-07-01 DIAGNOSIS — K7460 Unspecified cirrhosis of liver: Secondary | ICD-10-CM | POA: Diagnosis not present

## 2017-07-01 DIAGNOSIS — D649 Anemia, unspecified: Secondary | ICD-10-CM | POA: Diagnosis not present

## 2017-07-01 NOTE — Telephone Encounter (Signed)
Patient left a voice message that she has a question about her blood work. Please call

## 2017-07-01 NOTE — Progress Notes (Signed)
Cephas Darby, MD [address]  [place]  [place], [place] [address]  Main: [phone]  Fax: [phone]    Gastroenterology Consultation  Referring Provider:     Clayburn Pert, MD Primary Care Physician:  Glendon Axe, MD Primary Gastroenterologist:  Dr. Cephas Darby Reason for Consultation:     Colonoscopy prior to ileostomy takedown        HPI:   Carly Khan is a 66 y.o. y/o female referred by Dr. Glendon Axe, MD  for consultation & management of Perforated diverticulitis status post-sigmoid resection and diverticular loop ileostomy performed in July 2017 in [place]. She has history of coronary artery disease status post CABG, left lung cancer status post resection, tobacco abuse, hypertension, recently found to have left atrial appendage thrombus on a 2-D echo and currently on eliquis. She ran out of this medication yesterday. She denies having any issues with her ileostomy, reports having brown pasty stool. She denies epigastric pain, heartburn, nausea, vomiting. She denies any rectal discharge but sometimes has an urge to have a bowel movement. She is also incidentally found to have nodularity of the liver based on recent CT. However, her LFTs have been normal as of 06/11/2017. Her labs suggest mild normocytic anemia and mild thrombocytopenia. Her hepatitis B surface antigen is negative. Unknown HCV status. She denies IV drugs, tattoos, blood transfusions, multiple sexual partners, heavy Tylenol or NSAID use, herbal supplements. She continues to smoke cigarettes.  GI Procedures: Had a colonoscopy several years ago. Never had an EGD before Mother with stomach cancer at age 27  Past Medical History:  Diagnosis Date  . CAD in native artery    a. s/p CABG in 1991 in [place].  Marland Kitchen Cancer of left lung ([place]) 2016   LUL lung resection  . CKD (chronic kidney disease) stage 3, GFR 30-59 ml/min 04/09/2017   a. 04/2017 ARF & Hyperkalemia req HD x 1.  . 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 Essential hypertension 04/09/2017  . History of sebaceous cyst 02/28/2017  . Intermittent vertigo 04/09/2017  . Itching 02/28/2017  . Left Atrial Appendage Thrombus    a. Incidentally noted on CT 04/2017;  b. 04/2017 Echo: EF 65-70%, Gr1 DD, no LA filling defect seen; c. 05/2017 TEE: EF 60-65%, no RA/LA thrombus.  . S/P CABG (coronary artery bypass graft) 1991    Past Surgical History:  Procedure Laterality Date  . ABDOMINAL HYSTERECTOMY    . cardiac bypass  1990  . carpel tunnel    . CHOLECYSTECTOMY    . COLON SURGERY    . COLONOSCOPY    . CT guided needle placement    . EXPLORATORY LAPAROTOMY    . GALLBLADDER SURGERY    . loop ileostomy    . SIGMOID RESECTION / RECTOPEXY    . TEE WITHOUT CARDIOVERSION N/A 05/04/2017   Procedure: TRANSESOPHAGEAL ECHOCARDIOGRAM (TEE);  Surgeon: Wellington Hampshire, MD;  Location: [facility] ORS;  Service: Cardiovascular;  Laterality: N/A;  . THORACOSCOPY    . vaginectomy      Prior to Admission medications   Medication Sig Start Date End Date Taking? Authorizing Provider  albuterol (PROVENTIL HFA;VENTOLIN HFA) 108 (90 Base) MCG/ACT inhaler Inhale 2 puffs into the lungs every 6 (six) hours as needed. 04/13/17 04/13/18 Yes [provider]  apixaban (ELIQUIS) 5 MG TABS tablet Take 1 tablet (5 mg total) by mouth 2 (two) times daily. 05/25/17  Yes End, Harrell Gave, MD  aspirin EC 81 MG tablet Take 1 tablet by  mouth daily. 04/09/17 04/09/18 Yes [provider]  metoprolol succinate (TOPROL-XL) 25 MG 24 hr tablet Take 1 tablet (25 mg total) by mouth 2 (two) times daily. 06/17/17 06/17/18 Yes End, Harrell Gave, MD  pravastatin (PRAVACHOL) 10 MG tablet Take 10 mg by mouth at bedtime.   Yes [provider]    Family History  Problem Relation Age of Onset  . Cancer Mother   . Stomach cancer Mother   . Heart disease Father   . Hypertension Father   . Parkinson's disease Sister   . Cervical cancer Sister      Social History  Substance  Use Topics  . Smoking status: Current Every Day Smoker    Packs/day: 0.50    Years: 40.00  . Smokeless tobacco: Never Used     Comment: denied smoking info  . Alcohol use No    Allergies as of 07/01/2017 - Review Complete 07/01/2017  Allergen Reaction Noted  . Penicillin g Hives and Itching 04/09/2017    Review of Systems:    All systems reviewed and negative except where noted in HPI.   Physical Exam:  BP (!) 152/91   Pulse 78   Temp 98.1 F (36.7 C) (Oral)   Ht 5\' 3"  (1.6 m)   Wt 152 lb 12.8 oz (69.3 kg)   BMI 27.07 kg/m  No LMP recorded. Patient has had a hysterectomy.  General:   Alert,  Well-developed, well-nourished, pleasant and cooperative in NAD Head:  Normocephalic and atraumatic. Eyes:  Sclera clear, no icterus.   Conjunctiva pink. Ears:  Normal auditory acuity. Nose:  No deformity, discharge, or lesions. Neck:  Supple; no masses or thyromegaly. Lungs:  Respirations even and unlabored.  Clear throughout to auscultation.   No wheezes, crackles, or rhonchi. No acute distress. Heart:  Regular rate and rhythm; no murmurs, clicks, rubs, or gallops. Abdomen:  Normal bowel sounds.  Soft, non-tender and non-distended without masses, hepatosplenomegaly noted.  No guarding or rebound tenderness.Diverting loop ileostomy in the right lower quadrant, brown thick stool in the ostomy bag, stomal mucosa appeared healthy Rectal: Nor performed Msk:  Symmetrical without gross deformities. Good, equal movement & strength bilaterally. Pulses:  Normal pulses noted. Extremities:  No clubbing or edema.  No cyanosis. Neurologic:  Alert and oriented x3;  grossly normal neurologically. Skin:  Intact without significant lesions or rashes. No jaundice. Psych:  Alert and cooperative. Normal mood and affect.  Imaging Studies:  CT A/P 04/28/2017 IMPRESSION: 1. Low-attenuation 1.0 cm filling defect in the left atrial appendage, suggestive of thrombus. Echocardiographic  correlation advised. 2. No evidence of local tumor recurrence in the left lung status post left upper lobectomy. 3. No findings of metastatic disease in the chest, abdomen or pelvis. 4. 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 No evidence of bowel obstruction or ischemia. 5. Intact appearing colorectal anastomosis status post subtotal distal colectomy. Minimal residual uncomplicated diverticulosis in the distal colon. 6. Suspected cirrhosis. Consider hepatic elastography for further liver fibrosis risk stratification, as clinically warranted.  Assessment and Plan:   Carly Khan is a 66 y.o. y/o female with Coronary artery disease status post CABG, paroxysmal A. fib, left atrial appendage on anticoagulation, perforated diverticulitis with fistula status post sigmoid colectomy and diverting loop ileostomy in 2017 referred here for colonoscopy. She has a loop ileostomy, she will need bowel prep via efferent ileal limb.  She has evidence of nodularity on her  liver based on the recent abdominal cross-sectional imaging. However, her labs do not suggest any evidence of chronicity. her LFTs have been normal as of 06/11/2017. Her labs suggest mild normocytic anemia and mild thrombocytopenia. There is no evidence of active GI bleed. Her hepatitis B surface antigen is negative.I will perform EGD for variceal screening. Check for HCV status. I will hold off on further secondary liver disease workup for now.   She has been seen by Dr. Saunders Revel who is her cardiologist and recommended at least 3 months of continuous anticoagulation due to known left atrial thrombus and paroxysmal atrial fibrillation, also a stress test prior to colonoscopy, ostomy takedown and hernia repair. Please refer her for direct colonoscopy after she is cleared by Dr. Saunders Revel.   Follow up as needed   Cephas Darby, MD

## 2017-07-01 NOTE — Patient Instructions (Addendum)
1. Schedule colonoscopy and EGD after clearance from cardiology   Please call our office to speak with my nurse Driscilla Grammes at [phone] during business hours from 8am to 4pm if you have any questions/concerns. During after hours, you will be redirected to on call GI physician. For any emergency please call 911 or go the nearest emergency room.    Cephas Darby, MD [address]  [place]  [place], [place] [address]  Main: [phone]  Fax: [phone]

## 2017-07-02 ENCOUNTER — Telehealth: Payer: Self-pay | Admitting: Internal Medicine

## 2017-07-02 ENCOUNTER — Telehealth: Payer: Self-pay | Admitting: Gastroenterology

## 2017-07-02 NOTE — Telephone Encounter (Signed)
Patient has some questions to ask and would like for you to call her.

## 2017-07-02 NOTE — Telephone Encounter (Signed)
Patient has been informed she may go to [facility] for her labwork. She can only go on Wed or Thu when she has transportation.

## 2017-07-02 NOTE — Telephone Encounter (Signed)
Continuation of note: She will call her this evening to see if her daughter can come by office in the morning prior to working to get the samples.  I called patient's pharmacy to get price. It will be $167.00 for a 1 month's supply of Eliquis.  I notified patient of this. She said she does not have the money for this. Again, I stressed if there is any way for her to get this prescription filled this month it was important for her health.  In the meantime, she can be working to get patient assistance. I strongly encouraged patient to call today the numbers to get patient assistance and she verbalized understanding. Samples and Eliquis patient assistance application left at front desk for pick up.    Medication Samples have been provided to the patient.  Drug name: Eliquis       Strength: 2.5 mg        Qty: 2 boxes (28 tablets)  LOT: [id number]  Exp.Date: Jun 2019  Dosing instructions: Take 2 tablets by mouth twice a day.  The patient has been instructed regarding the correct time, dose, and frequency of taking this medication, including desired effects and most common side effects.   Roney Jaffe 11:16 AM 07/02/2017

## 2017-07-02 NOTE — Telephone Encounter (Signed)
Please advise.   Made patient aware that we are out of samples. Patient states it is too expensive and that she did not want a prescription sent in. She is currently out of this medication and wants to know if she will be able to come off of this soon or if it is long term.

## 2017-07-02 NOTE — Telephone Encounter (Signed)
Patient calling the office for samples of medication:   1.  What medication and dosage are you requesting samples for? Eliquis 5 mg  2.  Are you currently out of this medication?  Yes

## 2017-07-02 NOTE — Telephone Encounter (Signed)
S/w patient. She is out of her Eliquis. Her last dose was 06/30/17. We have 2.5 mg tablets only at this time. We are in the process of contacting the Eliquis drug rep. Patient advised we can supply the Eliquis 2.5 mg, take 2 tablets by mouth twice a day. This supply is only for 7 days. Encouraged patient to call us back next week to see if we have more samples. Advised patient to call the Eliquis Patient assistance and [employer]. Numbers to these provided to patient. Stressed the importance of taking the Eliquis r/t her left atrial appendage thrombus and she verbalized understanding.  Patient's other issue is she does not drive or have a car. Her daughter works during the day and does not get off until 5 pm. She

## 2017-07-08 ENCOUNTER — Other Ambulatory Visit
Admission: RE | Admit: 2017-07-08 | Discharge: 2017-07-08 | Disposition: A | Discharge status: Home / Self Care | Payer: Medicare Other | Source: Ambulatory Visit | Attending: Gastroenterology | Admitting: Gastroenterology

## 2017-07-08 ENCOUNTER — Other Ambulatory Visit: Admission: RE | Admit: 2017-07-08 | Payer: Medicare Other | Source: Ambulatory Visit | Admitting: Oncology

## 2017-07-08 DIAGNOSIS — K7460 Unspecified cirrhosis of liver: Secondary | ICD-10-CM | POA: Diagnosis present

## 2017-07-08 DIAGNOSIS — R7309 Other abnormal glucose: Secondary | ICD-10-CM | POA: Diagnosis present

## 2017-07-08 LAB — HEMOGLOBIN A1C
Hgb A1c MFr Bld: 5.4 % (ref 4.8–5.6)
Mean Plasma Glucose: 108.28 mg/dL

## 2017-07-10 ENCOUNTER — Emergency Department
Admission: EM | Admit: 2017-07-10 | Discharge: 2017-07-11 | Disposition: A | Discharge status: Home / Self Care | Payer: Medicare Other | Attending: Emergency Medicine | Admitting: Emergency Medicine

## 2017-07-10 ENCOUNTER — Emergency Department: Payer: Medicare Other

## 2017-07-10 ENCOUNTER — Encounter: Payer: Self-pay | Admitting: Emergency Medicine

## 2017-07-10 DIAGNOSIS — R0602 Shortness of breath: Secondary | ICD-10-CM | POA: Insufficient documentation

## 2017-07-10 DIAGNOSIS — Z7982 Long term (current) use of aspirin: Secondary | ICD-10-CM | POA: Insufficient documentation

## 2017-07-10 DIAGNOSIS — N183 Chronic kidney disease, stage 3 (moderate): Secondary | ICD-10-CM | POA: Diagnosis not present

## 2017-07-10 DIAGNOSIS — Z7901 Long term (current) use of anticoagulants: Secondary | ICD-10-CM | POA: Insufficient documentation

## 2017-07-10 DIAGNOSIS — F17200 Nicotine dependence, unspecified, uncomplicated: Secondary | ICD-10-CM | POA: Diagnosis not present

## 2017-07-10 DIAGNOSIS — I10 Essential (primary) hypertension: Secondary | ICD-10-CM

## 2017-07-10 DIAGNOSIS — I129 Hypertensive chronic kidney disease with stage 1 through stage 4 chronic kidney disease, or unspecified chronic kidney disease: Secondary | ICD-10-CM | POA: Diagnosis not present

## 2017-07-10 DIAGNOSIS — Z79899 Other long term (current) drug therapy: Secondary | ICD-10-CM | POA: Insufficient documentation

## 2017-07-10 DIAGNOSIS — I25729 Atherosclerosis of autologous artery coronary artery bypass graft(s) with unspecified angina pectoris: Secondary | ICD-10-CM | POA: Insufficient documentation

## 2017-07-10 DIAGNOSIS — R270 Ataxia, unspecified: Secondary | ICD-10-CM | POA: Insufficient documentation

## 2017-07-10 LAB — CBC WITH DIFFERENTIAL/PLATELET
BASOS ABS: 0.1 10*3/uL (ref 0–0.1)
Basophils Relative: 1 %
EOS PCT: 4 %
Eosinophils Absolute: 0.4 10*3/uL (ref 0–0.7)
HCT: 43.1 % (ref 35.0–47.0)
HEMOGLOBIN: 15.3 g/dL (ref 12.0–16.0)
LYMPHS PCT: 29 %
Lymphs Abs: 2.9 10*3/uL (ref 1.0–3.6)
MCH: 33.8 pg (ref 26.0–34.0)
MCHC: 35.4 g/dL (ref 32.0–36.0)
MCV: 95.3 fL (ref 80.0–100.0)
Monocytes Absolute: 0.8 10*3/uL (ref 0.2–0.9)
Monocytes Relative: 8 %
Neutro Abs: 5.8 10*3/uL (ref 1.4–6.5)
Neutrophils Relative %: 58 %
Platelets: 235 10*3/uL (ref 150–440)
RBC: 4.53 MIL/uL (ref 3.80–5.20)
RDW: 12.4 % (ref 11.5–14.5)
WBC: 10 10*3/uL (ref 3.6–11.0)

## 2017-07-10 LAB — BASIC METABOLIC PANEL
ANION GAP: 9 (ref 5–15)
BUN: 23 mg/dL — ABNORMAL HIGH (ref 6–20)
CALCIUM: 9.6 mg/dL (ref 8.9–10.3)
CHLORIDE: 105 mmol/L (ref 101–111)
CO2: 22 mmol/L (ref 22–32)
CREATININE: 1.19 mg/dL — AB (ref 0.44–1.00)
GFR calc non Af Amer: 46 mL/min — ABNORMAL LOW (ref >60)
GFR, EST AFRICAN AMERICAN: 54 mL/min — AB (ref >60)
Glucose, Bld: 107 mg/dL — ABNORMAL HIGH (ref 65–99)
Potassium: 4.2 mmol/L (ref 3.5–5.1)
SODIUM: 136 mmol/L (ref 135–145)

## 2017-07-10 LAB — TROPONIN I

## 2017-07-10 LAB — BRAIN NATRIURETIC PEPTIDE: B NATRIURETIC PEPTIDE 5: 46 pg/mL (ref 0.0–100.0)

## 2017-07-10 NOTE — ED Notes (Signed)
Pt updated on delay. Pt verbalizes understanding.

## 2017-07-10 NOTE — ED Notes (Signed)
Patient transported to CT 

## 2017-07-10 NOTE — ED Triage Notes (Signed)
Pt with high blood pressure and short of breath for a couple of days.

## 2017-07-10 NOTE — ED Provider Notes (Signed)
[facility] Emergency Department Provider Note _   First MD Initiated Contact with Patient 07/10/17 2216     (approximate)  I have reviewed the triage vital signs and the nursing notes.   HISTORY  Chief Complaint Hypertension and Shortness of Breath   HPI Carly Khan is a 67 y.o. female with below list of chronic medical conditions including hypertension presents to the emergency department with noted hypertension at homewith systolic blood pressures exceeding 200. Patient states that she called her primary care provider Dr. Candiss Norse who advised her to come to the emergency department for evaluation. Patient admits to feeling lightheaded which is what prompted her to check her blood pressure. Lightheadedness continues at this time despite improvement in the patient's blood pressure. Patient was advised by Dr. Candiss Norse to take a half a tablet of a moist pain before arrival to the emergency department which she did. Current blood pressure is 149/82   Past Medical History:  Diagnosis Date  . CAD in native artery    a. s/p CABG in 1991 in [place].  Marland Kitchen Cancer of left lung ([place]) 2016   LUL lung resection  . CKD (chronic kidney disease) stage 3, GFR 30-59 ml/min 04/09/2017   a. 04/2017 ARF & Hyperkalemia req HD x 1.  . 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 Essential hypertension 04/09/2017  . History of sebaceous cyst 02/28/2017  . Intermittent vertigo 04/09/2017  . Itching 02/28/2017  . Left Atrial Appendage Thrombus    a. Incidentally noted on CT 04/2017;  b. 04/2017 Echo: EF 65-70%, Gr1 DD, no LA filling defect seen; c. 05/2017 TEE: EF 60-65%, no RA/LA thrombus.  . S/P CABG (coronary artery bypass graft) 1991    Patient Active Problem List   Diagnosis Date Noted  . Hepatic cirrhosis ([place]) 07/01/2017  . Shortness of breath 06/17/2017  . Paroxysmal atrial fibrillation ([place]) 06/17/2017  . Thrombus of left atrial appendage without antecedent myocardial infarction 06/17/2017  .  Incisional hernia, without obstruction or gangrene 05/27/2017  . 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 Thrombus in heart chamber 05/17/2017  . Hyperkalemia   . Acute renal failure ([place])   . CKD (chronic kidney disease) stage 3, GFR 30-59 ml/min 04/09/2017  . Essential hypertension 04/09/2017  . Intermittent vertigo 04/09/2017  . CAD in native artery 02/28/2017  . 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 History of sebaceous cyst 02/28/2017  . Itching 02/28/2017  . 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 Lung nodule 06/25/2016  . Adenocarcinoma of left lung ([place]) 06/19/2016  . Primary cancer of left upper lobe of lung ([place]) 06/19/2016  . AKI (acute kidney injury) ([place]) 12/29/2015  . 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 ABDOMINAL HYSTERECTOMY    . cardiac bypass  1990  . carpel tunnel    . CHOLECYSTECTOMY    . COLON SURGERY    . COLONOSCOPY    . CT guided needle placement    . EXPLORATORY LAPAROTOMY    . GALLBLADDER SURGERY    . loop ileostomy    . SIGMOID RESECTION / RECTOPEXY    . TEE WITHOUT CARDIOVERSION N/A 05/04/2017   Procedure: TRANSESOPHAGEAL ECHOCARDIOGRAM (TEE);  Surgeon: Wellington Hampshire, MD;  Location: [facility] ORS;  Service: Cardiovascular;  Laterality: N/A;  . THORACOSCOPY    . vaginectomy      Prior to Admission medications  Medication Sig Start Date End Date Taking? Authorizing Provider  albuterol (PROVENTIL HFA;VENTOLIN HFA) 108 (90 Base) MCG/ACT inhaler Inhale 2 puffs into the lungs every 6 (six) hours as needed. 04/13/17 04/13/18 Yes [provider]  amLODipine (NORVASC) 2.5 MG tablet Take 2.5 mg by mouth daily.   Yes [provider]  apixaban (ELIQUIS) 5 MG TABS tablet Take 1 tablet (5 mg total) by mouth 2 (two) times daily. 05/25/17  Yes End, Harrell Gave, MD  aspirin EC 81 MG tablet Take 1  tablet by mouth daily. 04/09/17 04/09/18 Yes [provider]  metoprolol succinate (TOPROL-XL) 25 MG 24 hr tablet Take 1 tablet (25 mg total) by mouth 2 (two) times daily. 06/17/17 06/17/18 Yes End, Harrell Gave, MD  pravastatin (PRAVACHOL) 10 MG tablet Take 10 mg by mouth at bedtime.   Yes [provider]    Allergies Penicillin g  Family History  Problem Relation Age of Onset  . Cancer Mother   . Stomach cancer Mother   . Heart disease Father   . Hypertension Father   . Parkinson's disease Sister   . Cervical cancer Sister     Social History Social History  Substance Use Topics  . Smoking status: Current Every Day Smoker    Packs/day: 0.50    Years: 40.00  . Smokeless tobacco: Never Used     Comment: denied smoking info  . Alcohol use No    Review of Systems Constitutional: No fever/chills Eyes: No visual changes. ENT: No sore throat. Cardiovascular: Denies chest pain. Respiratory: positive for shortness of breath. Gastrointestinal: No abdominal pain.  No nausea, no vomiting.  No diarrhea.  No constipation. Genitourinary: Negative for dysuria. Musculoskeletal: Negative for neck pain.  Negative for back pain. Integumentary: Negative for rash. Neurological: Negative for headaches, focal weakness or numbness.   ____________________________________________   PHYSICAL EXAM:  VITAL SIGNS: ED Triage Vitals [07/10/17 1804]  Enc Vitals Group     BP (!) 201/95     Pulse Rate 70     Resp 20     Temp 98.3 F (36.8 C)     Temp Source Oral     SpO2 98 %     Weight      Height      Head Circumference      Peak Flow      Pain Score      Pain Loc      Pain Edu?      Excl. in [place]?     Constitutional: Alert and oriented. Well appearing and in no acute distress. Eyes: Conjunctivae are normal. Head: Atraumatic.Marland Kitchen Mouth/Throat: Mucous membranes are moist. Oropharynx non-erythematous. Neck: No stridor.   Cardiovascular: Normal rate, regular rhythm. Good  peripheral circulation. Grossly normal heart sounds. Respiratory: Normal respiratory effort.  No retractions. Lungs CTAB. Gastrointestinal: Soft and nontender. No distention.  Musculoskeletal: No lower extremity tenderness nor edema. No gross deformities of extremities. Neurologic:  Normal speech and language. No gross focal neurologic deficits are appreciated.  Skin:  Skin is warm, dry and intact. No rash noted. Psychiatric: Mood and affect are normal. Speech and behavior are normal.  ____________________________________________   LABS (all labs ordered are listed, but only abnormal results are displayed)  Labs Reviewed  BASIC METABOLIC PANEL - Abnormal; Notable for the following:       Result Value   Glucose, Bld 107 (*)    BUN 23 (*)    Creatinine, Ser 1.19 (*)    GFR calc non Af Wyvonnia Lora  46 (*)    GFR calc Af Amer 54 (*)    All other components within normal limits  CBC WITH DIFFERENTIAL/PLATELET  BRAIN NATRIURETIC PEPTIDE  TROPONIN I   ____________________________________________  EKG  ED ECG REPORT I, Brandon N Icarus Partch, the attending physician, personally viewed and interpreted this ECG.   Date: 07/10/2017  EKG Time: 6:05 PM  Rate: 70  Rhythm: normal sinus rhythm  Axis: normal  Intervals:Normal  ST&T Change:none  ____________________________________________  RADIOLOGY I, Stanhope Ernst Bowler, personally viewed and evaluated these images (plain radiographs) as part of my medical decision making, as well as reviewing the written report by the radiologist.  Dg Chest 2 View  Result Date: 07/10/2017 CLINICAL DATA:  Shortness of breath since yesterday. Smoker. History of left lung cancer. EXAM: CHEST  2 VIEW COMPARISON:  Chest CT dated 04/28/2017. FINDINGS: Stable median sternotomy wires and post left upper lobectomy changes. The lungs remain hyperexpanded with mildly prominent interstitial markings. Small amount of linear scarring at the left lung base with little change.  Minimal thoracic spine degenerative changes. Cholecystectomy clips. IMPRESSION: No acute abnormality.  Stable mild changes of COPD. Electronically Signed   By: Claudie Revering M.D.   On: 07/10/2017 18:31   Ct Head Wo Contrast  Result Date: 07/11/2017 CLINICAL DATA:  Ataxia. Suspected stroke. History of high blood pressure. Shortness of breath for couple of days. EXAM: CT HEAD WITHOUT CONTRAST TECHNIQUE: Contiguous axial images were obtained from the base of the skull through the vertex without intravenous contrast. COMPARISON:  None. FINDINGS: Brain: Diffuse cerebral atrophy. Low-attenuation changes throughout the deep white matter bilaterally. This probably represents chronic small vessel ischemic change but acute focal ischemia cannot be excluded without comparison. No mass effect or midline shift. Ventricles are not dilated. No abnormal extra-axial fluid collections. Gray-white matter junctions are distinct. Basal cisterns are not effaced. No acute intracranial hemorrhage. Vascular: Vascular calcifications in the vertebrobasilar and internal carotid arteries. Skull: Normal. Negative for fracture or focal lesion. Sinuses/Orbits: No acute finding. Other: None. IMPRESSION: No definite acute intracranial abnormalities. Chronic atrophy and small vessel ischemic changes. Electronically Signed   By: Lucienne Capers M.D.   On: 07/11/2017 00:02      Procedures   ____________________________________________   INITIAL IMPRESSION / ASSESSMENT AND PLAN / ED COURSE  Pertinent labs & imaging results that were available during my care of the patient were reviewed by me and considered in my medical decision making (see chart for details).  66 year old female presenting with above stated history of physical exam. Patient's blood pressure now markedly improved status post amlodipine which was discontinued while the patient was hospitalized last. I advised patient to resume taking amlodipine and follow up with Dr.  Candiss Norse. Patient has no symptoms at present.      ____________________________________________  FINAL CLINICAL IMPRESSION(S) / ED DIAGNOSES  Final diagnoses:  Hypertension, unspecified type     MEDICATIONS GIVEN DURING THIS VISIT:  Medications - No data to display   NEW OUTPATIENT MEDICATIONS STARTED DURING THIS VISIT:  New Prescriptions   No medications on file    Modified Medications   No medications on file    Discontinued Medications   No medications on file     Note:  This document was prepared using Dragon voice recognition software and may include unintentional dictation errors.    Gregor Hams, MD 07/11/17 [phone]

## 2017-07-11 NOTE — ED Notes (Signed)
Pt updated on progression of ct results. Pt verbalizes understanding.

## 2017-07-11 NOTE — ED Notes (Signed)
Pt updated on delay and progression of ct results. Pt verbalizes understanding.

## 2017-07-14 ENCOUNTER — Telehealth: Payer: Self-pay | Admitting: Gastroenterology

## 2017-07-14 NOTE — Telephone Encounter (Signed)
Patient LVM to call her regarding her colonoscopy.

## 2017-07-14 NOTE — Telephone Encounter (Signed)
Patient called to find out how she is supposed to prep for her colonoscopy.  She has been advised that Dr. Marius Ditch and or myself will get in touch with her tomorrow after discussing with Endo how her prep will be done.  Thanks [employer]

## 2017-07-15 ENCOUNTER — Telehealth: Payer: Self-pay

## 2017-07-15 ENCOUNTER — Telehealth: Payer: Self-pay | Admitting: Internal Medicine

## 2017-07-15 ENCOUNTER — Telehealth: Payer: Self-pay | Admitting: *Deleted

## 2017-07-15 ENCOUNTER — Encounter: Payer: Self-pay | Admitting: Anesthesiology

## 2017-07-15 NOTE — Telephone Encounter (Signed)
Eliquis 5 mg tablet samples placed at front desk for pick up.

## 2017-07-15 NOTE — Telephone Encounter (Signed)
Contacted pt to let her know that upon reviewing her chart she has not been cleared by her cardiologist Dr. Saunders Revel to have her colonoscopy on 07/22/17 because she needs to be on Eliquis for 3 months which she has not been.  Her colonoscopy has been canceled with Dr. Marius Ditch and we will reschedule it after she has been cleared with her Cardiologist.  Patient said that she had been stopped the medication because she ran out of samples and could not afford it.  Ive asked her to contact Anderson Malta at Dr. Darnelle Bos office for samples today.  She said she would try to but has transportation problems as well.  Thanks [employer]

## 2017-07-15 NOTE — Telephone Encounter (Signed)
S/w patient. She has been out of Eliquis for several days. Daughter is coming to office tomorrow to pick up samples. Patient said her daughter also has the patient assistance application and is supposed to be working on that as well.  Advised it would be beneficial to her to complete the patient assistance application to aid in getting her medicine. She verbalized understanding.

## 2017-07-15 NOTE — Telephone Encounter (Signed)
Patient calling the office for samples of medication:   1.  What medication and dosage are you requesting samples for?  Eliquis 5 mg po BID  2.  Are you currently out of this medication? yes

## 2017-07-15 NOTE — Telephone Encounter (Signed)
Received incoming call from [place] at [place]. Patient is scheduled for a colonoscopy on 07/22/17; however, patient has not been cleared to stop Eliquis.  In speaking with patient, Sharyn Lull said patient wants to have the procedure because she's already off the medication. Patient thought if she was not taking it then she could go ahead and have the procedure if she wanted to. According to [facility], patient is out of the medication and did not complete patient assistance information at this time. Our records show patient is to continue 3 months of continuous anticoagulation before discontinuing for elective procedures per Dr. Rodena Medin will notify patient that she is to continue Eliquis.  I will reach out to patient as well to offer further assistance.

## 2017-07-16 ENCOUNTER — Other Ambulatory Visit: Payer: Self-pay | Admitting: *Deleted

## 2017-07-16 ENCOUNTER — Ambulatory Visit: Payer: Medicare Other | Admitting: Gastroenterology

## 2017-07-16 MED ORDER — APIXABAN 5 MG PO TABS: 5.0000 mg | ORAL_TABLET | Freq: Two times a day (BID) | ORAL | 3 refills | Status: DC

## 2017-07-16 NOTE — Telephone Encounter (Signed)
Patient daughter came by the office to pick up a replacement med assistance form.  She will return when patient info completed for md review and sign.

## 2017-07-17 ENCOUNTER — Ambulatory Visit: Payer: Medicare Other | Admitting: Gastroenterology

## 2017-07-22 ENCOUNTER — Ambulatory Visit: Admission: RE | Admit: 2017-07-22 | Payer: Medicare Other | Source: Ambulatory Visit | Admitting: Gastroenterology

## 2017-07-22 ENCOUNTER — Telehealth: Payer: Self-pay | Admitting: Internal Medicine

## 2017-07-22 HISTORY — DX: Diverticulitis of intestine, part unspecified, without perforation or abscess without bleeding: K57.92

## 2017-07-22 HISTORY — DX: Family history of other specified conditions: Z84.89

## 2017-07-22 SURGERY — COLONOSCOPY WITH PROPOFOL
Anesthesia: Choice

## 2017-07-22 NOTE — Telephone Encounter (Signed)
Dr. Saunders Revel completed his portion of [place] and I faxed it to the company. Notified patient that we had completed that portion. She has her portion complete as well. She will have her daughter bring it up here for [place] to fax. I encouraged her to do so as soon as possible.

## 2017-08-05 ENCOUNTER — Telehealth: Payer: Self-pay | Admitting: Internal Medicine

## 2017-08-05 NOTE — Telephone Encounter (Signed)
PT came by to drop off Patient Assistance Forms to be signed Placed on [employer]. desk

## 2017-08-05 NOTE — Telephone Encounter (Signed)
Please place information in chart for what you gave samples for.

## 2017-08-05 NOTE — Telephone Encounter (Signed)
Patient Carly Khan coming by to ask for samples and drop off Patient Assistance Forms Waiting in [place]   1.  What medication and dosage are you requesting samples for? ELIQUIS 5 mg taken 2 times daily  2.  Are you currently out of this medication? Yes

## 2017-08-05 NOTE — Telephone Encounter (Signed)
Patient's caregiver here to drop off Eliquis Patient Assistance with completion of patient's portion. Will fax these papers along with physician portion again. She also asked for samples for patient. One box of Eliquis samples provided.  Drug name: Eliquis       Strength: 5 mg        Qty: 1 box  LOT: [id number]  Exp.Date: 12/20

## 2017-08-05 NOTE — Telephone Encounter (Signed)
Patient's caregiver here to drop off Eliquis Patient Assistance with completion of patient's portion. Will fax these papers along with physician portion again. She also asked for samples for patient. One box of Eliquis samples provided.  Drug name: Eliquis       Strength: 5 mg        Qty: 1 box  LOT: [id number]  Exp.Date: 12/20

## 2017-08-12 ENCOUNTER — Telehealth: Payer: Self-pay | Admitting: *Deleted

## 2017-08-12 NOTE — Telephone Encounter (Signed)
Patient's caregiver came into office today to get samples of Eliquis if available.  Provided her with application to Medication Management as well the number to the [employer] foundation. Patient was denied for patient assistance.  Medication Samples have been provided to the patient.  Drug name: Eliquis       Strength: 5 mg        Qty: 3 boxes  LOT: [id number]  Exp.Date: 11/2019

## 2017-08-12 NOTE — Telephone Encounter (Signed)
Called patient to let her know she was not eligible to receive Eliquis from patient assistance because "product covered by insurance." She said it would cost her $150 and she cannot afford that.  She said she will call Medication Management and [employer] foundation.  Meanwhile, she said she has been on Eliquis now for over 3 months and thought she would be able to stop it after that.  There were only 3 days she did not take the medication because she was out.  Patient started Eliquis on 04/28/17. Patient has f/u with Dr End on 09/23/17. Will route to Dr. Saunders Revel for advice as to whether patient needs to continue Eliquis.

## 2017-08-12 NOTE — Telephone Encounter (Signed)
She will need to be on lifelong anticoagulation given paroxysmal atrial fibrillation and history of left atrial thrombus. If apixaban is too expensive for her, she could check with her insurance to see if rivaroxaban is cheaper. If neither is an option, we will need to transition her to warfarin. Thanks.  [name], MD [facility] Pager: [phone]

## 2017-08-12 NOTE — Telephone Encounter (Signed)
Called patient. She verbalized understanding of the need to stay on a blood thinner longterm. She is going to call Medication management as well as the PAN foundation. She will check with her insurance company regarding the cost of Xarelto and coumadin and call us with an update.

## 2017-08-30 DIAGNOSIS — R7303 Prediabetes: Secondary | ICD-10-CM | POA: Insufficient documentation

## 2017-08-30 DIAGNOSIS — E7800 Pure hypercholesterolemia, unspecified: Secondary | ICD-10-CM | POA: Insufficient documentation

## 2017-09-03 NOTE — Telephone Encounter (Signed)
No answer. Left message to call back with an update on status of medication assistance progress.

## 2017-09-23 ENCOUNTER — Ambulatory Visit (INDEPENDENT_AMBULATORY_CARE_PROVIDER_SITE_OTHER): Payer: Medicare Other | Admitting: Internal Medicine

## 2017-09-23 ENCOUNTER — Encounter: Payer: Self-pay | Admitting: Internal Medicine

## 2017-09-23 VITALS — BP 150/98 | HR 94 | Ht 64.0 in | Wt 154.2 lb

## 2017-09-23 DIAGNOSIS — R0602 Shortness of breath: Secondary | ICD-10-CM | POA: Diagnosis not present

## 2017-09-23 DIAGNOSIS — I2510 Atherosclerotic heart disease of native coronary artery without angina pectoris: Secondary | ICD-10-CM

## 2017-09-23 DIAGNOSIS — I10 Essential (primary) hypertension: Secondary | ICD-10-CM

## 2017-09-23 DIAGNOSIS — I513 Intracardiac thrombosis, not elsewhere classified: Secondary | ICD-10-CM

## 2017-09-23 DIAGNOSIS — I480 Paroxysmal atrial fibrillation: Secondary | ICD-10-CM

## 2017-09-23 MED ORDER — AMLODIPINE BESYLATE 5 MG PO TABS
5.0000 mg | ORAL_TABLET | Freq: Every day | ORAL | 3 refills | Status: DC
Start: 2017-09-23 — End: 2018-01-04

## 2017-09-23 NOTE — Telephone Encounter (Signed)
Patient had office visit with Dr End today. She was given information to contact Medication management to see if they can assist her. Samples of Eliquis were also provided.

## 2017-09-23 NOTE — Patient Instructions (Addendum)
Medication Instructions:  Your physician has recommended you make the following change in your medication:  1- INCREASE Amlodipine to 5 mg by mouth once a day.   Labwork: none  Testing/Procedures: none  Follow-Up: Your physician recommends that you schedule a follow-up appointment in: 3 MONTHS WITH DR END.     If you need a refill on your cardiac medications before your next appointment, please call your pharmacy.   Patient was also given Medication Management application and phone number to call and start the process.    Medication Samples have been provided to the patient.  Drug name: Eliquis       Strength: 5 mg        Qty: 4 boxes  LOT: [id number]  Exp.Date: 01/2020

## 2017-09-23 NOTE — Progress Notes (Signed)
Follow-up Outpatient Visit Date: 09/23/2017  Primary Care Provider: [facility], [address] [place] [address]  Chief Complaint: Follow-up PAF and LA thrombus  HPI:  Carly Khan is a 66 y.o. 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  I last saw her in August, at which time she noted slightly increased dyspnea on exertion in the setting of uncontrolled hypertension.  We agreed to increase metoprolol and obtain pharmacologic myocardial perfusion stress test, which was low risk with a normal EF.  She was seen in early September in the emergency department due to systolic blood pressures at home exceeding 200 mmHg.  She is continued to follow with her PCP, Dr. Candiss Norse, for management of hypertension.  Today, Carly Khan reports feeling fairly well. She has stable chronic exertional dyspnea. She denies chest pain, palpitations, and lightheadedness. She noted some bilateral leg edema this summer, though this has since resolved. Unfortunately, she continues to smoke.  Carly Khan has not taken apixaban for at least 2-4 weeks. She noted some bleeding around her ostomy with bag changes, and therefore stopped apixaban. Mild bleeding at the site has persisted with bag changes. She otherwise has not had any significant bleeding and was tolerating apixaban well. Cost was also an issue; she was receiving samples for our office, as she did not qualify for patient assistance as the medication is covered by her insurance (albeit with a significant copay).  --------------------------------------------------------------------------------------------------  Cardiovascular History &  Procedures: Cardiovascular Problems:  Left atrial appendage thrombus  Coronary artery disease status post CABG  Paroxysmal atrial fibrillation  Risk Factors:  Known coronary artery disease, hypertension, sedentary lifestyle, tobacco use, and age greater than 28  Cath/PCI:  Not available  CV Surgery:  CABG (1991, [place]): Details unknown  EP Procedures and Devices:  30 day event monitor (05/07/17): Predominantly sinus rhythm with PACs and PVCs. Paroxysmal atrial fibrillation with rapid ventricular response noted (a-fib burden less than 1% but may be underestimated).  Non-Invasive Evaluation(s):  Pharmacologic MPI (06/24/17): Low risk study without significant ischemia.  LVEF 72%.  TEE (05/04/17): Normal LV function (EF 60-65%) with normal wall motion. No left atrial or appendage thrombus. No right atrial or appendage thrombus. Lipomatous hypertrophy of the intra-atrial septum without atrial level shunt.  TTE (04/29/17): Normal LV size with moderate LVH. LVEF 65-70% with grade 1 diastolic dysfunction and elevated filling pressures. Mildly thickened aortic and mitral valve leaflets. Lipomatous hypertrophy of the interatrial septum. Mildly dilated pulmonary arteries. Normal RV size and function.  Recent CV Pertinent Labs: Lab Results  Component Value Date   INR 1.30 04/29/2017   BNP 46.0 07/10/2017   K 4.2 07/10/2017   MG 2.7 (H) 04/29/2017   BUN 23 (H) 07/10/2017   CREATININE 1.19 (H) 07/10/2017    Past medical and surgical history were reviewed and updated in EPIC.  Current Meds  Medication Sig  . albuterol (PROVENTIL HFA;VENTOLIN HFA) 108 (90 Base) MCG/ACT inhaler Inhale 2 puffs into the lungs every 6 (six) hours as needed.  Marland Kitchen amLODipine (NORVASC) 2.5 MG tablet Take 2.5 mg by mouth daily.  Marland Kitchen aspirin EC 81 MG tablet Take 1 tablet by mouth daily.  . metoprolol succinate (TOPROL-XL) 25 MG 24 hr tablet Take 1 tablet (25 mg  total) by mouth 2 (two) times daily.  .  pravastatin (PRAVACHOL) 10 MG tablet Take 10 mg by mouth at bedtime.    Allergies: Penicillin g and Succinylcholine  Social History   Socioeconomic History  . Marital status: Divorced    Spouse name: Not on file  . Number of children: Not on file  . Years of education: Not on file  . Highest education level: Not on file  Social Needs  . Financial resource strain: Not on file  . Food insecurity - worry: Not on file  . Food insecurity - inability: Not on file  . Transportation needs - medical: Not on file  . Transportation needs - non-medical: Not on file  Occupational History  . Not on file  Tobacco Use  . Smoking status: Current Every Day Smoker    Packs/day: 0.50    Years: 40.00    Pack years: 20.00  . Smokeless tobacco: Never Used  . Tobacco comment: denied smoking info  Substance and Sexual Activity  . Alcohol use: No  . Drug use: No  . Sexual activity: No  Other Topics Concern  . Not on file  Social History Narrative  . Not on file    Family History  Problem Relation Age of Onset  . Cancer Mother   . Stomach cancer Mother   . Heart disease Father   . Hypertension Father   . Parkinson's disease Sister   . Cervical cancer Sister     Review of Systems: A 12-system review of systems was performed and was negative except as noted in the HPI.  --------------------------------------------------------------------------------------------------  Physical Exam: BP (!) 150/98 (BP Location: Left Arm, Patient Position: Sitting, Cuff Size: Normal)   Pulse 94   Ht 5\' 4"  (1.626 m)   Wt 154 lb 4 oz (70 kg)   BMI 26.48 kg/m   General:  Overweight woman, seated comfortably in the exam room. HEENT: No conjunctival pallor or scleral icterus. Moist mucous membranes.  OP clear. Neck: Supple without lymphadenopathy, thyromegaly, JVD, or HJR. No carotid bruit. Lungs: Normal work of breathing. Clear to auscultation bilaterally without wheezes or crackles. Heart: Regular rate  and rhythm without murmurs, rubs, or gallops. Non-displaced PMI. Abd: Bowel sounds present. Soft, NT/ND without hepatosplenomegaly Ext: Trace ankle edema. Skin: Warm and dry without rash.  EKG:  NSR, LA enlargement, anterolateral and inferior ST/T changes. No significant change from prior tracing on 07/11/17.  Lab Results  Component Value Date   WBC 10.0 07/10/2017   HGB 15.3 07/10/2017   HCT 43.1 07/10/2017   MCV 95.3 07/10/2017   PLT 235 07/10/2017    Lab Results  Component Value Date   NA 136 07/10/2017   K 4.2 07/10/2017   CL 105 07/10/2017   CO2 22 07/10/2017   BUN 23 (H) 07/10/2017   CREATININE 1.19 (H) 07/10/2017   GLUCOSE 107 (H) 07/10/2017    No results found for: CHOL, HDL, LDLCALC, LDLDIRECT, TRIG, CHOLHDL  --------------------------------------------------------------------------------------------------  ASSESSMENT AND PLAN: Paroxysmal atrial fibrillation and history of left atrial thrombus No symptoms to suggest recurrence of atrial fibrillation, though monitor previously confirmed PAF.  Given documented left atrial thrombus and a CHADSVASC score of at least 4, I have recommended that Carly Khan restart anticoagulation as soon as possible with plan for indefinite anticoagulation.  We have provided her with samples for apixaban today as well as information for reapplying for medication assistance.  If apixaban remains cost prohibitive, we will need to consider switching her to  warfarin.  Coronary artery disease without angina, shortness of breath Symptoms are unchanged.  EKG is stable though abnormal.  Myocardial perfusion stress test after our last visit was a low risk.  I have encouraged Carly Khan to stop smoking, as this is likely contributing to her symptoms.  No further workup at this time.  We will continue with metoprolol succinate, aspirin, and pravastatin for primary prevention.  If she is able to reliable stay on anticoagulation, we could consider stopping aspirin  to minimize risk for bleeding.  Hypertension Blood pressure is mild to moderately elevated today, albeit better than at prior visits.  We have agreed to increase amlodipine to 5 mg daily.  I will defer further management to Dr. Candiss Norse.  Follow-up: Return to clinic in 3 months.  [name], MD 09/23/2017 11:35 AM

## 2017-09-24 ENCOUNTER — Encounter: Payer: Self-pay | Admitting: Internal Medicine

## 2017-11-04 ENCOUNTER — Inpatient Hospital Stay: Payer: Medicare Other | Attending: Oncology

## 2017-11-05 ENCOUNTER — Other Ambulatory Visit: Payer: Medicare Other

## 2017-11-06 ENCOUNTER — Ambulatory Visit: Payer: Medicare Other

## 2017-11-11 ENCOUNTER — Ambulatory Visit: Payer: Medicare Other

## 2017-11-12 ENCOUNTER — Inpatient Hospital Stay: Payer: Medicare Other | Admitting: Oncology

## 2017-11-18 ENCOUNTER — Ambulatory Visit: Admission: RE | Admit: 2017-11-18 | Payer: Medicare Other | Source: Ambulatory Visit

## 2017-11-18 ENCOUNTER — Ambulatory Visit
Admission: RE | Admit: 2017-11-18 | Discharge: 2017-11-18 | Disposition: A | Discharge status: Home / Self Care | Payer: Medicare Other | Source: Ambulatory Visit | Attending: Oncology | Admitting: Oncology

## 2017-11-18 DIAGNOSIS — J984 Other disorders of lung: Secondary | ICD-10-CM | POA: Diagnosis not present

## 2017-11-18 DIAGNOSIS — E882 Lipomatosis, not elsewhere classified: Secondary | ICD-10-CM | POA: Diagnosis not present

## 2017-11-18 DIAGNOSIS — I253 Aneurysm of heart: Secondary | ICD-10-CM | POA: Insufficient documentation

## 2017-11-18 DIAGNOSIS — C3492 Malignant neoplasm of unspecified part of left bronchus or lung: Secondary | ICD-10-CM

## 2017-11-18 DIAGNOSIS — I517 Cardiomegaly: Secondary | ICD-10-CM | POA: Insufficient documentation

## 2017-11-18 DIAGNOSIS — I700 Atherosclerosis of aorta: Secondary | ICD-10-CM | POA: Insufficient documentation

## 2017-11-18 DIAGNOSIS — J439 Emphysema, unspecified: Secondary | ICD-10-CM | POA: Insufficient documentation

## 2017-11-18 LAB — POCT I-STAT CREATININE: Creatinine, Ser: 1.1 mg/dL — ABNORMAL HIGH (ref 0.44–1.00)

## 2017-11-18 MED ORDER — IOPAMIDOL (ISOVUE-300) INJECTION 61%: 75.0000 mL | Freq: Once | INTRAVENOUS | Status: DC | PRN

## 2017-11-25 DIAGNOSIS — J449 Chronic obstructive pulmonary disease, unspecified: Secondary | ICD-10-CM | POA: Insufficient documentation

## 2017-11-25 DIAGNOSIS — Z85118 Personal history of other malignant neoplasm of bronchus and lung: Secondary | ICD-10-CM | POA: Insufficient documentation

## 2017-11-25 DIAGNOSIS — F17200 Nicotine dependence, unspecified, uncomplicated: Secondary | ICD-10-CM | POA: Insufficient documentation

## 2017-11-26 ENCOUNTER — Inpatient Hospital Stay: Payer: Medicare Other | Admitting: Oncology

## 2017-11-26 ENCOUNTER — Inpatient Hospital Stay: Payer: Medicare Other

## 2017-11-26 ENCOUNTER — Telehealth: Payer: Self-pay | Admitting: Oncology

## 2017-12-08 NOTE — Progress Notes (Deleted)
Hematology/Oncology Follow up note [facility] Telephone:([phone] Fax:([phone]) [phone]  Patient Care Team: Glendon Axe, MD as PCP - General (Internal Medicine)   Name of the patient: Carly Khan  [id number]  05-25-1951    Reason for referral- h/o stage I lung cancer   Referring physician- Dr. Adonis Huguenin  Date of visit: 12/08/17   History of presenting illness- 1. Patient is a 67 year old Caucasian female who had diverticulosis, by perforation/colovaginal fistula required electively and this sample repair in January 2017. She has an ileostomy in place which was supposed to be reversed while she was in [place]. She has now moved to [facility] and Dr. Adonis Huguenin plans to do the reversal procedure  2 patient also has a history of age 71 lung cancer which was diagnosed in August 2017 chest x-ray that was done as a part of the workup for colovaginal fistula showed a 4.4 x 3.0 with masslike consolidation in the left upper lobe. This was initially seen on the CT chest in November 2016 and the mass grew to 4.5 x 3.5 x 3.8 cm in June 2017. Patient had a CT-guided biopsy of the mass in August 2017 and was found to be TTF-1 positive adenocarcinoma. Predominant histology was papillary adenocarcinoma with additional micropapillary and lipid pattern bronchial and vascular margins were involved and there was no pleural invasion  3. Patient was seen by Dr Jasmine Pang who discussed questionable role for adjuvant chemotherapy given stage IA disease [id number]. She opted for watchful surveillance   4. She did have a recent CT chest on 11/18/2017 which showed: 1. No CT findings for recurrent tumor or metastatic disease. 2. Stable emphysematous changes and pulmonary scarring and stable left upper lobe lobectomy changes. 3. Surgical changes from bypass surgery. 4. Lipomatosis hypertrophy of the interatrial septum. 5. Left ventricular apical aneurysm. 6. Aortic Atherosclerosis (ICD10-I70.0) and  Emphysema (ICD10-J43.9).    ECOG PS- 2 Pain scale- 0  Review of systems- Review of Systems  Constitutional: Negative for chills and fever.  HENT: Negative for ear pain.   Eyes: Negative for pain.  Respiratory: Negative for cough and sputum production.   Cardiovascular: Negative for palpitations.  Gastrointestinal: Negative for nausea and vomiting.  Genitourinary: Negative for dysuria.  Musculoskeletal: Negative for myalgias.  Skin: Negative for rash.  Neurological: Negative for dizziness and sensory change.  Endo/Heme/Allergies: Does not bruise/bleed easily.  Psychiatric/Behavioral: Negative for depression.    Allergies  Allergen Reactions  . Penicillin G Hives and Itching  . Succinylcholine     Patient's daughter had emergency surgery and was awake for the entire procedure, was told she should not have succ's and neither should her mom     Patient Active Problem List   Diagnosis Date Noted  . Hepatic cirrhosis ([place]) 07/01/2017  . Shortness of breath 06/17/2017  . Paroxysmal atrial fibrillation ([place]) 06/17/2017  . Thrombus of left atrial appendage without antecedent myocardial infarction 06/17/2017  . Incisional hernia, without obstruction or gangrene 05/27/2017  . 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 Thrombus in heart chamber 05/17/2017  . Hyperkalemia   . Acute renal failure ([place])   . CKD (chronic kidney disease) stage 3, GFR 30-59 ml/min (HCC) 04/09/2017  . Essential hypertension 04/09/2017  . Intermittent vertigo 04/09/2017  . CAD in native artery 02/28/2017  . 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 History of sebaceous cyst 02/28/2017  . Itching 02/28/2017  . 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  Lung nodule 06/25/2016  . Adenocarcinoma of left lung ([place]) 06/19/2016  . Primary cancer of left upper lobe of lung ([place]) 06/19/2016  . AKI (acute kidney injury) ([place]) 12/29/2015  . 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 CAD in native artery    a. s/p CABG in 1991 in [place].  Marland Kitchen Cancer of left lung ([place]) 2016   LUL lung resection  . CKD (chronic kidney disease) stage 3, GFR 30-59 ml/min ([place]) 04/09/2017   a. 04/2017 ARF & Hyperkalemia req HD x 1.  . 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 Diverticulitis   . Essential hypertension 04/09/2017  . Family history of adverse reaction to anesthesia   . History of sebaceous cyst 02/28/2017  . Intermittent vertigo 04/09/2017  . Itching 02/28/2017  . Left Atrial Appendage Thrombus    a. Incidentally noted on CT 04/2017;  b. 04/2017 Echo: EF 65-70%, Gr1 DD, no LA filling defect seen; c. 05/2017 TEE: EF 60-65%, no RA/LA thrombus.  . S/P CABG (coronary artery bypass graft) 1991     Past Surgical History:  Procedure Laterality Date  . ABDOMINAL HYSTERECTOMY    . cardiac bypass  1990  . carpel tunnel    . CHOLECYSTECTOMY    . COLON SURGERY    . COLONOSCOPY    . CT guided needle placement    . EXPLORATORY LAPAROTOMY    . GALLBLADDER SURGERY    . loop ileostomy    . SIGMOID RESECTION / RECTOPEXY    . TEE WITHOUT CARDIOVERSION N/A 05/04/2017   Procedure: TRANSESOPHAGEAL ECHOCARDIOGRAM (TEE);  Surgeon: Wellington Hampshire, MD;  Location: [facility] ORS;  Service: Cardiovascular;  Laterality: N/A;  . THORACOSCOPY    . vaginectomy      Social History   Socioeconomic History  . Marital status: Divorced    Spouse name: Not on file  . Number of children: Not on file  . Years of education: Not on file  . Highest education level: Not on file  Social Needs  . Financial resource strain: Not on file  . Food insecurity - worry: Not on file  . Food insecurity - inability: Not on file  . Transportation needs - medical: Not on file  . Transportation needs - non-medical: Not on file  Occupational History  . Not on file  Tobacco Use  . Smoking status: Current Every Day Smoker    Packs/day:  0.50    Years: 40.00    Pack years: 20.00  . Smokeless tobacco: Never Used  . Tobacco comment: denied smoking info  Substance and Sexual Activity  . Alcohol use: No  . Drug use: No  . Sexual activity: No  Other Topics Concern  . Not on file  Social History Narrative  . Not on file     Family History  Problem Relation Age of Onset  . Cancer Mother   . Stomach cancer Mother   . Heart disease Father   . Hypertension Father   . Parkinson's disease Sister   . Cervical cancer Sister      Current Outpatient Medications:  .  albuterol (PROVENTIL HFA;VENTOLIN HFA) 108 (90 Base) MCG/ACT inhaler, Inhale 2 puffs into the lungs every 6 (six) hours as needed., Disp: , Rfl:  .  amLODipine (NORVASC) 5 MG tablet, Take 1 tablet (5 mg total) by mouth daily., Disp: 90 tablet, Rfl: 3 .  apixaban (ELIQUIS) 5 MG TABS tablet, Take 1  tablet (5 mg total) by mouth 2 (two) times daily. (Patient not taking: Reported on 09/23/2017), Disp: 180 tablet, Rfl: 3 .  aspirin EC 81 MG tablet, Take 1 tablet by mouth daily., Disp: , Rfl:  .  metoprolol succinate (TOPROL-XL) 25 MG 24 hr tablet, Take 1 tablet (25 mg total) by mouth 2 (two) times daily., Disp: 180 tablet, Rfl: 3 .  pravastatin (PRAVACHOL) 10 MG tablet, Take 10 mg by mouth at bedtime., Disp: , Rfl:    Physical exam:  There were no vitals filed for this visit. Physical Exam     CMP Latest Ref Rng & Units 11/18/2017  Glucose 65 - 99 mg/dL -  BUN 6 - 20 mg/dL -  Creatinine 0.44 - 1.00 mg/dL 1.10(H)  Sodium 135 - 145 mmol/L -  Potassium 3.5 - 5.1 mmol/L -  Chloride 101 - 111 mmol/L -  CO2 22 - 32 mmol/L -  Calcium 8.9 - 10.3 mg/dL -   CBC Latest Ref Rng & Units 07/10/2017  WBC 3.6 - 11.0 K/uL 10.0  Hemoglobin 12.0 - 16.0 g/dL 15.3  Hematocrit 35.0 - 47.0 % 43.1  Platelets 150 - 440 K/uL 235    No images are attached to the encounter.  Ct Chest W Contrast  Result Date: 11/18/2017 CLINICAL DATA:  Adenocarcinoma of the left lung. EXAM: CT  CHEST WITH CONTRAST TECHNIQUE: Multidetector CT imaging of the chest was performed during intravenous contrast administration. CONTRAST:  75 cc Isovue 300 COMPARISON:  CT scan 04/28/2017 FINDINGS: Cardiovascular: The heart is normal in size. No pericardial effusion. Stable lipomatosis hypertrophy of the intra-atrial septum. Stable left ventricular apical aneurysm likely due to prior infarction. Stable tortuosity and atherosclerotic calcifications involving the thoracic aorta. No dissection. The branch vessels are patent. Moderate atherosclerotic calcifications at the branch vessel ostia. Stable surgical changes from coronary artery bypass surgery. Mediastinum/Nodes: No mediastinal or hilar mass or lymphadenopathy. Small scattered lymph nodes are stable. The esophagus is grossly normal. Lungs/Pleura: Stable advanced emphysematous changes and pulmonary scarring. Status post left upper lobe lobectomy. No findings for recurrent tumor. No new pulmonary lesions or worrisome pulmonary nodules to suggest metastatic disease. No infiltrates or effusions. Upper Abdomen: No significant findings. Suspect cirrhosis. Slightly thickened adrenal glands. Status post cholecystectomy with mild common bile duct dilatation, stable. Musculoskeletal: No breast masses, supraclavicular or axillary adenopathy. Small scattered lymph nodes are noted. Stable multiple small thyroid gland nodules. No significant bony findings. IMPRESSION: 1. No CT findings for recurrent tumor or metastatic disease. 2. Stable emphysematous changes and pulmonary scarring and stable left upper lobe lobectomy changes. 3. Surgical changes from bypass surgery. 4. Lipomatosis hypertrophy of the interatrial septum. 5. Left ventricular apical aneurysm. 6. Aortic Atherosclerosis (ICD10-I70.0) and Emphysema (ICD10-J43.9). Electronically Signed   By: Marijo Sanes M.D.   On: 11/18/2017 15:32    Assessment and plan- Patient is a 67 y.o. female with prior h/o Lung cancer in  August 2017 Stage IA s/p resection  Discuss results of CT thorax with the patient which does not show any evidence of lung cancer recurrence. I will repeat another CT chest with contrast in 6 months time. She will get CBC and CMP a week prior to CT. She did have evidence of acute renal failure which has now resolved.    also discussed other findings on CT scan including incidentally noted cirrhosis of the liver which needs to be further evaluated by GI. I will therefore make a GI referral in 3-4 months  From an oncology  standpoint patient is okay to proceed with ileostomy reversal. Recent CBC showed mild normocytic anemia and mild thrombocytopenia with a platelet count of 138 which should not be a contraindication for surgery   Thank you for this kind referral and the opportunity to participate in the care of this patient   Visit Diagnosis No diagnosis found.  Dr. Randa Evens, MD, MPH [place] at [facility] Pager- [phone] 12/08/2017 8:00 PM

## 2017-12-09 ENCOUNTER — Inpatient Hospital Stay: Payer: Medicare Other | Admitting: Oncology

## 2017-12-09 ENCOUNTER — Inpatient Hospital Stay: Payer: Medicare Other | Attending: Oncology

## 2017-12-26 ENCOUNTER — Encounter: Payer: Self-pay | Admitting: Emergency Medicine

## 2017-12-26 ENCOUNTER — Emergency Department: Payer: Medicare Other

## 2017-12-26 ENCOUNTER — Other Ambulatory Visit: Payer: Self-pay

## 2017-12-26 ENCOUNTER — Emergency Department
Admission: EM | Admit: 2017-12-26 | Discharge: 2017-12-26 | Disposition: A | Discharge status: Home / Self Care | Payer: Medicare Other | Source: Home / Self Care | Attending: Emergency Medicine | Admitting: Emergency Medicine

## 2017-12-26 DIAGNOSIS — F17210 Nicotine dependence, cigarettes, uncomplicated: Secondary | ICD-10-CM

## 2017-12-26 DIAGNOSIS — I25810 Atherosclerosis of coronary artery bypass graft(s) without angina pectoris: Secondary | ICD-10-CM

## 2017-12-26 DIAGNOSIS — Z85118 Personal history of other malignant neoplasm of bronchus and lung: Secondary | ICD-10-CM

## 2017-12-26 DIAGNOSIS — I129 Hypertensive chronic kidney disease with stage 1 through stage 4 chronic kidney disease, or unspecified chronic kidney disease: Secondary | ICD-10-CM | POA: Insufficient documentation

## 2017-12-26 DIAGNOSIS — R1084 Generalized abdominal pain: Secondary | ICD-10-CM | POA: Insufficient documentation

## 2017-12-26 DIAGNOSIS — N183 Chronic kidney disease, stage 3 (moderate): Secondary | ICD-10-CM

## 2017-12-26 DIAGNOSIS — Z951 Presence of aortocoronary bypass graft: Secondary | ICD-10-CM | POA: Insufficient documentation

## 2017-12-26 DIAGNOSIS — K433 Parastomal hernia with obstruction, without gangrene: Secondary | ICD-10-CM | POA: Diagnosis not present

## 2017-12-26 DIAGNOSIS — R112 Nausea with vomiting, unspecified: Secondary | ICD-10-CM

## 2017-12-26 DIAGNOSIS — E860 Dehydration: Secondary | ICD-10-CM | POA: Diagnosis not present

## 2017-12-26 LAB — CBC WITH DIFFERENTIAL/PLATELET
BASOS ABS: 0.1 10*3/uL (ref 0–0.1)
BASOS PCT: 1 %
EOS ABS: 0.2 10*3/uL (ref 0–0.7)
Eosinophils Relative: 3 %
HCT: 38.8 % (ref 35.0–47.0)
HEMOGLOBIN: 13.4 g/dL (ref 12.0–16.0)
Lymphocytes Relative: 33 %
Lymphs Abs: 1.9 10*3/uL (ref 1.0–3.6)
MCH: 32.7 pg (ref 26.0–34.0)
MCHC: 34.6 g/dL (ref 32.0–36.0)
MCV: 94.6 fL (ref 80.0–100.0)
Monocytes Absolute: 0.6 10*3/uL (ref 0.2–0.9)
Monocytes Relative: 10 %
NEUTROS PCT: 53 %
Neutro Abs: 3.1 10*3/uL (ref 1.4–6.5)
Platelets: 222 10*3/uL (ref 150–440)
RBC: 4.1 MIL/uL (ref 3.80–5.20)
RDW: 12.3 % (ref 11.5–14.5)
WBC: 5.8 10*3/uL (ref 3.6–11.0)

## 2017-12-26 LAB — COMPREHENSIVE METABOLIC PANEL
ALBUMIN: 4.1 g/dL (ref 3.5–5.0)
ALK PHOS: 85 U/L (ref 38–126)
ALT: 13 U/L — AB (ref 14–54)
AST: 25 U/L (ref 15–41)
Anion gap: 9 (ref 5–15)
BUN: 22 mg/dL — ABNORMAL HIGH (ref 6–20)
CALCIUM: 9.1 mg/dL (ref 8.9–10.3)
CO2: 23 mmol/L (ref 22–32)
Chloride: 104 mmol/L (ref 101–111)
Creatinine, Ser: 1.25 mg/dL — ABNORMAL HIGH (ref 0.44–1.00)
GFR calc Af Amer: 50 mL/min — ABNORMAL LOW (ref >60)
GFR calc non Af Amer: 43 mL/min — ABNORMAL LOW (ref >60)
GLUCOSE: 130 mg/dL — AB (ref 65–99)
Potassium: 3.8 mmol/L (ref 3.5–5.1)
Sodium: 136 mmol/L (ref 135–145)
Total Bilirubin: 0.9 mg/dL (ref 0.3–1.2)
Total Protein: 8.1 g/dL (ref 6.5–8.1)

## 2017-12-26 LAB — TROPONIN I
TROPONIN I: 0.03 ng/mL — AB (ref <0.03)
Troponin I: 0.04 ng/mL (ref <0.03)

## 2017-12-26 LAB — LIPASE, BLOOD: Lipase: 39 U/L (ref 11–51)

## 2017-12-26 MED ORDER — ONDANSETRON HCL 4 MG/2ML IJ SOLN
4.0000 mg | Freq: Once | INTRAMUSCULAR | Status: AC
  Administered 2017-12-26: 4 mg via INTRAVENOUS
  Filled 2017-12-26: qty 2

## 2017-12-26 MED ORDER — ONDANSETRON 4 MG PO TBDP: 4.0000 mg | ORAL_TABLET | Freq: Three times a day (TID) | ORAL | 0 refills | Status: DC | PRN

## 2017-12-26 MED ORDER — MORPHINE SULFATE (PF) 4 MG/ML IV SOLN
4.0000 mg | Freq: Once | INTRAVENOUS | Status: AC
  Administered 2017-12-26: 4 mg via INTRAVENOUS
  Filled 2017-12-26: qty 1

## 2017-12-26 NOTE — ED Notes (Signed)
Date and time results received: 12/26/17 1950  Test: Troponin Critical Value: 0.04  Name of Provider Notified: Jimmye Norman  Orders Received? Or Actions Taken?: None at this time

## 2017-12-26 NOTE — ED Triage Notes (Signed)
Pt arrived via POV from home with reports abdominal pain and pain around stoma from colostomy.  Pt also reports vomiting that started around 1700 today.  Pt c/o RUQ abdominal pain. Pt has hernia also.  No vomiitng with EMS.

## 2017-12-26 NOTE — ED Notes (Signed)
Patient taken out to lobby by this RN, patient will wait for taxi for ride home.

## 2017-12-26 NOTE — ED Provider Notes (Signed)
[facility] Emergency Department Provider Note       Time seen: ----------------------------------------- 6:41 PM on 12/26/2017 -----------------------------------------   I have reviewed the triage vital signs and the nursing notes.  HISTORY   Chief Complaint Emesis and Abdominal Pain    HPI Carly Khan is a 67 y.o. female with a history of coronary artery disease, left lung cancer, chronic kidney disease, diverticulitis, and diverting colostomy who presents to the ED for abdominal pain and pain around the stoma from her colostomy.  Patient states it has been in place for the past year.  She also reports vomiting that started today around 5 PM.  She has had profuse vomiting and also has some upper abdominal pain.  She reports a history of a ventral hernia and this has been uncomfortable.  Past Medical History:  Diagnosis Date  . CAD in native artery    a. s/p CABG in 1991 in [place].  Marland Kitchen Cancer of left lung ([place]) 2016   LUL lung resection  . CKD (chronic kidney disease) stage 3, GFR 30-59 ml/min ([place]) 04/09/2017   a. 04/2017 ARF & Hyperkalemia req HD x 1.  . 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 Diverticulitis   . Essential hypertension 04/09/2017  . Family history of adverse reaction to anesthesia   . History of sebaceous cyst 02/28/2017  . Intermittent vertigo 04/09/2017  . Itching 02/28/2017  . Left Atrial Appendage Thrombus    a. Incidentally noted on CT 04/2017;  b. 04/2017 Echo: EF 65-70%, Gr1 DD, no LA filling defect seen; c. 05/2017 TEE: EF 60-65%, no RA/LA thrombus.  . S/P CABG (coronary artery bypass graft) 1991    Patient Active Problem List   Diagnosis Date Noted  . Hepatic cirrhosis ([place]) 07/01/2017  . Shortness of breath 06/17/2017  . Paroxysmal atrial fibrillation ([place]) 06/17/2017  . Thrombus of left atrial appendage without antecedent myocardial infarction 06/17/2017  . Incisional hernia, without obstruction or gangrene 05/27/2017  . Parastomal hernia  without obstruction or gangrene 05/27/2017  . Thrombus in heart chamber 05/17/2017  . Hyperkalemia   . Acute renal failure ([place])   . CKD (chronic kidney disease) stage 3, GFR 30-59 ml/min (HCC) 04/09/2017  . Essential hypertension 04/09/2017  . Intermittent vertigo 04/09/2017  . CAD in native artery 02/28/2017  . 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 History of sebaceous cyst 02/28/2017  . Itching 02/28/2017  . 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 Lung nodule 06/25/2016  . Adenocarcinoma of left lung ([place]) 06/19/2016  . Primary cancer of left upper lobe of lung ([place]) 06/19/2016  . AKI (acute kidney injury) ([place]) 12/29/2015  . 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 ABDOMINAL HYSTERECTOMY    . cardiac bypass  1990  . carpel tunnel    . CHOLECYSTECTOMY    . COLON SURGERY    . COLONOSCOPY    . CT guided needle placement    . EXPLORATORY LAPAROTOMY    . GALLBLADDER SURGERY    . loop ileostomy    . SIGMOID RESECTION / RECTOPEXY    . TEE WITHOUT CARDIOVERSION N/A 05/04/2017   Procedure: TRANSESOPHAGEAL ECHOCARDIOGRAM (TEE);  Surgeon: Wellington Hampshire, MD;  Location: [facility] ORS;  Service: Cardiovascular;  Laterality: N/A;  . THORACOSCOPY    . vaginectomy      Allergies Penicillin g and Succinylcholine  Social History Social History  Tobacco Use  . Smoking status: Current Every Day Smoker    Packs/day: 0.50    Years: 40.00    Pack years: 20.00  . Smokeless tobacco: Never Used  . Tobacco comment: denied smoking info  Substance Use Topics  . Alcohol use: No  . Drug use: No    Review of Systems Constitutional: Negative for fever. Cardiovascular: Negative for chest pain. Respiratory: Negative for shortness of breath. Gastrointestinal: Positive for abdominal pain and vomiting Genitourinary: Negative for dysuria. Musculoskeletal: Negative  for back pain. Skin: Negative for rash. Neurological: Negative for headaches, focal weakness or numbness.  All systems negative/normal/unremarkable except as stated in the HPI  ____________________________________________   PHYSICAL EXAM:  VITAL SIGNS: ED Triage Vitals  Enc Vitals Group     BP      Pulse      Resp      Temp      Temp src      SpO2      Weight      Height      Head Circumference      Peak Flow      Pain Score      Pain Loc      Pain Edu?      Excl. in [place]?     Constitutional: Alert and oriented. Well appearing and in no distress. Eyes: Conjunctivae are normal. Normal extraocular movements. ENT   Head: Normocephalic and atraumatic.   Nose: No congestion/rhinnorhea.   Mouth/Throat: Mucous membranes are moist.   Neck: No stridor. Cardiovascular: Normal rate, regular rhythm. No murmurs, rubs, or gallops. Respiratory: Normal respiratory effort without tachypnea nor retractions. Breath sounds are clear and equal bilaterally. No wheezes/rales/rhonchi. Gastrointestinal: Diffuse abdominal tenderness, no rebound or guarding.  Normal bowel sounds.  Normal ostomy output is noted Musculoskeletal: Nontender with normal range of motion in extremities. No lower extremity tenderness nor edema. Neurologic:  Normal speech and language. No gross focal neurologic deficits are appreciated.  Skin:  Skin is warm, dry and intact. No rash noted. Psychiatric: Mood and affect are normal. Speech and behavior are normal.  ____________________________________________  ED COURSE:  As part of my medical decision making, I reviewed the following data within the [place] History obtained from family if available, nursing notes, old chart and ekg, as well as notes from prior ED visits. Patient presented for abdominal pain and vomiting, we will assess with labs and imaging as indicated at this time.   Procedures ____________________________________________    LABS (pertinent positives/negatives)  Labs Reviewed  COMPREHENSIVE METABOLIC PANEL - Abnormal; Notable for the following components:      Result Value   Glucose, Bld 130 (*)    BUN 22 (*)    Creatinine, Ser 1.25 (*)    ALT 13 (*)    GFR calc non Af Amer 43 (*)    GFR calc Af Amer 50 (*)    All other components within normal limits  TROPONIN I - Abnormal; Notable for the following components:   Troponin I 0.04 (*)    All other components within normal limits  TROPONIN I - Abnormal; Notable for the following components:   Troponin I 0.03 (*)    All other components within normal limits  CBC WITH DIFFERENTIAL/PLATELET  LIPASE, BLOOD  URINALYSIS, COMPLETE (UACMP) WITH MICROSCOPIC   EKG: Interpreted by me, sinus rhythm rate 81 bpm, possible anterior infarct that is old, long QT, normal axis.  RADIOLOGY Images were viewed by me  Abdomen 2 view  IMPRESSION: No free intraperitoneal air or evidence of small-bowel obstruction. ____________________________________________  DIFFERENTIAL DIAGNOSIS   Gastroenteritis, dehydration, electrolyte abnormality, hernia, cholecystitis, biliary colic, bowel obstruction  FINAL ASSESSMENT AND PLAN  Vomiting, abdominal pain   Plan: Patient had presented for abdominal pain and vomiting. Patient's labs were unremarkable except for mildly elevated troponin which was trending downward. Patient's imaging did not reveal any acute process.  Currently she feels better and has no further abdominal pain or vomiting.  She will be discharged with antiemetics and close outpatient follow-up for recheck.   Laurence Aly, MD   Note: This note was generated in part or whole with voice recognition software. Voice recognition is usually quite accurate but there are transcription errors that can and very often do occur. I apologize for any typographical errors that were not detected and corrected.     Earleen Newport, MD 12/26/17 2223

## 2017-12-28 ENCOUNTER — Emergency Department: Payer: Medicare Other

## 2017-12-28 ENCOUNTER — Inpatient Hospital Stay
Admission: EM | Admit: 2017-12-28 | Discharge: 2018-01-04 | DRG: 394 | Disposition: A | Discharge status: Home Health | Payer: Medicare Other | Attending: Internal Medicine | Admitting: Internal Medicine

## 2017-12-28 ENCOUNTER — Other Ambulatory Visit: Payer: Self-pay

## 2017-12-28 DIAGNOSIS — I10 Essential (primary) hypertension: Secondary | ICD-10-CM | POA: Diagnosis present

## 2017-12-28 DIAGNOSIS — Z7982 Long term (current) use of aspirin: Secondary | ICD-10-CM

## 2017-12-28 DIAGNOSIS — E785 Hyperlipidemia, unspecified: Secondary | ICD-10-CM | POA: Diagnosis present

## 2017-12-28 DIAGNOSIS — B9620 Unspecified Escherichia coli [E. coli] as the cause of diseases classified elsewhere: Secondary | ICD-10-CM | POA: Diagnosis present

## 2017-12-28 DIAGNOSIS — N289 Disorder of kidney and ureter, unspecified: Secondary | ICD-10-CM

## 2017-12-28 DIAGNOSIS — Z800 Family history of malignant neoplasm of digestive organs: Secondary | ICD-10-CM

## 2017-12-28 DIAGNOSIS — N179 Acute kidney failure, unspecified: Secondary | ICD-10-CM | POA: Diagnosis present

## 2017-12-28 DIAGNOSIS — E876 Hypokalemia: Secondary | ICD-10-CM | POA: Diagnosis present

## 2017-12-28 DIAGNOSIS — Z932 Ileostomy status: Secondary | ICD-10-CM

## 2017-12-28 DIAGNOSIS — Z902 Acquired absence of lung [part of]: Secondary | ICD-10-CM

## 2017-12-28 DIAGNOSIS — Z820 Family history of epilepsy and other diseases of the nervous system: Secondary | ICD-10-CM

## 2017-12-28 DIAGNOSIS — Z951 Presence of aortocoronary bypass graft: Secondary | ICD-10-CM

## 2017-12-28 DIAGNOSIS — F17210 Nicotine dependence, cigarettes, uncomplicated: Secondary | ICD-10-CM | POA: Diagnosis present

## 2017-12-28 DIAGNOSIS — Z8249 Family history of ischemic heart disease and other diseases of the circulatory system: Secondary | ICD-10-CM

## 2017-12-28 DIAGNOSIS — I2510 Atherosclerotic heart disease of native coronary artery without angina pectoris: Secondary | ICD-10-CM | POA: Diagnosis present

## 2017-12-28 DIAGNOSIS — I480 Paroxysmal atrial fibrillation: Secondary | ICD-10-CM | POA: Diagnosis present

## 2017-12-28 DIAGNOSIS — Z91120 Patient's intentional underdosing of medication regimen due to financial hardship: Secondary | ICD-10-CM

## 2017-12-28 DIAGNOSIS — T45516A Underdosing of anticoagulants, initial encounter: Secondary | ICD-10-CM | POA: Diagnosis present

## 2017-12-28 DIAGNOSIS — K56609 Unspecified intestinal obstruction, unspecified as to partial versus complete obstruction: Secondary | ICD-10-CM

## 2017-12-28 DIAGNOSIS — Z4659 Encounter for fitting and adjustment of other gastrointestinal appliance and device: Secondary | ICD-10-CM

## 2017-12-28 DIAGNOSIS — K433 Parastomal hernia with obstruction, without gangrene: Principal | ICD-10-CM | POA: Diagnosis present

## 2017-12-28 DIAGNOSIS — Z8049 Family history of malignant neoplasm of other genital organs: Secondary | ICD-10-CM

## 2017-12-28 DIAGNOSIS — N390 Urinary tract infection, site not specified: Secondary | ICD-10-CM | POA: Diagnosis present

## 2017-12-28 DIAGNOSIS — E860 Dehydration: Secondary | ICD-10-CM | POA: Diagnosis present

## 2017-12-28 DIAGNOSIS — Z880 Allergy status to penicillin: Secondary | ICD-10-CM

## 2017-12-28 DIAGNOSIS — Z7951 Long term (current) use of inhaled steroids: Secondary | ICD-10-CM

## 2017-12-28 DIAGNOSIS — Z85118 Personal history of other malignant neoplasm of bronchus and lung: Secondary | ICD-10-CM

## 2017-12-28 DIAGNOSIS — K7460 Unspecified cirrhosis of liver: Secondary | ICD-10-CM | POA: Diagnosis present

## 2017-12-28 DIAGNOSIS — R112 Nausea with vomiting, unspecified: Secondary | ICD-10-CM | POA: Insufficient documentation

## 2017-12-28 HISTORY — DX: Other forms of dyspnea: R06.09

## 2017-12-28 HISTORY — DX: Paroxysmal atrial fibrillation: I48.0

## 2017-12-28 HISTORY — DX: Personal history of other medical treatment: Z92.89

## 2017-12-28 HISTORY — DX: Dyspnea, unspecified: R06.00

## 2017-12-28 LAB — URINALYSIS, COMPLETE (UACMP) WITH MICROSCOPIC
BILIRUBIN URINE: NEGATIVE
GLUCOSE, UA: NEGATIVE mg/dL
KETONES UR: NEGATIVE mg/dL
Nitrite: NEGATIVE
Protein, ur: 30 mg/dL — AB
Specific Gravity, Urine: 1.021 (ref 1.005–1.030)
pH: 5 (ref 5.0–8.0)

## 2017-12-28 LAB — CBC
HEMATOCRIT: 48 % — AB (ref 35.0–47.0)
Hemoglobin: 16.9 g/dL — ABNORMAL HIGH (ref 12.0–16.0)
MCH: 32.9 pg (ref 26.0–34.0)
MCHC: 35.3 g/dL (ref 32.0–36.0)
MCV: 93.4 fL (ref 80.0–100.0)
Platelets: 347 10*3/uL (ref 150–440)
RBC: 5.14 MIL/uL (ref 3.80–5.20)
RDW: 12.5 % (ref 11.5–14.5)
WBC: 8.9 10*3/uL (ref 3.6–11.0)

## 2017-12-28 LAB — COMPREHENSIVE METABOLIC PANEL
ALT: 24 U/L (ref 14–54)
AST: 37 U/L (ref 15–41)
Albumin: 5.1 g/dL — ABNORMAL HIGH (ref 3.5–5.0)
Alkaline Phosphatase: 87 U/L (ref 38–126)
Anion gap: 17 — ABNORMAL HIGH (ref 5–15)
BUN: 46 mg/dL — AB (ref 6–20)
CHLORIDE: 96 mmol/L — AB (ref 101–111)
CO2: 21 mmol/L — ABNORMAL LOW (ref 22–32)
Calcium: 10.1 mg/dL (ref 8.9–10.3)
Creatinine, Ser: 2.05 mg/dL — ABNORMAL HIGH (ref 0.44–1.00)
GFR calc Af Amer: 28 mL/min — ABNORMAL LOW (ref >60)
GFR calc non Af Amer: 24 mL/min — ABNORMAL LOW (ref >60)
GLUCOSE: 211 mg/dL — AB (ref 65–99)
POTASSIUM: 4.5 mmol/L (ref 3.5–5.1)
Sodium: 134 mmol/L — ABNORMAL LOW (ref 135–145)
Total Bilirubin: 2 mg/dL — ABNORMAL HIGH (ref 0.3–1.2)
Total Protein: 9.9 g/dL — ABNORMAL HIGH (ref 6.5–8.1)

## 2017-12-28 LAB — LIPASE, BLOOD: LIPASE: 28 U/L (ref 11–51)

## 2017-12-28 MED ORDER — SODIUM CHLORIDE 0.9 % IV BOLUS (SEPSIS)
1000.0000 mL | Freq: Once | INTRAVENOUS | Status: AC
  Administered 2017-12-28: 1000 mL via INTRAVENOUS

## 2017-12-28 MED ORDER — ONDANSETRON HCL 4 MG/2ML IJ SOLN
4.0000 mg | Freq: Once | INTRAMUSCULAR | Status: AC
  Administered 2017-12-28: 4 mg via INTRAVENOUS

## 2017-12-28 MED ORDER — LEVOFLOXACIN IN D5W 500 MG/100ML IV SOLN
500.0000 mg | Freq: Once | INTRAVENOUS | Status: AC
  Administered 2017-12-28: 500 mg via INTRAVENOUS
  Filled 2017-12-28 (×2): qty 100

## 2017-12-28 MED ORDER — ONDANSETRON HCL 4 MG/2ML IJ SOLN
INTRAMUSCULAR | Status: AC
  Filled 2017-12-28: qty 2

## 2017-12-28 MED ORDER — ONDANSETRON 4 MG PO TBDP
4.0000 mg | ORAL_TABLET | Freq: Once | ORAL | Status: AC
  Administered 2017-12-28: 4 mg via ORAL
  Filled 2017-12-28: qty 1

## 2017-12-28 NOTE — ED Provider Notes (Addendum)
[facility] Emergency Department Provider Note  Time seen: 9:44 PM  I have reviewed the triage vital signs and the nursing notes.   HISTORY  Chief Complaint Emesis and Nausea    HPI Carly Khan is a 67 y.o. female with a past medical history of CAD, diverticulitis status post colostomy, hypertension, on anticoagulation, presents to the emergency department for nausea and vomiting.  According to the patient for the past for 5 days she has been nauseated with intermittent vomiting.  Patient was seen in the emergency department 2 days ago for the same diagnosed with a likely viral illness.  Was discharged with Zofran but states she cannot afford to get it filled.  Started vomiting again today states came to the emergency department for evaluation.  Patient denies any abdominal pain except when she is coughing she has pain around her hernia site.  Patient has a colostomy states she continues to have output from the colostomy.  Denies any fever.  States a chronic cough.   Past Medical History:  Diagnosis Date  . CAD in native artery    a. s/p CABG in 1991 in [place].  Marland Kitchen Cancer of left lung ([place]) 2016   LUL lung resection  . CKD (chronic kidney disease) stage 3, GFR 30-59 ml/min ([place]) 04/09/2017   a. 04/2017 ARF & Hyperkalemia req HD x 1.  . 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 Diverticulitis   . Essential hypertension 04/09/2017  . Family history of adverse reaction to anesthesia   . History of sebaceous cyst 02/28/2017  . Intermittent vertigo 04/09/2017  . Itching 02/28/2017  . Left Atrial Appendage Thrombus    a. Incidentally noted on CT 04/2017;  b. 04/2017 Echo: EF 65-70%, Gr1 DD, no LA filling defect seen; c. 05/2017 TEE: EF 60-65%, no RA/LA thrombus.  . S/P CABG (coronary artery bypass graft) 1991    Patient Active Problem List   Diagnosis Date Noted  . Hepatic cirrhosis ([place]) 07/01/2017  . Shortness of breath 06/17/2017  . Paroxysmal atrial fibrillation ([place]) 06/17/2017  .  Thrombus of left atrial appendage without antecedent myocardial infarction 06/17/2017  . Incisional hernia, without obstruction or gangrene 05/27/2017  . 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 Thrombus in heart chamber 05/17/2017  . Hyperkalemia   . Acute renal failure ([place])   . CKD (chronic kidney disease) stage 3, GFR 30-59 ml/min (HCC) 04/09/2017  . Essential hypertension 04/09/2017  . Intermittent vertigo 04/09/2017  . CAD in native artery 02/28/2017  . 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 History of sebaceous cyst 02/28/2017  . Itching 02/28/2017  . 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 Lung nodule 06/25/2016  . Adenocarcinoma of left lung ([place]) 06/19/2016  . Primary cancer of left upper lobe of lung ([place]) 06/19/2016  . AKI (acute kidney injury) ([place]) 12/29/2015  . 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 ABDOMINAL HYSTERECTOMY    . cardiac bypass  1990  . carpel tunnel    . CHOLECYSTECTOMY    . COLON SURGERY    . COLONOSCOPY    . CT guided needle placement    . EXPLORATORY LAPAROTOMY    . GALLBLADDER SURGERY    . loop ileostomy    . SIGMOID RESECTION / RECTOPEXY    . TEE WITHOUT CARDIOVERSION N/A 05/04/2017   Procedure: TRANSESOPHAGEAL ECHOCARDIOGRAM (TEE);  Surgeon: Kathlyn Sacramento  A, MD;  Location: [facility] ORS;  Service: Cardiovascular;  Laterality: N/A;  . THORACOSCOPY    . vaginectomy      Prior to Admission medications   Medication Sig Start Date End Date Taking? Authorizing Provider  albuterol (PROVENTIL HFA;VENTOLIN HFA) 108 (90 Base) MCG/ACT inhaler Inhale 2 puffs into the lungs every 6 (six) hours as needed. 04/13/17 04/13/18  [provider]  amLODipine (NORVASC) 5 MG tablet Take 1 tablet (5 mg total) by mouth daily. 09/23/17 12/26/18  End, Harrell Gave, MD  apixaban (ELIQUIS) 5 MG TABS tablet Take 1  tablet (5 mg total) by mouth 2 (two) times daily. Patient not taking: Reported on 09/23/2017 07/16/17   End, Harrell Gave, MD  aspirin EC 81 MG tablet Take 1 tablet by mouth daily. 04/09/17 04/09/18  [provider]  BREO ELLIPTA 100-25 MCG/INH AEPB Inhale 1 puff into the lungs 2 (two) times daily.    [provider]  losartan (COZAAR) 50 MG tablet Take 50 mg by mouth daily.    [provider]  metoprolol succinate (TOPROL-XL) 25 MG 24 hr tablet Take 1 tablet (25 mg total) by mouth 2 (two) times daily. Patient taking differently: Take 25 mg by mouth daily.  06/17/17 06/17/18  End, Harrell Gave, MD  ondansetron (ZOFRAN ODT) 4 MG disintegrating tablet Take 1 tablet (4 mg total) by mouth every 8 (eight) hours as needed for nausea or vomiting. 12/26/17   Earleen Newport, MD  pravastatin (PRAVACHOL) 10 MG tablet Take 10 mg by mouth at bedtime.    [provider]    Allergies  Allergen Reactions  . Penicillin G Hives and Itching    Has patient had a PCN reaction causing immediate rash, facial/tongue/throat swelling, SOB or lightheadedness with hypotension: Yes Has patient had a PCN reaction causing severe rash involving mucus membranes or skin necrosis: No Has patient had a PCN reaction that required hospitalization: No Has patient had a PCN reaction occurring within the last 10 years: Yes If all of the above answers are "NO", then may proceed with Cephalosporin use.  . Succinylcholine     Patient's daughter had emergency surgery and was awake for the entire procedure, was told she should not have succ's and neither should her mom     Family History  Problem Relation Age of Onset  . Cancer Mother   . Stomach cancer Mother   . Heart disease Father   . Hypertension Father   . Parkinson's disease Sister   . Cervical cancer Sister     Social History Social History   Tobacco Use  . Smoking status: Current Every Day Smoker    Packs/day: 0.50    Years: 40.00     Pack years: 20.00  . Smokeless tobacco: Never Used  . Tobacco comment: denied smoking info  Substance Use Topics  . Alcohol use: No  . Drug use: No    Review of Systems Constitutional: Negative for fever. Eyes: Negative for visual complaints ENT: Negative for recent illness/congestion Cardiovascular: Negative for chest pain. Respiratory: Negative for shortness of breath.  Chronic cough Gastrointestinal: Intermittent abdominal pain with cough, none currently.  Positive for nausea vomiting.  Negative for change in colostomy output Genitourinary: Negative for dysuria Musculoskeletal: Negative for musculoskeletal complaints Skin: Negative for skin complaints  Neurological: Negative for headache All other ROS negative  ____________________________________________   PHYSICAL EXAM:  VITAL SIGNS: ED Triage Vitals  Enc Vitals Group     BP 12/28/17 2038 110/77  Pulse Rate 12/28/17 2038 (!) 112     Resp 12/28/17 2038 17     Temp 12/28/17 2038 98.2 F (36.8 C)     Temp Source 12/28/17 2038 Oral     SpO2 12/28/17 2038 96 %     Weight 12/28/17 2027 150 lb (68 kg)     Height 12/28/17 2027 5\' 4"  (1.626 m)     Head Circumference --      Peak Flow --      Pain Score 12/28/17 2027 6     Pain Loc --      Pain Edu? --      Excl. in [place]? --    Constitutional: Alert and oriented. Well appearing and in no distress. Eyes: Normal exam ENT   Head: Normocephalic and atraumatic.   Mouth/Throat: Mucous membranes are moist. Cardiovascular: Normal rate, regular rhythm.  Respiratory: Normal respiratory effort without tachypnea nor retractions. Breath sounds are clear.  No significant wheeze rales or rhonchi. Gastrointestinal: Soft and nontender. No distention.  Colostomy present.  Ventral/incisional hernia, nontender currently.  No hard mass palpated, no signs of incarceration or strangulation Musculoskeletal: Nontender with normal range of motion in all extremities.  Neurologic:   Normal speech and language. No gross focal neurologic deficits Skin:  Skin is warm, dry and intact.  Psychiatric: Mood and affect are normal.   ____________________________________________   INITIAL IMPRESSION / ASSESSMENT AND PLAN / ED COURSE  Pertinent labs & imaging results that were available during my care of the patient were reviewed by me and considered in my medical decision making (see chart for details).  Patient presents the emergency department for nausea vomiting ongoing over the past for 5 days.  Seen for the same several days ago discharged with Zofran but cannot afford to get it filled.  Differential would include viral illness, gastroenteritis, less likely bowel obstruction.  We will check labs, IV hydrate and treat with Zofran.  Patient had a workup performed 2 days ago which I reviewed including normal labs and an x-ray showing a nonspecific bowel gas pattern.  No clinical signs of obstruction.  Soft nontender abdomen.  The patient's labs have resulted showing renal insufficiency.  Creatinine has increased to now 2.05 from 1.22 days ago.  Anion gap of 17.  We will place an IV continue IV hydration.  Given the patient's chronic kidney disease and now worsening renal insufficiency with continued vomiting at home we will admit to the hospitalist service for continued hydration and monitoring.  Is not entirely clear why the patient is vomiting.  We will obtain a CT scan to rule out obstruction given her significant past surgical history.  Patient agreeable to this plan of care.  Urinalysis has resulted showing a significant urinary tract infection.  Possibly the cause of the patient's vomiting.  We will cover with IV Levaquin given her penicillin allergy.  I have sent a urine culture.  Patient has been admitted to the hospitalist service.  ____________________________________________   FINAL CLINICAL IMPRESSION(S) / ED DIAGNOSES  Emesis Urinary tract infection   Harvest Dark, MD 12/28/17 2206    Harvest Dark, MD 12/28/17 2219

## 2017-12-28 NOTE — ED Triage Notes (Addendum)
Pt arrives to ED via [facility] from home with c/o N/V x3 days. Pt states she was seen and treated for same of Saturday; d/x'd with GI virus, pt reports being given r/x for Zofran, was unable to afford to have r/x filled. Pt reports decrease in PO intake, and 5 episodes of emesis over the last 24 hrs; 1 episode of emesis observed in Triage. Pt denies SHOB, no c/o CP. Pt is A&O, in NAD; RR even, regular and unlabored.

## 2017-12-28 NOTE — H&P (Signed)
[place] [facility] [place] NAME: Carly Khan    MR#:  [id number]  DATE OF BIRTH:  03-Jun-1951  DATE OF ADMISSION:  12/28/2017  PRIMARY CARE PHYSICIAN: Glendon Axe, MD   REQUESTING/REFERRING PHYSICIAN: Kerman Passey, MD  CHIEF COMPLAINT:   Chief Complaint  Patient presents with  . Emesis  . Nausea    HISTORY OF PRESENT ILLNESS:  Carly Khan  is a 67 y.o. female who presents with several days of profuse nausea and vomiting.  She is also had abdominal pain during this time.  She came to the ED a few days ago and was treated and sent home.  She returns today with persistent nausea vomiting since that time.  On evaluation here she is found to have AK I today.  She is significantly dehydrated.  Her UA is positive for UTI.  Hospitalist were called for admission.  PAST MEDICAL HISTORY:   Past Medical History:  Diagnosis Date  . CAD in native artery    a. s/p CABG in 1991 in [place].  Marland Kitchen Cancer of left lung ([place]) 2016   LUL lung resection  . CKD (chronic kidney disease) stage 3, GFR 30-59 ml/min ([place]) 04/09/2017   a. 04/2017 ARF & Hyperkalemia req HD x 1.  . 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 Diverticulitis   . Essential hypertension 04/09/2017  . Family history of adverse reaction to anesthesia   . History of sebaceous cyst 02/28/2017  . Intermittent vertigo 04/09/2017  . Itching 02/28/2017  . Left Atrial Appendage Thrombus    a. Incidentally noted on CT 04/2017;  b. 04/2017 Echo: EF 65-70%, Gr1 DD, no LA filling defect seen; c. 05/2017 TEE: EF 60-65%, no RA/LA thrombus.  . S/P CABG (coronary artery bypass graft) 1991    PAST SURGICAL HISTORY:   Past Surgical History:  Procedure Laterality Date  . ABDOMINAL HYSTERECTOMY    . cardiac bypass  1990  . carpel tunnel    . CHOLECYSTECTOMY    . COLON SURGERY    . COLONOSCOPY    . CT guided needle placement    . EXPLORATORY LAPAROTOMY    . GALLBLADDER SURGERY    . loop ileostomy    . SIGMOID RESECTION  / RECTOPEXY    . TEE WITHOUT CARDIOVERSION N/A 05/04/2017   Procedure: TRANSESOPHAGEAL ECHOCARDIOGRAM (TEE);  Surgeon: Wellington Hampshire, MD;  Location: [facility] ORS;  Service: Cardiovascular;  Laterality: N/A;  . THORACOSCOPY    . vaginectomy      SOCIAL HISTORY:   Social History   Tobacco Use  . Smoking status: Current Every Day Smoker    Packs/day: 0.50    Years: 40.00    Pack years: 20.00  . Smokeless tobacco: Never Used  . Tobacco comment: denied smoking info  Substance Use Topics  . Alcohol use: No    FAMILY HISTORY:   Family History  Problem Relation Age of Onset  . Cancer Mother   . Stomach cancer Mother   . Heart disease Father   . Hypertension Father   . Parkinson's disease Sister   . Cervical cancer Sister     DRUG ALLERGIES:   Allergies  Allergen Reactions  . Penicillin G Hives and Itching    Has patient had a PCN reaction causing immediate rash, facial/tongue/throat swelling, SOB or lightheadedness with hypotension: Yes Has patient had a PCN reaction causing severe rash involving mucus membranes or skin necrosis: No Has patient had a PCN reaction that required hospitalization:  No Has patient had a PCN reaction occurring within the last 10 years: Yes If all of the above answers are "NO", then may proceed with Cephalosporin use.  . Succinylcholine     Patient's daughter had emergency surgery and was awake for the entire procedure, was told she should not have succ's and neither should her mom     MEDICATIONS AT HOME:   Prior to Admission medications   Medication Sig Start Date End Date Taking? Authorizing Provider  albuterol (PROVENTIL HFA;VENTOLIN HFA) 108 (90 Base) MCG/ACT inhaler Inhale 2 puffs into the lungs every 6 (six) hours as needed. 04/13/17 04/13/18 Yes [provider]  amLODipine (NORVASC) 5 MG tablet Take 1 tablet (5 mg total) by mouth daily. 09/23/17 12/26/18 Yes End, Harrell Gave, MD  aspirin EC 81 MG tablet Take 1 tablet by mouth daily.  04/09/17 04/09/18 Yes [provider]  BREO ELLIPTA 100-25 MCG/INH AEPB Inhale 1 puff into the lungs 2 (two) times daily.   Yes [provider]  losartan (COZAAR) 50 MG tablet Take 50 mg by mouth daily.   Yes [provider]  metoprolol succinate (TOPROL-XL) 25 MG 24 hr tablet Take 1 tablet (25 mg total) by mouth 2 (two) times daily. Patient taking differently: Take 25 mg by mouth daily.  06/17/17 06/17/18 Yes End, Harrell Gave, MD  ondansetron (ZOFRAN ODT) 4 MG disintegrating tablet Take 1 tablet (4 mg total) by mouth every 8 (eight) hours as needed for nausea or vomiting. 12/26/17  Yes Earleen Newport, MD  pravastatin (PRAVACHOL) 10 MG tablet Take 10 mg by mouth at bedtime.   Yes [provider]  apixaban (ELIQUIS) 5 MG TABS tablet Take 1 tablet (5 mg total) by mouth 2 (two) times daily. Patient not taking: Reported on 09/23/2017 07/16/17   End, Harrell Gave, MD    REVIEW OF SYSTEMS:  Review of Systems  Constitutional: Negative for chills, fever, malaise/fatigue and weight loss.  HENT: Negative for ear pain, hearing loss and tinnitus.   Eyes: Negative for blurred vision, double vision, pain and redness.  Respiratory: Negative for cough, hemoptysis and shortness of breath.   Cardiovascular: Negative for chest pain, palpitations, orthopnea and leg swelling.  Gastrointestinal: Positive for abdominal pain, nausea and vomiting. Negative for constipation and diarrhea.  Genitourinary: Negative for dysuria, frequency and hematuria.  Musculoskeletal: Negative for back pain, joint pain and neck pain.  Skin:       No acne, rash, or lesions  Neurological: Negative for dizziness, tremors, focal weakness and weakness.  Endo/Heme/Allergies: Negative for polydipsia. Does not bruise/bleed easily.  Psychiatric/Behavioral: Negative for depression. The patient is not nervous/anxious and does not have insomnia.      VITAL SIGNS:   Vitals:   12/28/17 2027 12/28/17 2038  12/28/17 2318 12/28/17 2320  BP:  110/77 122/81   Pulse:  (!) 112  100  Resp:  17    Temp:  98.2 F (36.8 C)    TempSrc:  Oral    SpO2:  96%  97%  Weight: 68 kg (150 lb)     Height: 5\' 4"  (1.626 m)      Wt Readings from Last 3 Encounters:  12/28/17 68 kg (150 lb)  12/26/17 68 kg (150 lb)  09/23/17 70 kg (154 lb 4 oz)    PHYSICAL EXAMINATION:  Physical Exam  Vitals reviewed. Constitutional: She is oriented to person, place, and time. She appears well-developed and well-nourished. No distress.  HENT:  Head: Normocephalic and atraumatic.  Dry mucous membranes  Eyes: Conjunctivae and EOM are normal. Pupils are equal, round, and reactive to light. No scleral icterus.  Neck: Normal range of motion. Neck supple. No JVD present. No thyromegaly present.  Cardiovascular: Normal rate, regular rhythm and intact distal pulses. Exam reveals no gallop and no friction rub.  No murmur heard. Respiratory: Effort normal and breath sounds normal. No respiratory distress. She has no wheezes. She has no rales.  GI: Soft. Bowel sounds are normal. She exhibits no distension. There is tenderness.  Musculoskeletal: Normal range of motion. She exhibits no edema.  No arthritis, no gout  Lymphadenopathy:    She has no cervical adenopathy.  Neurological: She is alert and oriented to person, place, and time. No cranial nerve deficit.  No dysarthria, no aphasia  Skin: Skin is warm and dry. No rash noted. No erythema.  Psychiatric: She has a normal mood and affect. Her behavior is normal. Judgment and thought content normal.    LABORATORY PANEL:   CBC Recent Labs  Lab 12/28/17 2028  WBC 8.9  HGB 16.9*  HCT 48.0*  PLT 347   ------------------------------------------------------------------------------------------------------------------  Chemistries  Recent Labs  Lab 12/28/17 2028  NA 134*  K 4.5  CL 96*  CO2 21*  GLUCOSE 211*  BUN 46*  CREATININE 2.05*  CALCIUM 10.1  AST 37  ALT 24   ALKPHOS 87  BILITOT 2.0*   ------------------------------------------------------------------------------------------------------------------  Cardiac Enzymes Recent Labs  Lab 12/26/17 2039  TROPONINI 0.03*   ------------------------------------------------------------------------------------------------------------------  RADIOLOGY:  Ct Abdomen Pelvis Wo Contrast  Result Date: 12/28/2017 CLINICAL DATA:  67 year old female with abdominal pain. EXAM: CT ABDOMEN AND PELVIS WITHOUT CONTRAST TECHNIQUE: Multidetector CT imaging of the abdomen and pelvis was performed following the standard protocol without IV contrast. COMPARISON:  Abdominal radiograph dated 12/26/2017 and CT dated 04/28/2017 FINDINGS: Evaluation of this exam is limited in the absence of intravenous contrast. Lower chest: The visualized lung bases are clear. Postsurgical changes of CABG. No intra-abdominal free air or free fluid. Hepatobiliary: Diffuse fatty infiltration of the liver. There is irregularity of the the liver surface concerning for cirrhosis. Clinical correlation is recommended. No intrahepatic biliary ductal dilatation. The gallbladder is surgically absent. Pancreas: Unremarkable. No pancreatic ductal dilatation or surrounding inflammatory changes. Spleen: Normal in size without focal abnormality. Adrenals/Urinary Tract: The adrenal glands are unremarkable. Mild to moderate bilateral renal atrophy. An 11 mm exophytic lesion from the medial inferior pole of the right kidney is not characterized on the CT but appears stable in size compared to the prior CT. There is no hydronephrosis or obstructing stone on either side. Renal vascular calcifications noted. The visualized ureters and urinary bladder appear unremarkable. 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 There is a moderate-sized parastomal hernia containing multiple loops of small bowel. There is  dilatation of loops of bowel within the hernia sac as well as dilatation of the small bowel proximal to the hernia. The distal small bowel loops are collapsed. There is a transition within the hernia. There is postsurgical changes of the sigmoid colon with anastomotic suture. Normal appendix. Vascular/Lymphatic: There is advanced aortoiliac atherosclerotic disease. No portal venous gas. There is no adenopathy. Reproductive: Hysterectomy.  No pelvic mass. Other: Midline vertical anterior pelvic wall incisional scar. There is a broad-based midline ventral hernia containing a short segment of the transverse colon. Musculoskeletal: Osteopenia with degenerative changes of the spine. No acute osseous pathology. IMPRESSION: 1. Small-bowel obstruction with transition in the parastomal hernia in the  anterior right lower quadrant. 2. Fatty liver with findings suggestive of cirrhosis. 3.  Aortic Atherosclerosis (ICD10-I70.0). Electronically Signed   By: Anner Crete M.D.   On: 12/28/2017 22:32    EKG:   Orders placed or performed during the hospital encounter of 12/26/17  . ED EKG  . ED EKG  . EKG 12-Lead  . EKG 12-Lead    IMPRESSION AND PLAN:  Principal Problem:   UTI (urinary tract infection) -IV antibiotics, urine culture sent Active Problems:   Acute renal failure (HCC) -IV fluids tonight, avoid nephrotoxins and monitor for improvement   CAD in native artery -continue home meds   Essential hypertension -continue home medications   Paroxysmal atrial fibrillation ([facility]) -home rate controlling meds   Hepatic cirrhosis ([place]) -avoid hepatotoxins  All the records are reviewed and case discussed with ED provider. Management plans discussed with the patient and/or family.  DVT PROPHYLAXIS: SubQ heparin  GI PROPHYLAXIS: None  ADMISSION STATUS: Observation  CODE STATUS: Full Code Status History    Date Active Date Inactive Code Status Order ID Comments User Context   04/29/2017 22:07 05/04/2017  19:54 Full Code [id number]  Nicholes Mango, MD Inpatient    Advance Directive Documentation     Most Recent Value  Type of Advance Directive  Living will  Pre-existing out of facility DNR order (yellow form or pink MOST form)  No data  "MOST" Form in Place?  No data      TOTAL TIME TAKING CARE OF THIS PATIENT: 40 minutes.   Luvenia Cranford [place] 12/28/2017, 11:47 PM  [facility]  Office  [phone]  CC: Primary care physician; Glendon Axe, MD  Note:  This document was prepared using Dragon voice recognition software and may include unintentional dictation errors.

## 2017-12-28 NOTE — ED Notes (Signed)
Resumed care from samantha rn.  Pt waiting on admission.

## 2017-12-29 ENCOUNTER — Other Ambulatory Visit: Payer: Self-pay

## 2017-12-29 ENCOUNTER — Inpatient Hospital Stay: Payer: Medicare Other

## 2017-12-29 DIAGNOSIS — I2510 Atherosclerotic heart disease of native coronary artery without angina pectoris: Secondary | ICD-10-CM | POA: Diagnosis present

## 2017-12-29 DIAGNOSIS — N179 Acute kidney failure, unspecified: Secondary | ICD-10-CM | POA: Diagnosis present

## 2017-12-29 DIAGNOSIS — Z820 Family history of epilepsy and other diseases of the nervous system: Secondary | ICD-10-CM | POA: Diagnosis not present

## 2017-12-29 DIAGNOSIS — I25118 Atherosclerotic heart disease of native coronary artery with other forms of angina pectoris: Secondary | ICD-10-CM | POA: Diagnosis not present

## 2017-12-29 DIAGNOSIS — Z951 Presence of aortocoronary bypass graft: Secondary | ICD-10-CM | POA: Diagnosis not present

## 2017-12-29 DIAGNOSIS — Z7982 Long term (current) use of aspirin: Secondary | ICD-10-CM | POA: Diagnosis not present

## 2017-12-29 DIAGNOSIS — Z8049 Family history of malignant neoplasm of other genital organs: Secondary | ICD-10-CM | POA: Diagnosis not present

## 2017-12-29 DIAGNOSIS — K56609 Unspecified intestinal obstruction, unspecified as to partial versus complete obstruction: Secondary | ICD-10-CM | POA: Diagnosis not present

## 2017-12-29 DIAGNOSIS — Z7951 Long term (current) use of inhaled steroids: Secondary | ICD-10-CM | POA: Diagnosis not present

## 2017-12-29 DIAGNOSIS — K433 Parastomal hernia with obstruction, without gangrene: Secondary | ICD-10-CM | POA: Diagnosis present

## 2017-12-29 DIAGNOSIS — T45516A Underdosing of anticoagulants, initial encounter: Secondary | ICD-10-CM | POA: Diagnosis present

## 2017-12-29 DIAGNOSIS — E860 Dehydration: Secondary | ICD-10-CM | POA: Diagnosis present

## 2017-12-29 DIAGNOSIS — I480 Paroxysmal atrial fibrillation: Secondary | ICD-10-CM | POA: Diagnosis present

## 2017-12-29 DIAGNOSIS — Z8249 Family history of ischemic heart disease and other diseases of the circulatory system: Secondary | ICD-10-CM | POA: Diagnosis not present

## 2017-12-29 DIAGNOSIS — I10 Essential (primary) hypertension: Secondary | ICD-10-CM | POA: Diagnosis present

## 2017-12-29 DIAGNOSIS — Z902 Acquired absence of lung [part of]: Secondary | ICD-10-CM | POA: Diagnosis not present

## 2017-12-29 DIAGNOSIS — K7460 Unspecified cirrhosis of liver: Secondary | ICD-10-CM | POA: Diagnosis present

## 2017-12-29 DIAGNOSIS — E785 Hyperlipidemia, unspecified: Secondary | ICD-10-CM | POA: Diagnosis present

## 2017-12-29 DIAGNOSIS — Z91120 Patient's intentional underdosing of medication regimen due to financial hardship: Secondary | ICD-10-CM | POA: Diagnosis not present

## 2017-12-29 DIAGNOSIS — N390 Urinary tract infection, site not specified: Secondary | ICD-10-CM | POA: Diagnosis present

## 2017-12-29 DIAGNOSIS — Z880 Allergy status to penicillin: Secondary | ICD-10-CM | POA: Diagnosis not present

## 2017-12-29 DIAGNOSIS — B9620 Unspecified Escherichia coli [E. coli] as the cause of diseases classified elsewhere: Secondary | ICD-10-CM | POA: Diagnosis present

## 2017-12-29 DIAGNOSIS — Z932 Ileostomy status: Secondary | ICD-10-CM | POA: Diagnosis not present

## 2017-12-29 DIAGNOSIS — F17210 Nicotine dependence, cigarettes, uncomplicated: Secondary | ICD-10-CM | POA: Diagnosis present

## 2017-12-29 DIAGNOSIS — E876 Hypokalemia: Secondary | ICD-10-CM | POA: Diagnosis present

## 2017-12-29 DIAGNOSIS — Z85118 Personal history of other malignant neoplasm of bronchus and lung: Secondary | ICD-10-CM | POA: Diagnosis not present

## 2017-12-29 LAB — CBC
HEMATOCRIT: 43.5 % (ref 35.0–47.0)
HEMOGLOBIN: 15 g/dL (ref 12.0–16.0)
MCH: 32.8 pg (ref 26.0–34.0)
MCHC: 34.6 g/dL (ref 32.0–36.0)
MCV: 95 fL (ref 80.0–100.0)
Platelets: 330 10*3/uL (ref 150–440)
RBC: 4.58 MIL/uL (ref 3.80–5.20)
RDW: 12.5 % (ref 11.5–14.5)
WBC: 6.5 10*3/uL (ref 3.6–11.0)

## 2017-12-29 LAB — BASIC METABOLIC PANEL
ANION GAP: 16 — AB (ref 5–15)
BUN: 51 mg/dL — ABNORMAL HIGH (ref 6–20)
CO2: 24 mmol/L (ref 22–32)
Calcium: 9.5 mg/dL (ref 8.9–10.3)
Chloride: 97 mmol/L — ABNORMAL LOW (ref 101–111)
Creatinine, Ser: 2.13 mg/dL — ABNORMAL HIGH (ref 0.44–1.00)
GFR calc non Af Amer: 23 mL/min — ABNORMAL LOW (ref >60)
GFR, EST AFRICAN AMERICAN: 26 mL/min — AB (ref >60)
Glucose, Bld: 164 mg/dL — ABNORMAL HIGH (ref 65–99)
POTASSIUM: 4.1 mmol/L (ref 3.5–5.1)
SODIUM: 137 mmol/L (ref 135–145)

## 2017-12-29 LAB — TROPONIN I
Troponin I: 0.03 ng/mL (ref <0.03)
Troponin I: 0.03 ng/mL (ref <0.03)

## 2017-12-29 MED ORDER — SODIUM CHLORIDE 0.9 % IV SOLN
INTRAVENOUS | Status: AC
  Administered 2017-12-29 – 2017-12-31 (×5): via INTRAVENOUS

## 2017-12-29 MED ORDER — METOPROLOL SUCCINATE ER 50 MG PO TB24
25.0000 mg | ORAL_TABLET | Freq: Every day | ORAL | Status: DC
  Administered 2018-01-02 – 2018-01-04 (×3): 25 mg via ORAL
  Filled 2017-12-29 (×4): qty 1

## 2017-12-29 MED ORDER — PROMETHAZINE HCL 25 MG/ML IJ SOLN
12.5000 mg | Freq: Four times a day (QID) | INTRAMUSCULAR | Status: DC | PRN
  Administered 2017-12-29: 12.5 mg via INTRAVENOUS
  Filled 2017-12-29: qty 1

## 2017-12-29 MED ORDER — ONDANSETRON HCL 4 MG/2ML IJ SOLN
4.0000 mg | Freq: Four times a day (QID) | INTRAMUSCULAR | Status: DC | PRN
  Administered 2017-12-29 – 2017-12-30 (×3): 4 mg via INTRAVENOUS
  Filled 2017-12-29 (×3): qty 2

## 2017-12-29 MED ORDER — SODIUM CHLORIDE 0.9 % IV SOLN
INTRAVENOUS | Status: AC
  Administered 2017-12-29: 03:00:00 via INTRAVENOUS

## 2017-12-29 MED ORDER — SODIUM CHLORIDE 0.9 % IV SOLN
1.0000 g | INTRAVENOUS | Status: DC
  Administered 2017-12-29 – 2018-01-03 (×6): 1 g via INTRAVENOUS
  Filled 2017-12-29 (×6): qty 10

## 2017-12-29 MED ORDER — ONDANSETRON HCL 4 MG PO TABS: 4.0000 mg | ORAL_TABLET | Freq: Four times a day (QID) | ORAL | Status: DC | PRN

## 2017-12-29 MED ORDER — PHENOL 1.4 % MT LIQD
1.0000 | OROMUCOSAL | Status: DC | PRN
  Administered 2017-12-29: 1 via OROMUCOSAL
  Filled 2017-12-29: qty 177

## 2017-12-29 MED ORDER — HEPARIN SODIUM (PORCINE) 5000 UNIT/ML IJ SOLN
5000.0000 [IU] | Freq: Three times a day (TID) | INTRAMUSCULAR | Status: DC
  Administered 2017-12-29 – 2018-01-03 (×14): 5000 [IU] via SUBCUTANEOUS
  Filled 2017-12-29 (×14): qty 1

## 2017-12-29 MED ORDER — PRAVASTATIN SODIUM 10 MG PO TABS
10.0000 mg | ORAL_TABLET | Freq: Every day | ORAL | Status: DC
  Administered 2018-01-02 – 2018-01-03 (×2): 10 mg via ORAL
  Filled 2017-12-29 (×7): qty 1

## 2017-12-29 MED ORDER — FLUTICASONE FUROATE-VILANTEROL 100-25 MCG/INH IN AEPB
1.0000 | INHALATION_SPRAY | Freq: Two times a day (BID) | RESPIRATORY_TRACT | Status: DC
  Administered 2017-12-29 – 2018-01-04 (×13): 1 via RESPIRATORY_TRACT
  Filled 2017-12-29 (×5): qty 28

## 2017-12-29 MED ORDER — ASPIRIN EC 81 MG PO TBEC: 81.0000 mg | DELAYED_RELEASE_TABLET | Freq: Every day | ORAL | Status: DC

## 2017-12-29 NOTE — ED Notes (Signed)
primedoc in with pt for admission.  

## 2017-12-29 NOTE — Consult Note (Addendum)
Date of Consultation:  12/29/2017  Requesting Physician:  Demetrios Loll, MD  Reason for Consultation:  Small bowel obstruction  History of Present Illness: Carly Khan is a 67 y.o. female who presents with a 3 day history of abdominal pain, nausea, and vomiting.  Patient initially presented to the emergency room on 2/23 with abdominal pain and nausea with vomiting.  KUB was obtained which did not show any free air or evidence of small bowel obstruction.  When she was discharged to home.  She then presented again on 2/25 with continued abdominal pain, nausea, and vomiting.  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  She was admitted to the hospitalist team surgery is now consulted for evaluation for small bowel obstruction.  Today she reports that her pain is doing better compared to yesterday she was not feeling nauseous at the time of evaluation but she did have emesis in the morning.  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  She moved to [place] last year had seen Dr. Adonis Huguenin in June 2018 for evaluation for ileostomy takedown.  At that point part of her workup included a CT scan of the chest, abdomen and pelvis which revealed a left atrial appendage mural thrombus.  She was seen by cardiology at that time and started on Eliquis.  It appears that over the course of the months until now, she has been intermittently on and off anticoagulation she is not able to afford her Eliquis.  Upon further workup by cardiology she has been noted to have paroxysmal atrial fibrillation and Dr. Saunders Revel recommended to to need lifelong anticoagulation.  EKG on  this admission shows normal sinus rhythm.  She also has a history of left lung cancer and she had a left upper lobe lobectomy in 2017 as well.  She had a follow-up CT of the chest last month but has not followed up with oncology yet.  No colonoscopy has been done yet as this was waiting on decision about her anticoagulation and she has been lost to follow-up in the system.  Past Medical History: Past Medical History:  Diagnosis Date  . CAD in native artery    a. s/p CABG in 1991 in [place].  Marland Kitchen Cancer of left lung ([facility]) 2016   LUL lung resection  . CKD (chronic kidney disease) stage 3, GFR 30-59 ml/min ([place]) 04/09/2017   a. 04/2017 ARF & Hyperkalemia req HD x 1.  . 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 Diverticulitis   . Essential hypertension 04/09/2017  . Family history of adverse reaction to anesthesia   . History of sebaceous cyst 02/28/2017  . Intermittent vertigo 04/09/2017  . Itching 02/28/2017  . Left Atrial Appendage Thrombus    a. Incidentally noted on CT 04/2017;  b. 04/2017 Echo: EF 65-70%, Gr1 DD, no LA filling defect seen; c. 05/2017 TEE: EF 60-65%, no RA/LA thrombus.  . S/P CABG (coronary artery bypass graft) 1991     Past Surgical History: Past Surgical History:  Procedure Laterality Date  . ABDOMINAL HYSTERECTOMY    . cardiac bypass  1990  . carpel tunnel    . CHOLECYSTECTOMY    . COLON SURGERY    .  COLONOSCOPY    . CT guided needle placement    . EXPLORATORY LAPAROTOMY    . GALLBLADDER SURGERY    . loop ileostomy    . SIGMOID RESECTION / RECTOPEXY    . TEE WITHOUT CARDIOVERSION N/A 05/04/2017   Procedure: TRANSESOPHAGEAL ECHOCARDIOGRAM (TEE);  Surgeon: Wellington Hampshire, MD;  Location: [facility] ORS;  Service: Cardiovascular;  Laterality: N/A;  . THORACOSCOPY    . vaginectomy      Home Medications: Prior to Admission medications   Medication Sig Start Date End Date Taking? Authorizing Provider  albuterol (PROVENTIL HFA;VENTOLIN HFA) 108 (90 Base) MCG/ACT inhaler Inhale 2 puffs into  the lungs every 6 (six) hours as needed. 04/13/17 04/13/18 Yes [provider]  amLODipine (NORVASC) 5 MG tablet Take 1 tablet (5 mg total) by mouth daily. 09/23/17 12/26/18 Yes End, Harrell Gave, MD  aspirin EC 81 MG tablet Take 1 tablet by mouth daily. 04/09/17 04/09/18 Yes [provider]  BREO ELLIPTA 100-25 MCG/INH AEPB Inhale 1 puff into the lungs 2 (two) times daily.   Yes [provider]  losartan (COZAAR) 50 MG tablet Take 50 mg by mouth daily.   Yes [provider]  metoprolol succinate (TOPROL-XL) 25 MG 24 hr tablet Take 1 tablet (25 mg total) by mouth 2 (two) times daily. Patient taking differently: Take 25 mg by mouth daily.  06/17/17 06/17/18 Yes End, Harrell Gave, MD  ondansetron (ZOFRAN ODT) 4 MG disintegrating tablet Take 1 tablet (4 mg total) by mouth every 8 (eight) hours as needed for nausea or vomiting. 12/26/17  Yes Earleen Newport, MD  pravastatin (PRAVACHOL) 10 MG tablet Take 10 mg by mouth at bedtime.   Yes [provider]  apixaban (ELIQUIS) 5 MG TABS tablet Take 1 tablet (5 mg total) by mouth 2 (two) times daily. Patient not taking: Reported on 09/23/2017 07/16/17   [name], MD    Allergies: Allergies  Allergen Reactions  . Penicillin G Hives and Itching    Has patient had a PCN reaction causing immediate rash, facial/tongue/throat swelling, SOB or lightheadedness with hypotension: Yes Has patient had a PCN reaction causing severe rash involving mucus membranes or skin necrosis: No Has patient had a PCN reaction that required hospitalization: No Has patient had a PCN reaction occurring within the last 10 years: Yes If all of the above answers are "NO", then may proceed with Cephalosporin use.  . Succinylcholine     Patient's daughter had emergency surgery and was awake for the entire procedure, was told she should not have succ's and neither should her mom     Social History:  reports that she has been smoking.  She  has a 20.00 pack-year smoking history. she has never used smokeless tobacco. She reports that she does not drink alcohol or use drugs.   Family History: Family History  Problem Relation Age of Onset  . Cancer Mother   . Stomach cancer Mother   . Heart disease Father   . Hypertension Father   . Parkinson's disease Sister   . Cervical cancer Sister     Review of Systems: Review of Systems  Constitutional: Negative for chills and fever.  HENT: Negative for hearing loss.   Respiratory: Negative for shortness of breath.   Cardiovascular: Negative for chest pain.  Gastrointestinal: Positive for abdominal pain, nausea and vomiting. Negative for constipation and diarrhea.  Genitourinary: Negative for dysuria.  Musculoskeletal: Negative for myalgias.  Neurological: Negative for dizziness.  Psychiatric/Behavioral: Negative for depression.  All other systems reviewed and are negative.   Physical Exam BP 105/63   Pulse (!) 117   Temp 97.6 F (36.4 C) (Oral)   Resp 19   Ht 5\' 4"  (1.626 m)   Wt 68.1 kg (150 lb 1.6 oz)   SpO2 95%   BMI 25.76 kg/m  CONSTITUTIONAL: No acute distress HEENT:  Normocephalic, atraumatic, extraocular motion intact, with very poor dentition. NECK: Trachea is midline, and there is no jugular venous distension.  RESPIRATORY:  Lungs are clear, and breath sounds are equal bilaterally. Normal respiratory effort without pathologic use of accessory muscles. CARDIOVASCULAR: Heart is regular without murmurs, gallops, or rubs. GI: The abdomen is soft, mildly distended, with tenderness to palpation in the right lower quadrant around the loop ileostomy as well as some soreness in the midline ventral hernia that the patient has.  These 2 hernias have been known for some time in the they are not new.  On further palpation, her parastomal hernia is easily reducible with some mild discomfort but then the bulging goes away readily. MUSCULOSKELETAL:  Normal muscle strength and  tone in all four extremities.  No peripheral edema or cyanosis. SKIN: Skin turgor is normal. There are no pathologic skin lesions.  NEUROLOGIC:  Motor and sensation is grossly normal.  Cranial nerves are grossly intact. PSYCH:  Alert and oriented to person, place and time. Affect is normal.  Laboratory Analysis: Results for orders placed or performed during the hospital encounter of 12/28/17 (from the past 24 hour(s))  Lipase, blood     Status: None   Collection Time: 12/28/17  8:28 PM  Result Value Ref Range   Lipase 28 11 - 51 U/L  Comprehensive metabolic panel     Status: Abnormal   Collection Time: 12/28/17  8:28 PM  Result Value Ref Range   Sodium 134 (L) 135 - 145 mmol/L   Potassium 4.5 3.5 - 5.1 mmol/L   Chloride 96 (L) 101 - 111 mmol/L   CO2 21 (L) 22 - 32 mmol/L   Glucose, Bld 211 (H) 65 - 99 mg/dL   BUN 46 (H) 6 - 20 mg/dL   Creatinine, Ser 2.05 (H) 0.44 - 1.00 mg/dL   Calcium 10.1 8.9 - 10.3 mg/dL   Total Protein 9.9 (H) 6.5 - 8.1 g/dL   Albumin 5.1 (H) 3.5 - 5.0 g/dL   AST 37 15 - 41 U/L   ALT 24 14 - 54 U/L   Alkaline Phosphatase 87 38 - 126 U/L   Total Bilirubin 2.0 (H) 0.3 - 1.2 mg/dL   GFR calc non Af Amer 24 (L) >60 mL/min   GFR calc Af Amer 28 (L) >60 mL/min   Anion gap 17 (H) 5 - 15  CBC     Status: Abnormal   Collection Time: 12/28/17  8:28 PM  Result Value Ref Range   WBC 8.9 3.6 - 11.0 K/uL   RBC 5.14 3.80 - 5.20 MIL/uL   Hemoglobin 16.9 (H) 12.0 - 16.0 g/dL   HCT 48.0 (H) 35.0 - 47.0 %   MCV 93.4 80.0 - 100.0 fL   MCH 32.9 26.0 - 34.0 pg   MCHC 35.3 32.0 - 36.0 g/dL   RDW 12.5 11.5 - 14.5 %   Platelets 347 150 - 440 K/uL  Urinalysis, Complete w Microscopic     Status: Abnormal   Collection Time: 12/28/17  9:23 PM  Result Value Ref Range   Color, Urine AMBER (A) YELLOW   APPearance CLOUDY (A)  CLEAR   Specific Gravity, Urine 1.021 1.005 - 1.030   pH 5.0 5.0 - 8.0   Glucose, UA NEGATIVE NEGATIVE mg/dL   Hgb urine dipstick MODERATE (A) NEGATIVE    Bilirubin Urine NEGATIVE NEGATIVE   Ketones, ur NEGATIVE NEGATIVE mg/dL   Protein, ur 30 (A) NEGATIVE mg/dL   Nitrite NEGATIVE NEGATIVE   Leukocytes, UA LARGE (A) NEGATIVE   RBC / HPF 6-30 0 - 5 RBC/hpf   WBC, UA TOO NUMEROUS TO COUNT 0 - 5 WBC/hpf   Bacteria, UA MANY (A) NONE SEEN   Squamous Epithelial / LPF 6-30 (A) NONE SEEN   WBC Clumps PRESENT    Mucus PRESENT    Hyaline Casts, UA PRESENT   Troponin I     Status: Abnormal   Collection Time: 12/29/17  2:58 AM  Result Value Ref Range   Troponin I 0.03 (HH) <0.03 ng/mL  Basic metabolic panel     Status: Abnormal   Collection Time: 12/29/17  2:58 AM  Result Value Ref Range   Sodium 137 135 - 145 mmol/L   Potassium 4.1 3.5 - 5.1 mmol/L   Chloride 97 (L) 101 - 111 mmol/L   CO2 24 22 - 32 mmol/L   Glucose, Bld 164 (H) 65 - 99 mg/dL   BUN 51 (H) 6 - 20 mg/dL   Creatinine, Ser 2.13 (H) 0.44 - 1.00 mg/dL   Calcium 9.5 8.9 - 10.3 mg/dL   GFR calc non Af Amer 23 (L) >60 mL/min   GFR calc Af Amer 26 (L) >60 mL/min   Anion gap 16 (H) 5 - 15  CBC     Status: None   Collection Time: 12/29/17  2:58 AM  Result Value Ref Range   WBC 6.5 3.6 - 11.0 K/uL   RBC 4.58 3.80 - 5.20 MIL/uL   Hemoglobin 15.0 12.0 - 16.0 g/dL   HCT 43.5 35.0 - 47.0 %   MCV 95.0 80.0 - 100.0 fL   MCH 32.8 26.0 - 34.0 pg   MCHC 34.6 32.0 - 36.0 g/dL   RDW 12.5 11.5 - 14.5 %   Platelets 330 150 - 440 K/uL  Troponin I     Status: Abnormal   Collection Time: 12/29/17  8:31 AM  Result Value Ref Range   Troponin I 0.03 (HH) <0.03 ng/mL    Imaging: Ct Abdomen Pelvis Wo Contrast  Result Date: 12/28/2017 CLINICAL DATA:  67 year old female with abdominal pain. EXAM: CT ABDOMEN AND PELVIS WITHOUT CONTRAST TECHNIQUE: Multidetector CT imaging of the abdomen and pelvis was performed following the standard protocol without IV contrast. COMPARISON:  Abdominal radiograph dated 12/26/2017 and CT dated 04/28/2017 FINDINGS: Evaluation of this exam is limited in the absence of  intravenous contrast. Lower chest: The visualized lung bases are clear. Postsurgical changes of CABG. No intra-abdominal free air or free fluid. Hepatobiliary: Diffuse fatty infiltration of the liver. There is irregularity of the the liver surface concerning for cirrhosis. Clinical correlation is recommended. No intrahepatic biliary ductal dilatation. The gallbladder is surgically absent. Pancreas: Unremarkable. No pancreatic ductal dilatation or surrounding inflammatory changes. Spleen: Normal in size without focal abnormality. Adrenals/Urinary Tract: The adrenal glands are unremarkable. Mild to moderate bilateral renal atrophy. An 11 mm exophytic lesion from the medial inferior pole of the right kidney is not characterized on the CT but appears stable in size compared to the prior CT. There is no hydronephrosis or obstructing stone on either side. Renal vascular calcifications noted. The visualized ureters and urinary  bladder appear unremarkable. 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 There is a moderate-sized parastomal hernia containing multiple loops of small bowel. There is dilatation of loops of bowel within the hernia sac as well as dilatation of the small bowel proximal to the hernia. The distal small bowel loops are collapsed. There is a transition within the hernia. There is postsurgical changes of the sigmoid colon with anastomotic suture. Normal appendix. Vascular/Lymphatic: There is advanced aortoiliac atherosclerotic disease. No portal venous gas. There is no adenopathy. Reproductive: Hysterectomy.  No pelvic mass. Other: Midline vertical anterior pelvic wall incisional scar. There is a broad-based midline ventral hernia containing a short segment of the transverse colon. Musculoskeletal: Osteopenia with degenerative changes of the spine. No acute osseous pathology. IMPRESSION: 1. Small-bowel obstruction with transition in the  parastomal hernia in the anterior right lower quadrant. 2. Fatty liver with findings suggestive of cirrhosis. 3.  Aortic Atherosclerosis (ICD10-I70.0). Electronically Signed   By: Anner Crete M.D.   On: 12/28/2017 22:32   Dg Abd 1 View  Result Date: 12/29/2017 CLINICAL DATA:  NG tube placement. EXAM: ABDOMEN - 1 VIEW COMPARISON:  CT 2019.  Chest x-ray 12/26/2017. FINDINGS: NG tube noted with tip over distal stomach. Prior median sternotomy. Surgical clips right upper quadrant. No bowel distention. No free air. Contrast in the colon. Left-sided pleural thickening. IMPRESSION: NG tube noted with tip over the distal stomach. Electronically Signed   By: Marcello Moores  Register   On: 12/29/2017 14:50    Assessment and Plan: This is a 67 y.o. female who presents with small bowel obstruction secondary to a parastomal hernia.  I have independently reviewed the patient's imaging studies and reviewed the patient's laboratory studies.  Patient's CT scan does show small bowel obstruction with transition point at the parastomal hernia.  There is no free air or fluid or ascites noted.  There is no pending rupture.  White blood cell count is normal her creatinine is mildly elevated compared to her baseline.  The patient has been lost to follow-up through the months given her other comorbidities.  At this point, there is no urgent need to take her to the operating room for her parastomal hernia.  This is easily reducible at bedside.  NG tube was placed this morning at my request and one liter of gastric contents were in the canister at the time of my evaluation.  KUB was ordered which shows NG tube in the stomach.  At this point will recommend conservative management for her small bowel obstruction.  Continue n.p.o. management with IV fluid hydration as well as NG tube to low intermittent suction.  Will await return of bowel function.  Once she has more regular consistent bowel function through the loop ileostomy, we can  perform a clamp trial of her NG tube and subsequent removal.    She also has not been on any consistent or regular anticoagulation as had been recommended by Dr. Saunders Revel with cardiology given her paroxysmal atrial fibrillation and left atrial appendage mural thrombus.  Would recommend inpatient cardiology consultation to determine if she still needs anticoagulation or not.  She will need follow-up with the surgery team as an outpatient so that we can restart the process of clearance and testing prior to ileostomy takedown.  Patient understands this plan and all of her questions have been answered.  Face-to-face time spent with the patient and care providers was 110 minutes, with more than 50% of the time  spent counseling, educating, and coordinating care of the patient.     Melvyn Neth, [place]

## 2017-12-29 NOTE — Progress Notes (Signed)
MD Piscpoya was paged regarding chest xray. No further orders at this time.

## 2017-12-29 NOTE — Progress Notes (Signed)
Patient was made NPO. Patient have vomited a green bowel substance. Will continue to monitor.

## 2017-12-29 NOTE — Progress Notes (Signed)
[facility] [place] NAME: Carly Khan    MR#:  [id number]  DATE OF BIRTH:  [birth year]  SUBJECTIVE:  CHIEF COMPLAINT:   Chief Complaint  Patient presents with  . Emesis  . Nausea   Nausea and vomiting his abdominal pain. REVIEW OF SYSTEMS:  Review of Systems  Constitutional: Positive for malaise/fatigue. Negative for chills and fever.  HENT: Negative for sore throat.   Eyes: Negative for blurred vision and double vision.  Respiratory: Negative for cough, hemoptysis, shortness of breath, wheezing and stridor.   Cardiovascular: Negative for chest pain, palpitations, orthopnea and leg swelling.  Gastrointestinal: Positive for abdominal pain, constipation, nausea and vomiting. Negative for blood in stool, diarrhea and melena.  Genitourinary: Negative for dysuria, flank pain and hematuria.  Musculoskeletal: Negative for back pain and joint pain.  Skin: Negative for rash.  Neurological: Positive for weakness. Negative for dizziness, sensory change, focal weakness, seizures, loss of consciousness and headaches.  Endo/Heme/Allergies: Negative for polydipsia.  Psychiatric/Behavioral: Negative for depression. The patient is not nervous/anxious.     DRUG ALLERGIES:   Allergies  Allergen Reactions  . Penicillin G Hives and Itching    Has patient had a PCN reaction causing immediate rash, facial/tongue/throat swelling, SOB or lightheadedness with hypotension: Yes Has patient had a PCN reaction causing severe rash involving mucus membranes or skin necrosis: No Has patient had a PCN reaction that required hospitalization: No Has patient had a PCN reaction occurring within the last 10 years: Yes If all of the above answers are "NO", then may proceed with Cephalosporin use.  . Succinylcholine     Patient's daughter had emergency surgery and was awake for the entire procedure, was told she should not have succ's and neither should her mom     VITALS:  Blood pressure 105/63, pulse (!) 117, temperature 97.6 F (36.4 C), temperature source Oral, resp. rate 19, height 5\' 4"  (1.626 m), weight 150 lb 1.6 oz (68.1 kg), SpO2 95 %. PHYSICAL EXAMINATION:  Physical Exam  Constitutional: She is oriented to person, place, and time and well-developed, well-nourished, and in no distress.  HENT:  Head: Normocephalic.  Mouth/Throat: Oropharynx is clear and moist.  Eyes: Conjunctivae and EOM are normal. Pupils are equal, round, and reactive to light. No scleral icterus.  Neck: Normal range of motion. Neck supple. No JVD present. No tracheal deviation present.  Cardiovascular: Normal rate, regular rhythm and normal heart sounds. Exam reveals no gallop.  No murmur heard. Pulmonary/Chest: Effort normal and breath sounds normal. No respiratory distress. She has no wheezes. She has no rales.  Abdominal: Soft. Bowel sounds are normal. She exhibits distension. There is tenderness. There is no rebound and no guarding.  Colostomy bag in situ without stool.  Musculoskeletal: Normal range of motion. She exhibits no edema or tenderness.  Neurological: She is alert and oriented to person, place, and time. No cranial nerve deficit.  Skin: No rash noted. No erythema.  Psychiatric: Affect normal.   LABORATORY PANEL:  Female CBC Recent Labs  Lab 12/29/17 0258  WBC 6.5  HGB 15.0  HCT 43.5  PLT 330   ------------------------------------------------------------------------------------------------------------------ Chemistries  Recent Labs  Lab 12/28/17 2028 12/29/17 0258  NA 134* 137  K 4.5 4.1  CL 96* 97*  CO2 21* 24  GLUCOSE 211* 164*  BUN 46* 51*  CREATININE 2.05* 2.13*  CALCIUM 10.1 9.5  AST 37  --   ALT 24  --   ALKPHOS 87  --  BILITOT 2.0*  --    RADIOLOGY:  Ct Abdomen Pelvis Wo Contrast  Result Date: 12/28/2017 CLINICAL DATA:  67 year old female with abdominal pain. EXAM: CT ABDOMEN AND PELVIS WITHOUT CONTRAST TECHNIQUE:  Multidetector CT imaging of the abdomen and pelvis was performed following the standard protocol without IV contrast. COMPARISON:  Abdominal radiograph dated 12/26/2017 and CT dated 04/28/2017 FINDINGS: Evaluation of this exam is limited in the absence of intravenous contrast. Lower chest: The visualized lung bases are clear. Postsurgical changes of CABG. No intra-abdominal free air or free fluid. Hepatobiliary: Diffuse fatty infiltration of the liver. There is irregularity of the the liver surface concerning for cirrhosis. Clinical correlation is recommended. No intrahepatic biliary ductal dilatation. The gallbladder is surgically absent. Pancreas: Unremarkable. No pancreatic ductal dilatation or surrounding inflammatory changes. Spleen: Normal in size without focal abnormality. Adrenals/Urinary Tract: The adrenal glands are unremarkable. Mild to moderate bilateral renal atrophy. An 11 mm exophytic lesion from the medial inferior pole of the right kidney is not characterized on the CT but appears stable in size compared to the prior CT. There is no hydronephrosis or obstructing stone on either side. Renal vascular calcifications noted. The visualized ureters and urinary bladder appear unremarkable. 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 There is a moderate-sized parastomal hernia containing multiple loops of small bowel. There is dilatation of loops of bowel within the hernia sac as well as dilatation of the small bowel proximal to the hernia. The distal small bowel loops are collapsed. There is a transition within the hernia. There is postsurgical changes of the sigmoid colon with anastomotic suture. Normal appendix. Vascular/Lymphatic: There is advanced aortoiliac atherosclerotic disease. No portal venous gas. There is no adenopathy. Reproductive: Hysterectomy.  No pelvic mass. Other: Midline vertical anterior pelvic wall incisional scar. There  is a broad-based midline ventral hernia containing a short segment of the transverse colon. Musculoskeletal: Osteopenia with degenerative changes of the spine. No acute osseous pathology. IMPRESSION: 1. Small-bowel obstruction with transition in the parastomal hernia in the anterior right lower quadrant. 2. Fatty liver with findings suggestive of cirrhosis. 3.  Aortic Atherosclerosis (ICD10-I70.0). Electronically Signed   By: Anner Crete M.D.   On: 12/28/2017 22:32   Dg Abd 1 View  Result Date: 12/29/2017 CLINICAL DATA:  NG tube placement. EXAM: ABDOMEN - 1 VIEW COMPARISON:  CT 2019.  Chest x-ray 12/26/2017. FINDINGS: NG tube noted with tip over distal stomach. Prior median sternotomy. Surgical clips right upper quadrant. No bowel distention. No free air. Contrast in the colon. Left-sided pleural thickening. IMPRESSION: NG tube noted with tip over the distal stomach. Electronically Signed   By: Marcello Moores  Register   On: 12/29/2017 14:50   ASSESSMENT AND PLAN:   SBO,  CT: Small-bowel obstruction with transition in the parastomal herniain the anterior right lower quadrant. NPO, NGT placed.  Follow-up surgical consult.   UTI (urinary tract infection) -IV antibiotics, urine culture sent    Acute renal failure Continue normal saline IV and follow-up BMP.  Avoid nephrotoxins.    CAD in native artery, hold home meds due to above.   Essential hypertension. hold home meds due to above.   Paroxysmal atrial fibrillation (HCC) -home rate controlling meds   Hepatic cirrhosis (HCC) -avoid hepatotoxins History of lung cancer. Tobacco abuse.  Smoking cessation was counseled for 3-4 minutes.  All the records are reviewed and case discussed with Care Management/Social Worker. Management plans discussed with the patient, family and they are  in agreement.  CODE STATUS: Full Code  TOTAL TIME TAKING CARE OF THIS PATIENT: 39 minutes.   More than 50% of the time was spent in counseling/coordination of care:  YES  POSSIBLE D/C IN 3 DAYS, DEPENDING ON CLINICAL CONDITION.   Demetrios Loll M.D on 12/29/2017 at 3:03 PM  Between 7am to 6pm - Pager - [phone]  After 6pm go to [email or web address] - [facility]

## 2017-12-29 NOTE — Progress Notes (Signed)
Pharmacy Antibiotic Note  Carly Khan is a 67 y.o. female admitted on 12/28/2017 with UTI.  Pharmacy has been consulted for levaquin initially; however patient has a QTc of 502 on EKG, spoke w/ MD to switch to ceftriaxone. Patient does have a reaction to pen G of itching and hives; however cross-reactivity between these agents is < 10%.  Plan: Will start ceftriaxone 1g IV daily for 3 to 5 days.  Height: 5\' 4"  (162.6 cm) Weight: 150 lb 1.6 oz (68.1 kg) IBW/kg (Calculated) : 54.7  Temp (24hrs), Avg:97.9 F (36.6 C), Min:97.6 F (36.4 C), Max:98.2 F (36.8 C)  Recent Labs  Lab 12/26/17 1844 12/28/17 2028 12/29/17 0258  WBC 5.8 8.9 6.5  CREATININE 1.25* 2.05* 2.13*    Estimated Creatinine Clearance: 24 mL/min (A) (by C-G formula based on SCr of 2.13 mg/dL (H)).    Allergies  Allergen Reactions  . Penicillin G Hives and Itching    Has patient had a PCN reaction causing immediate rash, facial/tongue/throat swelling, SOB or lightheadedness with hypotension: Yes Has patient had a PCN reaction causing severe rash involving mucus membranes or skin necrosis: No Has patient had a PCN reaction that required hospitalization: No Has patient had a PCN reaction occurring within the last 10 years: Yes If all of the above answers are "NO", then may proceed with Cephalosporin use.  . Succinylcholine     Patient's daughter had emergency surgery and was awake for the entire procedure, was told she should not have succ's and neither should her mom     Thank you for allowing pharmacy to be a part of this patient's care.  Tobie Lords, PharmD, BCPS Clinical Pharmacist 12/29/2017

## 2017-12-29 NOTE — ED Notes (Signed)
Report called to vivan rn floor nurse

## 2017-12-30 ENCOUNTER — Ambulatory Visit: Payer: Medicare Other | Admitting: Internal Medicine

## 2017-12-30 ENCOUNTER — Encounter: Payer: Self-pay | Admitting: Nurse Practitioner

## 2017-12-30 DIAGNOSIS — I480 Paroxysmal atrial fibrillation: Secondary | ICD-10-CM

## 2017-12-30 DIAGNOSIS — N179 Acute kidney failure, unspecified: Secondary | ICD-10-CM

## 2017-12-30 DIAGNOSIS — I25118 Atherosclerotic heart disease of native coronary artery with other forms of angina pectoris: Secondary | ICD-10-CM

## 2017-12-30 DIAGNOSIS — I10 Essential (primary) hypertension: Secondary | ICD-10-CM

## 2017-12-30 LAB — BASIC METABOLIC PANEL
Anion gap: 15 (ref 5–15)
BUN: 62 mg/dL — ABNORMAL HIGH (ref 6–20)
CO2: 24 mmol/L (ref 22–32)
CREATININE: 1.95 mg/dL — AB (ref 0.44–1.00)
Calcium: 8.9 mg/dL (ref 8.9–10.3)
Chloride: 101 mmol/L (ref 101–111)
GFR calc non Af Amer: 25 mL/min — ABNORMAL LOW (ref >60)
GFR, EST AFRICAN AMERICAN: 29 mL/min — AB (ref >60)
Glucose, Bld: 127 mg/dL — ABNORMAL HIGH (ref 65–99)
POTASSIUM: 3.6 mmol/L (ref 3.5–5.1)
Sodium: 140 mmol/L (ref 135–145)

## 2017-12-30 LAB — MAGNESIUM: Magnesium: 2.2 mg/dL (ref 1.7–2.4)

## 2017-12-30 NOTE — Progress Notes (Signed)
12/30/2017  Subjective: Patient remains with NG tube in place and had 1700 ml out yesterday and night.  Had some mild nausea this morning.  No bowel function yet through ostomy bag.  Vital signs: Temp:  [97.3 F (36.3 C)-98.9 F (37.2 C)] 97.3 F (36.3 C) (02/27 0743) Pulse Rate:  [78-106] 78 (02/27 0743) Resp:  [16-18] 16 (02/27 0743) BP: (128-137)/(64-72) 129/64 (02/27 0743) SpO2:  [95 %-97 %] 95 % (02/27 0743)   Intake/Output: 02/26 0701 - 02/27 0700 In: 2293.3 [I.V.:2193.3; IV Piggyback:100] Out: 2250 [Urine:350; Emesis/NG output:1900] Last BM Date: 12/29/17  Physical Exam: Constitutional: No acute distress Abdomen:  Soft, mildly distended, with no significant pain to palpation, except mild pain around the ostomy.  The parastomal hernia had protruded again and was easily reduced with only some discomfort.  NG tube in place with clear bilious fluid.  Labs:  Recent Labs    12/28/17 2028 12/29/17 0258  WBC 8.9 6.5  HGB 16.9* 15.0  HCT 48.0* 43.5  PLT 347 330   Recent Labs    12/29/17 0258 12/30/17 0301  NA 137 140  K 4.1 3.6  CL 97* 101  CO2 24 24  GLUCOSE 164* 127*  BUN 51* 62*  CREATININE 2.13* 1.95*  CALCIUM 9.5 8.9   No results for input(s): LABPROT, INR in the last 72 hours.  Imaging: Dg Abd 1 View  Result Date: 12/29/2017 CLINICAL DATA:  NG tube placement. EXAM: ABDOMEN - 1 VIEW COMPARISON:  CT 2019.  Chest x-ray 12/26/2017. FINDINGS: NG tube noted with tip over distal stomach. Prior median sternotomy. Surgical clips right upper quadrant. No bowel distention. No free air. Contrast in the colon. Left-sided pleural thickening. IMPRESSION: NG tube noted with tip over the distal stomach. Electronically Signed   By: Marcello Moores  Register   On: 12/29/2017 14:50    Assessment/Plan: 67 yo female with SBO  --continue NG tube to suction, OK to pull back NG about 10 cm since the tip is in distal stomach, likely post-pyloric actually based on KUB yesterday. --continue  NPO, IV fluid hydration. --no urgent surgical needs at this point, but will continue following along with you.   Melvyn Neth, [place]

## 2017-12-30 NOTE — Progress Notes (Signed)
[facility] [place] NAME: Carly Khan    MR#:  [id number]  DATE OF BIRTH:  December 02, 1950  SUBJECTIVE:  CHIEF COMPLAINT:   Chief Complaint  Patient presents with  . Emesis  . Nausea   Better nausea and abdominal pain. On NGT suction with greenish fluid. REVIEW OF SYSTEMS:  Review of Systems  Constitutional: Positive for malaise/fatigue. Negative for chills and fever.  HENT: Negative for sore throat.   Eyes: Negative for blurred vision and double vision.  Respiratory: Negative for cough, hemoptysis, shortness of breath, wheezing and stridor.   Cardiovascular: Negative for chest pain, palpitations, orthopnea and leg swelling.  Gastrointestinal: Positive for abdominal pain, constipation and nausea. Negative for blood in stool, diarrhea, melena and vomiting.  Genitourinary: Negative for dysuria, flank pain and hematuria.  Musculoskeletal: Negative for back pain and joint pain.  Skin: Negative for rash.  Neurological: Positive for weakness. Negative for dizziness, sensory change, focal weakness, seizures, loss of consciousness and headaches.  Endo/Heme/Allergies: Negative for polydipsia.  Psychiatric/Behavioral: Negative for depression. The patient is not nervous/anxious.     DRUG ALLERGIES:   Allergies  Allergen Reactions  . Penicillin G Hives and Itching    Has patient had a PCN reaction causing immediate rash, facial/tongue/throat swelling, SOB or lightheadedness with hypotension: Yes Has patient had a PCN reaction causing severe rash involving mucus membranes or skin necrosis: No Has patient had a PCN reaction that required hospitalization: No Has patient had a PCN reaction occurring within the last 10 years: Yes If all of the above answers are "NO", then may proceed with Cephalosporin use.  . Succinylcholine     Patient's daughter had emergency surgery and was awake for the entire procedure, was told she should not have succ's and  neither should her mom    VITALS:  Blood pressure 129/64, pulse 78, temperature (!) 97.3 F (36.3 C), temperature source Oral, resp. rate 16, height 5\' 4"  (1.626 m), weight 150 lb 1.6 oz (68.1 kg), SpO2 95 %. PHYSICAL EXAMINATION:  Physical Exam  Constitutional: She is oriented to person, place, and time and well-developed, well-nourished, and in no distress.  HENT:  Head: Normocephalic.  Mouth/Throat: Oropharynx is clear and moist.  Eyes: Conjunctivae and EOM are normal. Pupils are equal, round, and reactive to light. No scleral icterus.  Neck: Normal range of motion. Neck supple. No JVD present. No tracheal deviation present.  Cardiovascular: Normal rate, regular rhythm and normal heart sounds. Exam reveals no gallop.  No murmur heard. Pulmonary/Chest: Effort normal and breath sounds normal. No respiratory distress. She has no wheezes. She has no rales.  Abdominal: Soft. Bowel sounds are normal. She exhibits distension. There is tenderness. There is no rebound and no guarding.  Colostomy bag in situ without stool.  Musculoskeletal: Normal range of motion. She exhibits no edema or tenderness.  Neurological: She is alert and oriented to person, place, and time. No cranial nerve deficit.  Skin: No rash noted. No erythema.  Psychiatric: Affect normal.   LABORATORY PANEL:  Female CBC Recent Labs  Lab 12/29/17 0258  WBC 6.5  HGB 15.0  HCT 43.5  PLT 330   ------------------------------------------------------------------------------------------------------------------ Chemistries  Recent Labs  Lab 12/28/17 2028  12/30/17 0301  NA 134*   < > 140  K 4.5   < > 3.6  CL 96*   < > 101  CO2 21*   < > 24  GLUCOSE 211*   < > 127*  BUN 46*   < > 62*  CREATININE 2.05*   < > 1.95*  CALCIUM 10.1   < > 8.9  MG  --   --  2.2  AST 37  --   --   ALT 24  --   --   ALKPHOS 87  --   --   BILITOT 2.0*  --   --    < > = values in this interval not displayed.   RADIOLOGY:  Dg Abd 1  View  Result Date: 12/29/2017 CLINICAL DATA:  NG tube placement. EXAM: ABDOMEN - 1 VIEW COMPARISON:  CT 2019.  Chest x-ray 12/26/2017. FINDINGS: NG tube noted with tip over distal stomach. Prior median sternotomy. Surgical clips right upper quadrant. No bowel distention. No free air. Contrast in the colon. Left-sided pleural thickening. IMPRESSION: NG tube noted with tip over the distal stomach. Electronically Signed   By: Marcello Moores  Register   On: 12/29/2017 14:50   ASSESSMENT AND PLAN:   SBO,  CT: Small-bowel obstruction with transition in the parastomal herniain the anterior right lower quadrant. NPO, NGT suction.  Per Dr. Hampton Abbot, no indication for surgery this time. continue NPO with NGT suction, IV fluid hydration.   UTI (urinary tract infection)   urine culture:   Abnormal  >=100,000 COLONIES/mL ESCHERICHIA COLI      Continue IV Rocephin.    Acute renal failure Continue normal saline IV and follow-up BMP.  Avoid nephrotoxins.    CAD in native artery, hold home meds due to above.   Essential hypertension. hold home meds due to above.   Paroxysmal atrial fibrillation (HCC) -continue beta-blocker When she can reliably take p.o.'s, it would be reasonable to initiate Coumadin, though this would need to be held perioperatively, once she undergoes takedown of her ileostomy at some point in the future cardiology consult.     Hepatic cirrhosis (HCC) -avoid hepatotoxins History of lung cancer. Tobacco abuse.  Smoking cessation was counseled for 3-4 minutes.  Discussed with Dr. Rockey Situ. All the records are reviewed and case discussed with Care Management/Social Worker. Management plans discussed with the patient, her daughter and they are in agreement.  CODE STATUS: Full Code  TOTAL TIME TAKING CARE OF THIS PATIENT: 36 minutes.   More than 50% of the time was spent in counseling/coordination of care: YES  POSSIBLE D/C IN 2 DAYS, DEPENDING ON CLINICAL CONDITION.   Demetrios Loll M.D on  12/30/2017 at 1:59 PM  Between 7am to 6pm - Pager - [phone]  After 6pm go to [email or web address] - [facility]

## 2017-12-30 NOTE — Consult Note (Signed)
Cardiology Consult    Patient ID: Carly Khan MRN: [id number], DOB/AGE: Jul 21, 1951   Admit date: 12/28/2017 Date of Consult: 12/30/2017  Primary Physician: Glendon Axe, MD Primary Cardiologist: [name], MD Requesting Provider: Q. Bridgett Larsson, MD  Patient Profile    Carly Khan is a 67 y.o. female with a history of CAD s/p remote CABG, HTN, HL, PAF w/ LAA thrombus, noncompliance w/ oral anticoagulation (finances), lung cancer s/p LUL resection, chronic dyspnea, colonic fistula, and CKD III, who is being seen today for the evaluation of PAF and decision re: [place] at the request of Dr. Bridgett Larsson.  Past Medical History   Past Medical History:  Diagnosis Date  . CAD in native artery 1991   a. s/p CABG in 1991 in [place]; b. 06/2017 MV: EF 72%, no ischemia/infarct-->Low risk.  . Cancer of left lung ([place]) 2016   a. 12/2015 LUL lung resection.  . Chronic Dyspnea on exertion   . CKD (chronic kidney disease) stage 3, GFR 30-59 ml/min ([place]) 04/09/2017   a. 04/2017 ARF & Hyperkalemia req HD x 1.  . 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 Diverticulitis   . Essential hypertension 04/09/2017  . Family history of adverse reaction to anesthesia   . History of sebaceous cyst 02/28/2017  . History of stress test   . Intermittent vertigo 04/09/2017  . Itching 02/28/2017  . Left Atrial Appendage Thrombus    a. Incidentally noted on CT 04/2017;  b. 04/2017 Echo: EF 65-70%, Gr1 DD, no LA filling defect seen; c. 05/2017 TEE: EF 60-65%, no RA/LA thrombus; d. Later found to have PAF on Event monitor-->chronic Eliquis.  Marland Kitchen PAF (paroxysmal atrial fibrillation) ([place])    a. Discovered 06/2017 on Event monitor as part of w/u for LAA thrombus. CHA2DS2VASc = 4-->Eliquis.    Past Surgical History:  Procedure Laterality Date  . ABDOMINAL HYSTERECTOMY    . cardiac bypass  1990  . carpel tunnel    . CHOLECYSTECTOMY    . COLON SURGERY    . COLONOSCOPY    . CT guided needle placement    . EXPLORATORY LAPAROTOMY    . GALLBLADDER SURGERY     . loop ileostomy    . SIGMOID RESECTION / RECTOPEXY    . TEE WITHOUT CARDIOVERSION N/A 05/04/2017   Procedure: TRANSESOPHAGEAL ECHOCARDIOGRAM (TEE);  Surgeon: Wellington Hampshire, MD;  Location: [facility] ORS;  Service: Cardiovascular;  Laterality: N/A;  . THORACOSCOPY    . vaginectomy       Allergies  Allergies  Allergen Reactions  . Penicillin G Hives and Itching    Has patient had a PCN reaction causing immediate rash, facial/tongue/throat swelling, SOB or lightheadedness with hypotension: Yes Has patient had a PCN reaction causing severe rash involving mucus membranes or skin necrosis: No Has patient had a PCN reaction that required hospitalization: No Has patient had a PCN reaction occurring within the last 10 years: Yes If all of the above answers are "NO", then may proceed with Cephalosporin use.  . Succinylcholine     Patient's daughter had emergency surgery and was awake for the entire procedure, was told she should not have succ's and neither should her mom     History of Present Illness    67 year old female with the above complex past medical history including coronary artery disease status post remote CABG in 1999, hypertension, hyperlipidemia, lung cancer status post left upper lobe resection in 2016, chronic dyspnea, colonic fistula, stage III chronic kidney disease, and paroxysmal atrial fibrillation complicated by  left atrial appendage thrombus.  Left atrial appendage thrombus was diagnosed incidentally over the summer 2018.  She has a history of colovaginal fistula treated with  exploratory laparotomy and sigmoidectomy fistula takedown with anastomosis as well as diverting loop ileostomy in January 2017.  In the summer 2018, she underwent evaluation for ileostomy takedown, which included a CT of the chest abdomen and pelvis.  This revealed left atrial appendage thrombus.  Neither echo nor TEE confirmed this however, decision was made to place her on Eliquis and hold off on  ostomy takedown.  30-day event monitor later showed paroxysmal atrial fibrillation.  Unfortunately, she has had trouble obtaining Eliquis due to cost.  For a period of time, she was receiving it through a discount pharmacy but then whatever plan she had ran out.  She says she has been off of Eliquis for about 3 months.  Patient has chronic dyspnea in the setting of ongoing tobacco abuse.  In that setting, she underwent stress testing in the fall 2018 which was nonischemic.  She has not followed up with cardiology since November.  She had not informed us that she was not taking anticoagulation.  From a cardiac standpoint she says she has done well and denies chest pain or palpitations.  She was in her usual state of health until this past Saturday evening, February 23, when she developed nausea and vomiting.  She was seen in the emergency department where KUB showed no acute findings.  She was treated with Zofran and discharged.  Unfortunately, symptoms persisted and she developed abdominal discomfort.  She re-presented to the ED on February 25 and was found to have a small bowel obstruction.  She has been seen by surgery with recommendation for NG tube conservative therapy for the time being.  We have been consulted given patient's noncompliance with anticoagulation.  Overall, she is slowly improving.  She continues to have intermittent nausea and abdominal discomfort.  Inpatient Medications    . fluticasone furoate-vilanterol  1 puff Inhalation BID  . heparin  5,000 Units Subcutaneous Q8H  . metoprolol succinate  25 mg Oral Daily  . pravastatin  10 mg Oral QHS    Family History    Family History  Problem Relation Age of Onset  . Cancer Mother   . Stomach cancer Mother   . Heart disease Father   . Hypertension Father   . Parkinson's disease Sister   . Cervical cancer Sister    indicated that her mother is deceased. She indicated that her father is deceased. She indicated that her sister is  alive.   Social History    Social History   Socioeconomic History  . Marital status: Divorced    Spouse name: Not on file  . Number of children: Not on file  . Years of education: Not on file  . Highest education level: Not on file  Social Needs  . Financial resource strain: Not on file  . Food insecurity - worry: Not on file  . Food insecurity - inability: Not on file  . Transportation needs - medical: Not on file  . Transportation needs - non-medical: Not on file  Occupational History  . Not on file  Tobacco Use  . Smoking status: Current Every Day Smoker    Packs/day: 0.50    Years: 40.00    Pack years: 20.00  . Smokeless tobacco: Never Used  . Tobacco comment: denied smoking info  Substance and Sexual Activity  . Alcohol use: No  .  Drug use: No  . Sexual activity: No  Other Topics Concern  . Not on file  Social History Narrative  . Not on file     Review of Systems    General:  No chills, fever, night sweats or weight changes.  Cardiovascular:  No chest pain, +++ chronic low dyspnea on exertion, edema, orthopnea, palpitations, paroxysmal nocturnal dyspnea. Dermatological: No rash, lesions/masses Respiratory: No cough, +++ chronic dyspnea Urologic: No hematuria, dysuria Abdominal:   +++ nausea, +++ vomiting, +++ abd pain.  No diarrhea, bright red blood per rectum, melena, or hematemesis Neurologic:  No visual changes, wkns, changes in mental status. All other systems reviewed and are otherwise negative except as noted above.  Physical Exam    Blood pressure 129/64, pulse 78, temperature (!) 97.3 F (36.3 C), temperature source Oral, resp. rate 16, height 5\' 4"  (1.626 m), weight 150 lb 1.6 oz (68.1 kg), SpO2 95 %.  General: Pleasant, NAD Psych: Normal affect. Neuro: Alert and oriented X 3. Moves all extremities spontaneously. HEENT: Normal  Neck: Supple without bruits or JVD. Lungs:  Resp regular and unlabored, diffuse insp/exp wheezing. Heart: RRR no s3,  s4, or murmurs. Abdomen: Soft, diffusely tender to light palpation, non-distended, BS + x 4. RLQ ostomy. Extremities: No clubbing, cyanosis or edema. DP/PT/Radials 2+ and equal bilaterally.  Labs     Recent Labs    12/29/17 0258 12/29/17 0831  TROPONINI 0.03* 0.03*   Lab Results  Component Value Date   WBC 6.5 12/29/2017   HGB 15.0 12/29/2017   HCT 43.5 12/29/2017   MCV 95.0 12/29/2017   PLT 330 12/29/2017    Recent Labs  Lab 12/28/17 2028  12/30/17 0301  NA 134*   < > 140  K 4.5   < > 3.6  CL 96*   < > 101  CO2 21*   < > 24  BUN 46*   < > 62*  CREATININE 2.05*   < > 1.95*  CALCIUM 10.1   < > 8.9  PROT 9.9*  --   --   BILITOT 2.0*  --   --   ALKPHOS 87  --   --   ALT 24  --   --   AST 37  --   --   GLUCOSE 211*   < > 127*   < > = values in this interval not displayed.    Radiology Studies    Ct Abdomen Pelvis Wo Contrast  Result Date: 12/28/2017 CLINICAL DATA:  67 year old female with abdominal pain. EXAM: CT ABDOMEN AND PELVIS WITHOUT CONTRAST TECHNIQUE: Multidetector CT imaging of the abdomen and pelvis was performed following the standard protocol without IV contrast. COMPARISON:  Abdominal radiograph dated 12/26/2017 and CT dated 04/28/2017 FINDINGS: Evaluation of this exam is limited in the absence of intravenous contrast. Lower chest: The visualized lung bases are clear. Postsurgical changes of CABG. No intra-abdominal free air or free fluid. Hepatobiliary: Diffuse fatty infiltration of the liver. There is irregularity of the the liver surface concerning for cirrhosis. Clinical correlation is recommended. No intrahepatic biliary ductal dilatation. The gallbladder is surgically absent. Pancreas: Unremarkable. No pancreatic ductal dilatation or surrounding inflammatory changes. Spleen: Normal in size without focal abnormality. Adrenals/Urinary Tract: The adrenal glands are unremarkable. Mild to moderate bilateral renal atrophy. An 11 mm exophytic lesion from the medial  inferior pole of the right kidney is not characterized on the CT but appears stable in size compared to the prior CT. There is no hydronephrosis  or obstructing stone on either side. Renal vascular calcifications noted. The visualized ureters and urinary bladder appear unremarkable. 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 There is a moderate-sized parastomal hernia containing multiple loops of small bowel. There is dilatation of loops of bowel within the hernia sac as well as dilatation of the small bowel proximal to the hernia. The distal small bowel loops are collapsed. There is a transition within the hernia. There is postsurgical changes of the sigmoid colon with anastomotic suture. Normal appendix. Vascular/Lymphatic: There is advanced aortoiliac atherosclerotic disease. No portal venous gas. There is no adenopathy. Reproductive: Hysterectomy.  No pelvic mass. Other: Midline vertical anterior pelvic wall incisional scar. There is a broad-based midline ventral hernia containing a short segment of the transverse colon. Musculoskeletal: Osteopenia with degenerative changes of the spine. No acute osseous pathology. IMPRESSION: 1. Small-bowel obstruction with transition in the parastomal hernia in the anterior right lower quadrant. 2. Fatty liver with findings suggestive of cirrhosis. 3.  Aortic Atherosclerosis (ICD10-I70.0). Electronically Signed   By: Anner Crete M.D.   On: 12/28/2017 22:32   Dg Abd 1 View  Result Date: 12/29/2017 CLINICAL DATA:  NG tube placement. EXAM: ABDOMEN - 1 VIEW COMPARISON:  CT 2019.  Chest x-ray 12/26/2017. FINDINGS: NG tube noted with tip over distal stomach. Prior median sternotomy. Surgical clips right upper quadrant. No bowel distention. No free air. Contrast in the colon. Left-sided pleural thickening. IMPRESSION: NG tube noted with tip over the distal stomach. Electronically Signed   By: Marcello Moores  Register    On: 12/29/2017 14:50   Dg Abd 2 Views  Result Date: 12/26/2017 CLINICAL DATA:  Abdominal pain and vomiting EXAM: ABDOMEN - 2 VIEW COMPARISON:  CT abdomen pelvis 04/28/2017 FINDINGS: The bowel gas pattern is normal. There is no evidence of free air. No radio-opaque calculi or other significant radiographic abnormality is seen. Retained contrast in the proximal colon. IMPRESSION: No free intraperitoneal air or evidence of small-bowel obstruction. Electronically Signed   By: Ulyses Jarred M.D.   On: 12/26/2017 19:48    ECG & Cardiac Imaging    RSR, 81, prior ant infarct w/ mild lateral ST dep and T flattening - less pronounced than on prior ECG.  Assessment & Plan    1.  Small bowel obstruction: Patient admitted February 25 with a several day history of nausea, vomiting, and abdominal discomfort.  She has been seen by surgery and receiving IV hydration and NG tube decompression.  Overall, she is slowly improving.  No plans for surgery at this time.  2.  Paroxysmal atrial fibrillation with history of left atrial appendage thrombus: Thrombus incidentally diagnosed in June 2018.  This was never confirmed on TEE however, she was later noted to have paroxysmal atrial fibrillation on monitoring.  She was asymptomatic.  She is unaware of any recurrence of A. fib.  She had been taking Eliquis but has been off of this for about 3 months secondary to difficulty affording it.  With a CHA2DS2VASc of 4 and prior history of left atrial appendage thrombus, we would like to see her on oral anticoagulation.  Coumadin is likely a more affordable option for her as she does not have pharmacy/prescription coverage but does have insurance and therefore would not likely qualify for assistance programs.  When she can reliably take p.o.'s, it would be reasonable to initiate Coumadin, though this would need to be held perioperatively, once she undergoes takedown of her ileostomy at  some point in the future.  Continue  beta-blocker.  She is not currently on telemetry.  3.  Coronary artery disease: Status post remote CABG.  Recent nonischemic stress testing in the fall 2018.  She does have chronic dyspnea on exertion with limited activity in the setting of ongoing tobacco abuse.  With recent negative stress test, she would not require any additional ischemic evaluation prior to ileostomy takedown.  4.  Essential hypertension: Stable.  5.  Hyperlipidemia: Continue statin therapy.  6.  Elevated troponin: Troponin 0 0.03 x 2.  No chest pain.  This does not represent acute coronary syndrome.  More likely demand ischemia in the setting of #1.  No ischemic evaluation warranted at this time.  7.  Tob Abuse:  Cessation advised.  Signed, Murray Hodgkins, NP 12/30/2017, 9:57 AM  For questions or updates, please contact   Please consult [email or web address] for contact info under Cardiology/STEMI.

## 2017-12-31 ENCOUNTER — Inpatient Hospital Stay: Payer: Medicare Other

## 2017-12-31 LAB — URINE CULTURE: Culture: >=100000 — AB

## 2017-12-31 LAB — BASIC METABOLIC PANEL
Anion gap: 10 (ref 5–15)
BUN: 45 mg/dL — AB (ref 6–20)
CO2: 25 mmol/L (ref 22–32)
CREATININE: 1.22 mg/dL — AB (ref 0.44–1.00)
Calcium: 8.3 mg/dL — ABNORMAL LOW (ref 8.9–10.3)
Chloride: 106 mmol/L (ref 101–111)
GFR calc non Af Amer: 44 mL/min — ABNORMAL LOW (ref >60)
GFR, EST AFRICAN AMERICAN: 52 mL/min — AB (ref >60)
Glucose, Bld: 87 mg/dL (ref 65–99)
POTASSIUM: 3.7 mmol/L (ref 3.5–5.1)
SODIUM: 141 mmol/L (ref 135–145)

## 2017-12-31 LAB — MAGNESIUM: MAGNESIUM: 2.2 mg/dL (ref 1.7–2.4)

## 2017-12-31 MED ORDER — DIATRIZOATE MEGLUMINE & SODIUM 66-10 % PO SOLN
90.0000 mL | Freq: Once | ORAL | Status: AC
  Administered 2017-12-31: 90 mL via NASOGASTRIC

## 2017-12-31 NOTE — Progress Notes (Signed)
Took over care of pt for [place], Therapist, sports. Pt alert resting in room. NG tube hooked to suction. Pt NPO. IV fluids infusing. Pt on room air. No complaints or questions at this time.

## 2017-12-31 NOTE — Progress Notes (Signed)
12/31/2017  Subjective: Patient without bowel function yet.  Reports feeling hungry but nothing per ostomy yet.  NG tube output decreased compared to prior day.  Vital signs: Temp:  [98.1 F (36.7 C)-99.1 F (37.3 C)] 98.5 F (36.9 C) (02/28 0726) Pulse Rate:  [60-88] 60 (02/28 0726) Resp:  [16-18] 18 (02/28 0726) BP: (121-131)/(60-85) 128/60 (02/28 0726) SpO2:  [95 %-98 %] 95 % (02/28 0726)   Intake/Output: 02/27 0701 - 02/28 0700 In: 2406.7 [I.V.:2306.7; IV Piggyback:100] Out: 1300 [Urine:400; Emesis/NG output:900] Last BM Date: 12/29/17  Physical Exam: Constitutional: No acute distress Abdomen:  Soft, nondistended, with mild tenderness to palpation over her parastomal hernia.  Bowel herniating reduced without complication but some soreness with compression.  Nothing in ostomy bag yet.  Labs:  Recent Labs    12/28/17 2028 12/29/17 0258  WBC 8.9 6.5  HGB 16.9* 15.0  HCT 48.0* 43.5  PLT 347 330   Recent Labs    12/30/17 0301 12/31/17 0403  NA 140 141  K 3.6 3.7  CL 101 106  CO2 24 25  GLUCOSE 127* 87  BUN 62* 45*  CREATININE 1.95* 1.22*  CALCIUM 8.9 8.3*   No results for input(s): LABPROT, INR in the last 72 hours.  Imaging: Dg Abd 1 View  Result Date: 12/31/2017 CLINICAL DATA:  Small bowel obstruction. EXAM: ABDOMEN - 1 VIEW COMPARISON:  Lower chest and upper abdominal x-ray of December 29, 2016. Abdominal and pelvic CT scan of December 28, 2017. FINDINGS: There remain loops of mildly distended gas-filled small bowel in the upper abdomen. Less distended small bowel loops are noted in the pelvis. There is contrast within normal caliber right colon and rectum. There is gas and soft tissue density in the right inguinal region consistent with a known inguinal hernia. There surgical clips in the gallbladder fossa. The esophagogastric tube tip projects in the region of the descending duodenum with the proximal port at approximately the level of the duodenal bulb. There  is multilevel degenerative disc disease of the lumbar spine. IMPRESSION: Findings compatible with a mid to distal small bowel obstruction possibly secondary to a known right inguinal hernia. No free extraluminal gas collections are observed. Electronically Signed   By: David  Martinique M.D.   On: 12/31/2017 08:01    Assessment/Plan: 67 yo female with small bowel obstruction.  --will try gastrograffin challenge today.  This will be given via NG tube, with tube clamped for one hour following gastrograffin and then can be placed back to intermittent suction.  We'll obtain KUB later this afternoon to assess contrast progression. --patient understands that if this is not successful and there is no bowel function, she may need to go to OR tomorrow for exlap. --otherwise continue NPO with NG tube and IV fluid hydration.   Melvyn Neth, [place]

## 2017-12-31 NOTE — Progress Notes (Signed)
Per MD order, pt given the gastrogaffin 90 ml via NG tube at 1045. NG clamped for one hour. At 1210 NG placed back to low intermittent suction. One view abdominal xray ordered for 1845. Pt tolerated all well. Pt's NG has put out 500 cc of green liquid during my shift. No stool via ostomy.

## 2017-12-31 NOTE — Progress Notes (Signed)
[place] [facility] [place] NAME: Carly Khan    MR#:  [id number]  DATE OF BIRTH:  [birth year]  SUBJECTIVE:   Patient is hungry. No abdominal pain or nausea.  REVIEW OF SYSTEMS:    Review of Systems  Constitutional: Negative for fever, chills weight loss HENT: Negative for ear pain, nosebleeds, congestion, facial swelling, rhinorrhea, neck pain, neck stiffness and ear discharge.   Respiratory: Negative for cough, shortness of breath, wheezing  Cardiovascular: Negative for chest pain, palpitations and leg swelling.  Gastrointestinal: Negative for heartburn, abdominal pain, vomiting, diarrhea or consitpation Genitourinary: Negative for dysuria, urgency, frequency, hematuria Musculoskeletal: Negative for back pain or joint pain Neurological: Negative for dizziness, seizures, syncope, focal weakness,  numbness and headaches.  Hematological: Does not bruise/bleed easily.  Psychiatric/Behavioral: Negative for hallucinations, confusion, dysphoric mood    Tolerating Diet: Nothing by mouth      DRUG ALLERGIES:   Allergies  Allergen Reactions  . Penicillin G Hives and Itching    Has patient had a PCN reaction causing immediate rash, facial/tongue/throat swelling, SOB or lightheadedness with hypotension: Yes Has patient had a PCN reaction causing severe rash involving mucus membranes or skin necrosis: No Has patient had a PCN reaction that required hospitalization: No Has patient had a PCN reaction occurring within the last 10 years: Yes If all of the above answers are "NO", then may proceed with Cephalosporin use.  . Succinylcholine     Patient's daughter had emergency surgery and was awake for the entire procedure, was told she should not have succ's and neither should her mom     VITALS:  Blood pressure 128/60, pulse 60, temperature 98.5 F (36.9 C), temperature source Oral, resp. rate 18, height 5\' 4"  (1.626 m), weight 68.1 kg (150 lb 1.6  oz), SpO2 95 %.  PHYSICAL EXAMINATION:  Constitutional: Appears well-developed and well-nourished. No distress. HENT: Normocephalic. .NGT placed Eyes: Conjunctivae and EOM are normal. PERRLA, no scleral icterus.  Neck: Normal ROM. Neck supple. No JVD. No tracheal deviation. CVS: RRR, S1/S2 +, no murmurs, no gallops, no carotid bruit.  Pulmonary: Effort and breath sounds normal, no stridor, rhonchi, wheezes, rales.  Abdominal: Soft. Hypoactive bowel sounds She has ostomy no distension, tenderness, rebound or guarding.  Musculoskeletal: Normal range of motion. No edema and no tenderness.  Neuro: Alert. CN 2-12 grossly intact. No focal deficits. Skin: Skin is warm and dry. No rash noted. Psychiatric: Normal mood and affect.      LABORATORY PANEL:   CBC Recent Labs  Lab 12/29/17 0258  WBC 6.5  HGB 15.0  HCT 43.5  PLT 330   ------------------------------------------------------------------------------------------------------------------  Chemistries  Recent Labs  Lab 12/28/17 2028  12/31/17 0403  NA 134*   < > 141  K 4.5   < > 3.7  CL 96*   < > 106  CO2 21*   < > 25  GLUCOSE 211*   < > 87  BUN 46*   < > 45*  CREATININE 2.05*   < > 1.22*  CALCIUM 10.1   < > 8.3*  MG  --    < > 2.2  AST 37  --   --   ALT 24  --   --   ALKPHOS 87  --   --   BILITOT 2.0*  --   --    < > = values in this interval not displayed.   ------------------------------------------------------------------------------------------------------------------  Cardiac Enzymes Recent Labs  Lab 12/26/17 2039  12/29/17 0258 12/29/17 0831  TROPONINI 0.03* 0.03* 0.03*   ------------------------------------------------------------------------------------------------------------------  RADIOLOGY:  Dg Abd 1 View  Result Date: 12/31/2017 CLINICAL DATA:  Small bowel obstruction. EXAM: ABDOMEN - 1 VIEW COMPARISON:  Lower chest and upper abdominal x-ray of December 29, 2016. Abdominal and pelvic CT scan of  December 28, 2017. FINDINGS: There remain loops of mildly distended gas-filled small bowel in the upper abdomen. Less distended small bowel loops are noted in the pelvis. There is contrast within normal caliber right colon and rectum. There is gas and soft tissue density in the right inguinal region consistent with a known inguinal hernia. There surgical clips in the gallbladder fossa. The esophagogastric tube tip projects in the region of the descending duodenum with the proximal port at approximately the level of the duodenal bulb. There is multilevel degenerative disc disease of the lumbar spine. IMPRESSION: Findings compatible with a mid to distal small bowel obstruction possibly secondary to a known right inguinal hernia. No free extraluminal gas collections are observed. Electronically Signed   By: David  Martinique M.D.   On: 12/31/2017 08:01   Dg Abd 1 View  Result Date: 12/29/2017 CLINICAL DATA:  NG tube placement. EXAM: ABDOMEN - 1 VIEW COMPARISON:  CT 2019.  Chest x-ray 12/26/2017. FINDINGS: NG tube noted with tip over distal stomach. Prior median sternotomy. Surgical clips right upper quadrant. No bowel distention. No free air. Contrast in the colon. Left-sided pleural thickening. IMPRESSION: NG tube noted with tip over the distal stomach. Electronically Signed   By: Marcello Moores  Register   On: 12/29/2017 14:50     ASSESSMENT AND PLAN:   66 year old female with history of chronic kidney disease stage III, diverticulitis status post colostomy and CAD who presents with abdominal pain and nausea.  1. Small bowel obstruction: Management as per surgery Try graft Gastrografin challenged today If unsuccessful will need to go to the OR Patient at moderate risk for moderate risk procedure may proceed to surgery without further cardiac intervention  2. PAF: Patient currently in sinus rhythm When she is taking oral medications recommendations are to restart anticoagulation  3. 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 Essential hypertension: Currently on metoprolol         Management plans discussed with the patient and she is in agreement.  CODE STATUS: full  TOTAL TIME TAKING CARE OF THIS PATIENT: 22 minutes.     POSSIBLE D/C 2-4 days, DEPENDING ON CLINICAL CONDITION.   Layce Sprung M.D on 12/31/2017 at 10:01 AM  Between 7am to 6pm - Pager - [phone] After 6pm go to [email or web address] - password [email or web address] [facility]  Office  [phone]  CC: Primary care physician; Glendon Axe, MD  Note: This dictation was prepared with Dragon dictation along with smaller phrase technology. Any transcriptional errors that result from this process are unintentional.

## 2018-01-01 ENCOUNTER — Encounter: Payer: Self-pay | Admitting: Anesthesiology

## 2018-01-01 ENCOUNTER — Inpatient Hospital Stay: Payer: Medicare Other

## 2018-01-01 LAB — BASIC METABOLIC PANEL
Anion gap: 12 (ref 5–15)
BUN: 32 mg/dL — ABNORMAL HIGH (ref 6–20)
CALCIUM: 8.5 mg/dL — AB (ref 8.9–10.3)
CHLORIDE: 105 mmol/L (ref 101–111)
CO2: 25 mmol/L (ref 22–32)
CREATININE: 1.04 mg/dL — AB (ref 0.44–1.00)
GFR calc non Af Amer: 54 mL/min — ABNORMAL LOW (ref >60)
Glucose, Bld: 85 mg/dL (ref 65–99)
Potassium: 3.2 mmol/L — ABNORMAL LOW (ref 3.5–5.1)
SODIUM: 142 mmol/L (ref 135–145)

## 2018-01-01 LAB — CBC WITH DIFFERENTIAL/PLATELET
BASOS PCT: 1 %
Basophils Absolute: 0 10*3/uL (ref 0–0.1)
EOS ABS: 0.1 10*3/uL (ref 0–0.7)
EOS PCT: 2 %
HCT: 38.3 % (ref 35.0–47.0)
HEMOGLOBIN: 13.2 g/dL (ref 12.0–16.0)
Lymphocytes Relative: 19 %
Lymphs Abs: 1 10*3/uL (ref 1.0–3.6)
MCH: 32.8 pg (ref 26.0–34.0)
MCHC: 34.5 g/dL (ref 32.0–36.0)
MCV: 95 fL (ref 80.0–100.0)
MONOS PCT: 11 %
Monocytes Absolute: 0.6 10*3/uL (ref 0.2–0.9)
NEUTROS PCT: 67 %
Neutro Abs: 3.6 10*3/uL (ref 1.4–6.5)
PLATELETS: 196 10*3/uL (ref 150–440)
RBC: 4.04 MIL/uL (ref 3.80–5.20)
RDW: 12.4 % (ref 11.5–14.5)
WBC: 5.4 10*3/uL (ref 3.6–11.0)

## 2018-01-01 LAB — MAGNESIUM: Magnesium: 2.3 mg/dL (ref 1.7–2.4)

## 2018-01-01 LAB — PROTIME-INR
INR: 1.01
PROTHROMBIN TIME: 13.2 s (ref 11.4–15.2)

## 2018-01-01 MED ORDER — POTASSIUM CHLORIDE 10 MEQ/100ML IV SOLN
10.0000 meq | INTRAVENOUS | Status: AC
  Administered 2018-01-01: 10 meq via INTRAVENOUS
  Filled 2018-01-01 (×2): qty 100

## 2018-01-01 NOTE — Progress Notes (Addendum)
[place] [facility] [place] NAME: Carly Khan    MR#:  [id number]  DATE OF BIRTH:  21-Mar-1951  SUBJECTIVE:   Patient burped this am no abdominal pain  REVIEW OF SYSTEMS:    Review of Systems  Constitutional: Negative for fever, chills weight loss HENT: Negative for ear pain, nosebleeds, congestion, facial swelling, rhinorrhea, neck pain, neck stiffness and ear discharge.   Respiratory: Negative for cough, shortness of breath, wheezing  Cardiovascular: Negative for chest pain, palpitations and leg swelling.  Gastrointestinal: Negative for heartburn, abdominal pain, vomiting, diarrhea or consitpation Genitourinary: Negative for dysuria, urgency, frequency, hematuria Musculoskeletal: Negative for back pain or joint pain Neurological: Negative for dizziness, seizures, syncope, focal weakness,  numbness and headaches.  Hematological: Does not bruise/bleed easily.  Psychiatric/Behavioral: Negative for hallucinations, confusion, dysphoric mood    Tolerating Diet: Nothing by mouth      DRUG ALLERGIES:   Allergies  Allergen Reactions  . Penicillin G Hives and Itching    Has patient had a PCN reaction causing immediate rash, facial/tongue/throat swelling, SOB or lightheadedness with hypotension: Yes Has patient had a PCN reaction causing severe rash involving mucus membranes or skin necrosis: No Has patient had a PCN reaction that required hospitalization: No Has patient had a PCN reaction occurring within the last 10 years: Yes If all of the above answers are "NO", then may proceed with Cephalosporin use.  . Succinylcholine     Patient's daughter had emergency surgery and was awake for the entire procedure, was told she should not have succ's and neither should her mom     VITALS:  Blood pressure (!) 144/70, pulse 70, temperature 97.9 F (36.6 C), temperature source Oral, resp. rate 19, height 5\' 4"  (1.626 m), weight 68.1 kg (150 lb 1.6 oz),  SpO2 96 %.  PHYSICAL EXAMINATION:  Constitutional: Appears well-developed and well-nourished. No distress. HENT: Normocephalic. .NGT placed Eyes: Conjunctivae and EOM are normal. PERRLA, no scleral icterus.  Neck: Normal ROM. Neck supple. No JVD. No tracheal deviation. CVS: RRR, S1/S2 +, no murmurs, no gallops, no carotid bruit.  Pulmonary: Effort and breath sounds normal, no stridor, rhonchi, wheezes, rales.  Abdominal: Soft. Hypoactive bowel sounds She has ostomy no distension, tenderness, rebound or guarding.  Hernia Musculoskeletal: Normal range of motion. No edema and no tenderness.  Neuro: Alert. CN 2-12 grossly intact. No focal deficits. Skin: Skin is warm and dry. No rash noted. Psychiatric: Normal mood and affect.      LABORATORY PANEL:   CBC Recent Labs  Lab 01/01/18 0737  WBC 5.4  HGB 13.2  HCT 38.3  PLT 196   ------------------------------------------------------------------------------------------------------------------  Chemistries  Recent Labs  Lab 12/28/17 2028  01/01/18 0737  NA 134*   < > 142  K 4.5   < > 3.2*  CL 96*   < > 105  CO2 21*   < > 25  GLUCOSE 211*   < > 85  BUN 46*   < > 32*  CREATININE 2.05*   < > 1.04*  CALCIUM 10.1   < > 8.5*  MG  --    < > 2.3  AST 37  --   --   ALT 24  --   --   ALKPHOS 87  --   --   BILITOT 2.0*  --   --    < > = values in this interval not displayed.   ------------------------------------------------------------------------------------------------------------------  Cardiac Enzymes Recent Labs  Lab 12/26/17  2039 12/29/17 0258 12/29/17 0831  TROPONINI 0.03* 0.03* 0.03*   ------------------------------------------------------------------------------------------------------------------  RADIOLOGY:  Dg Abd 1 View  Result Date: 01/01/2018 CLINICAL DATA:  Followup small bowel obstruction. EXAM: ABDOMEN - 1 VIEW COMPARISON:  12/31/2017 FINDINGS: There is persistent small bowel dilation. Contrast is seen  within a decompressed right colon. Nasogastric tube is well positioned, tip projecting in the mid to distal stomach. There is a bowel anastomosis staple line in the central pelvis. IMPRESSION: 1. No significant change in the partial small bowel obstruction when compared to the prior study. Electronically Signed   By: Lajean Manes M.D.   On: 01/01/2018 10:18   Dg Abd 1 View  Result Date: 12/31/2017 CLINICAL DATA:  Small bowel obstruction. EXAM: ABDOMEN - 1 VIEW COMPARISON:  Abdominal x-ray from same day at 7:38 a.m. FINDINGS: Enteric tube with the tip in the second portion of the duodenum. Mildly dilated loops of small bowel in the central abdomen are again noted. Contrast is seen within the right colon and rectum, similar to prior study. Small bowel loops are again seen within the right anterolateral abdominal wall at the site of known Spigelian hernia. IMPRESSION: Persistently dilated loops of small bowel secondary to known Spigelian hernia. Contrast within the colon suggests the obstruction is partial. Electronically Signed   By: Titus Dubin M.D.   On: 12/31/2017 19:19   Dg Abd 1 View  Result Date: 12/31/2017 CLINICAL DATA:  Small bowel obstruction. EXAM: ABDOMEN - 1 VIEW COMPARISON:  Lower chest and upper abdominal x-ray of December 29, 2016. Abdominal and pelvic CT scan of December 28, 2017. FINDINGS: There remain loops of mildly distended gas-filled small bowel in the upper abdomen. Less distended small bowel loops are noted in the pelvis. There is contrast within normal caliber right colon and rectum. There is gas and soft tissue density in the right inguinal region consistent with a known inguinal hernia. There surgical clips in the gallbladder fossa. The esophagogastric tube tip projects in the region of the descending duodenum with the proximal port at approximately the level of the duodenal bulb. There is multilevel degenerative disc disease of the lumbar spine. IMPRESSION: Findings compatible  with a mid to distal small bowel obstruction possibly secondary to a known right inguinal hernia. No free extraluminal gas collections are observed. Electronically Signed   By: David  Martinique M.D.   On: 12/31/2017 08:01     ASSESSMENT AND PLAN:   67 year old female with history of chronic kidney disease stage III, diverticulitis status post colostomy and CAD who presents with abdominal pain and nausea.  1. Small bowel obstruction: Management as per surgery Patient may need to go to OR today. She is tentatively planned for this.   2. PAF: Patient currently in sinus rhythm When she is taking oral medications recommendations are to restart anticoagulation  3. 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 Essential hypertension: Currently on metoprolol  5. Hypokalemia: Replete 6. Escherichia coli UTI: Continue Rocephin    Management plans discussed with the patient and she is in agreement.  CODE STATUS: full  TOTAL TIME TAKING CARE OF THIS PATIENT: 20 minutes.     POSSIBLE D/C ?? days, DEPENDING ON CLINICAL CONDITION.   Abrianna Sidman M.D on 01/01/2018 at 12:10 PM  Between 7am to 6pm - Pager - [phone] After 6pm go to [email or web address] - password [email or web address] [facility]  Office  [phone]  CC: Primary care physician; Glendon Axe, MD  Note:  This dictation was prepared with Dragon dictation along with smaller phrase technology. Any transcriptional errors that result from this process are unintentional.

## 2018-01-01 NOTE — Progress Notes (Signed)
01/01/2018  Subjective: Patient had gastrograffin challenge yesterday.  KUB read incorrect as she has a diverting loop ileostomy.  Contrast seen in colon is from barium enema that was done last year.  Also, NG tube was in 2nd portion of duodenum.  To correct the read, the patient does not have a spigelian hernia, and also does not have an inguinal hernia.  This morning, she had some liquid stool emptied from her ostomy bag and is now leaking around the bag dressing.  Reports improved pain.  Vital signs: Temp:  [97.9 F (36.6 C)-98.5 F (36.9 C)] 97.9 F (36.6 C) (03/01 0408) Pulse Rate:  [63-70] 70 (03/01 0408) Resp:  [18-19] 19 (03/01 0408) BP: (137-164)/(56-70) 144/70 (03/01 0408) SpO2:  [96 %-97 %] 96 % (03/01 0408)   Intake/Output: 02/28 0701 - 03/01 0700 In: 890 [I.V.:800; NG/GT:90] Out: 3000 [Urine:300; Emesis/NG output:2700] Last BM Date: (Colostomy)  Physical Exam: Constitutional: No acute distress Abdomen:  Soft, nondistended, nontender to palpation.  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  Liquid green stool was leaking from inferior portion of ostomy appliance.  Labs:  Recent Labs    01/01/18 0737  WBC 5.4  HGB 13.2  HCT 38.3  PLT 196   Recent Labs    12/31/17 0403 01/01/18 0737  NA 141 142  K 3.7 3.2*  CL 106 105  CO2 25 25  GLUCOSE 87 85  BUN 45* 32*  CREATININE 1.22* 1.04*  CALCIUM 8.3* 8.5*   Recent Labs    01/01/18 0737  LABPROT 13.2  INR 1.01    Imaging: Dg Abd 1 View  Result Date: 12/31/2017 CLINICAL DATA:  Small bowel obstruction. EXAM: ABDOMEN - 1 VIEW COMPARISON:  Abdominal x-ray from same day at 7:38 a.m. FINDINGS: Enteric tube with the tip in the second portion of the duodenum. Mildly dilated loops of small bowel in the central abdomen are again noted. Contrast is seen within the right colon and rectum, similar to prior study. Small bowel loops are again seen within the right anterolateral  abdominal wall at the site of known Spigelian hernia. IMPRESSION: Persistently dilated loops of small bowel secondary to known Spigelian hernia. Contrast within the colon suggests the obstruction is partial. Electronically Signed   By: Titus Dubin M.D.   On: 12/31/2017 19:19    Assessment/Plan: 67 yo female with small bowel obstruction.  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  She does not have a Spigelian hernia and she does not have an inguinal hernia as mentioned in previous KUBs obtained on 12/31/17 --NG tube pulled back at bedside to 62 cm and retaped.  Will obtain new KUB this morning to evaluate tube placement and also to check her small bowel dilation since now she's having some ostomy output --have added her to the OR schedule as a precaution for this afternoon, but if she's having ostomy function and her bowels look better, would cancel case.  Patient is aware of this plan and all of her questions have been answered.   Melvyn Neth, [place]

## 2018-01-01 NOTE — Progress Notes (Signed)
Obtained supplies for ostomy wafer and bag change. Assisted patient to change. Stoma is pink/reddish

## 2018-01-01 NOTE — Care Management Note (Signed)
Case Management Note  Patient Details  Name: Carly Khan MRN: [id number] Date of Birth: 01-Nov-1951  Subjective/Objective:  Met with patient and daughter at bedside to discuss financial concerns. Patient states she is having difficulty paying for her medications. She has a prescription policy but has a deductible of what she thinks is around $1800.  She also has difficulty getting to and from appointments. She has a lady that comes every Wednesday to take her to appointments. List given for local transporation agencies like [employer] and [employer]. Medication management is not a resource that can help. Will follow and assist as needed.                    Action/Plan:    Expected Discharge Date:                  Expected Discharge Plan:     In-House Referral:     Discharge planning Services  CM Consult  Post Acute Care Choice:    Choice offered to:     DME Arranged:    DME Agency:     HH Arranged:    HH Agency:     Status of Service:  In process, will continue to follow  If discussed at Long Length of Stay Meetings, dates discussed:    Additional Comments:  Jolly Mango, RN 01/01/2018, 2:34 PM

## 2018-01-01 NOTE — Progress Notes (Signed)
01/01/18 4:05 pm  Patient had 200 ml recorded ostomy output this morning and then has more liquid output in bag this afternoon.  NG output more clear with gastric contents now that it's in better position.  Denies any significant pain and on exam, her hernia continues to stay reduced.  At this point there is no time availability in the OR today to proceed, and no assistance to help with case tomorrow.  She has been tentatively scheduled for Sunday 3/3 at 9 am, should her SBO not resolve.  However, there is some reassurance with her ostomy starting to work today.   Continue NPO with NG tube to suction.  Will continue to follow with you.     Melvyn Neth, [place] 7a-7p: [place] 7p-7a: [phone]

## 2018-01-01 NOTE — Progress Notes (Signed)
text Dr. Estanislado Pandy to clarify order for NS. Order has expired but pt. Is NPO and has NG tube. Awaiting call back.

## 2018-01-02 LAB — BASIC METABOLIC PANEL
Anion gap: 13 (ref 5–15)
BUN: 23 mg/dL — ABNORMAL HIGH (ref 6–20)
CALCIUM: 8.6 mg/dL — AB (ref 8.9–10.3)
CO2: 25 mmol/L (ref 22–32)
CREATININE: 0.91 mg/dL (ref 0.44–1.00)
Chloride: 103 mmol/L (ref 101–111)
GFR calc Af Amer: >60 mL/min (ref >60)
GFR calc non Af Amer: >60 mL/min (ref >60)
GLUCOSE: 98 mg/dL (ref 65–99)
Potassium: 3.5 mmol/L (ref 3.5–5.1)
Sodium: 141 mmol/L (ref 135–145)

## 2018-01-02 MED ORDER — SODIUM CHLORIDE 0.9 % IV SOLN
INTRAVENOUS | Status: DC
  Administered 2018-01-02 – 2018-01-03 (×2): via INTRAVENOUS
  Administered 2018-01-03: 75 mL/h via INTRAVENOUS

## 2018-01-02 NOTE — Progress Notes (Signed)
01/02/18 6:00 pm  Patient passed NG tube clamping trial and NG tube was removed today.  She denies any nausea and has been tolerating ice chips and sips of water.  Ostomy still functioning with liquid stool and some gas in bag.  Discussed with patient that she does not require surgery at this time and will cancel her case tomorrow.  Will start her on clear liquid diet tomorrow morning.   Melvyn Neth, [place] 7a-7p: [place] 7p-7a: [phone]

## 2018-01-02 NOTE — Progress Notes (Signed)
Dr. Duane Boston, returned page, new order for NS at 39ml hr.

## 2018-01-02 NOTE — Progress Notes (Signed)
[place] at [place] NAME: Nathaly Dawkins    MR#:  [id number]  DATE OF BIRTH:  11-Apr-1951  SUBJECTIVE:   Patient is having output from ostomy. Patient denies abdominal pain. Patient is having bowel sounds today.  REVIEW OF SYSTEMS:    Review of Systems  Constitutional: Negative for fever, chills weight loss HENT: Negative for ear pain, nosebleeds, congestion, facial swelling, rhinorrhea, neck pain, neck stiffness and ear discharge.   Respiratory: Negative for cough, shortness of breath, wheezing  Cardiovascular: Negative for chest pain, palpitations and leg swelling.  Gastrointestinal: Negative for heartburn, abdominal pain, vomiting, diarrhea or consitpation Genitourinary: Negative for dysuria, urgency, frequency, hematuria Musculoskeletal: Negative for back pain or joint pain Neurological: Negative for dizziness, seizures, syncope, focal weakness,  numbness and headaches.  Hematological: Does not bruise/bleed easily.  Psychiatric/Behavioral: Negative for hallucinations, confusion, dysphoric mood    Tolerating Diet: Nothing by mouth      DRUG ALLERGIES:   Allergies  Allergen Reactions  . Penicillin G Hives and Itching    Has patient had a PCN reaction causing immediate rash, facial/tongue/throat swelling, SOB or lightheadedness with hypotension: Yes Has patient had a PCN reaction causing severe rash involving mucus membranes or skin necrosis: No Has patient had a PCN reaction that required hospitalization: No Has patient had a PCN reaction occurring within the last 10 years: Yes If all of the above answers are "NO", then may proceed with Cephalosporin use.  . Succinylcholine     Patient's daughter had emergency surgery and was awake for the entire procedure, was told she should not have succ's and neither should her mom     VITALS:  Blood pressure (!) 166/66, pulse 72, temperature 98.3 F (36.8 C), temperature source Oral,  resp. rate 18, height 5\' 4"  (1.626 m), weight 68.1 kg (150 lb 1.6 oz), SpO2 96 %.  PHYSICAL EXAMINATION:  Constitutional: Appears well-developed and well-nourished. No distress. HENT: Normocephalic. .NGT placed Eyes: Conjunctivae and EOM are normal. PERRLA, no scleral icterus.  Neck: Normal ROM. Neck supple. No JVD. No tracheal deviation. CVS: RRR, S1/S2 +, no murmurs, no gallops, no carotid bruit.  Pulmonary: Effort and breath sounds normal, no stridor, rhonchi, wheezes, rales.  Abdominal: Soft nontender. Good bowel sounds. She has ostomy with output no distension, tenderness, rebound or guarding.  Hernia Musculoskeletal: Normal range of motion. No edema and no tenderness.  Neuro: Alert. CN 2-12 grossly intact. No focal deficits. Skin: Skin is warm and dry. No rash noted. Psychiatric: Normal mood and affect.      LABORATORY PANEL:   CBC Recent Labs  Lab 01/01/18 0737  WBC 5.4  HGB 13.2  HCT 38.3  PLT 196   ------------------------------------------------------------------------------------------------------------------  Chemistries  Recent Labs  Lab 12/28/17 2028  01/01/18 0737 01/02/18 0357  NA 134*   < > 142 141  K 4.5   < > 3.2* 3.5  CL 96*   < > 105 103  CO2 21*   < > 25 25  GLUCOSE 211*   < > 85 98  BUN 46*   < > 32* 23*  CREATININE 2.05*   < > 1.04* 0.91  CALCIUM 10.1   < > 8.5* 8.6*  MG  --    < > 2.3  --   AST 37  --   --   --   ALT 24  --   --   --   ALKPHOS 87  --   --   --  BILITOT 2.0*  --   --   --    < > = values in this interval not displayed.   ------------------------------------------------------------------------------------------------------------------  Cardiac Enzymes Recent Labs  Lab 12/26/17 2039 12/29/17 0258 12/29/17 0831  TROPONINI 0.03* 0.03* 0.03*   ------------------------------------------------------------------------------------------------------------------  RADIOLOGY:  Dg Abd 1 View  Result Date:  01/01/2018 CLINICAL DATA:  Followup small bowel obstruction. EXAM: ABDOMEN - 1 VIEW COMPARISON:  12/31/2017 FINDINGS: There is persistent small bowel dilation. Contrast is seen within a decompressed right colon. Nasogastric tube is well positioned, tip projecting in the mid to distal stomach. There is a bowel anastomosis staple line in the central pelvis. IMPRESSION: 1. No significant change in the partial small bowel obstruction when compared to the prior study. Electronically Signed   By: Lajean Manes M.D.   On: 01/01/2018 10:18   Dg Abd 1 View  Result Date: 12/31/2017 CLINICAL DATA:  Small bowel obstruction. EXAM: ABDOMEN - 1 VIEW COMPARISON:  Abdominal x-ray from same day at 7:38 a.m. FINDINGS: Enteric tube with the tip in the second portion of the duodenum. Mildly dilated loops of small bowel in the central abdomen are again noted. Contrast is seen within the right colon and rectum, similar to prior study. Small bowel loops are again seen within the right anterolateral abdominal wall at the site of known Spigelian hernia. IMPRESSION: Persistently dilated loops of small bowel secondary to known Spigelian hernia. Contrast within the colon suggests the obstruction is partial. Electronically Signed   By: Titus Dubin M.D.   On: 12/31/2017 19:19     ASSESSMENT AND PLAN:   67 year old female with history of chronic kidney disease stage III, diverticulitis status post colostomy and CAD who presents with abdominal pain and nausea.  1. Small bowel obstruction: Management as per surgery Patient doing well and may not need to go to surgery.   2. PAF: Patient currently in sinus rhythm When she is taking oral medications recommendations are to restart anticoagulation  3. 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 Essential hypertension: Currently on metoprolol  5. Hypokalemia: Repleted 6. Escherichia coli UTI: Continue Rocephin treat for a total 5  days.    Management plans discussed with the patient and she is in agreement.  CODE STATUS: full  TOTAL TIME TAKING CARE OF THIS PATIENT: 20 minutes.     POSSIBLE D/C ?? days, DEPENDING ON CLINICAL CONDITION.   Jaydan Chretien M.D on 01/02/2018 at 11:13 AM  Between 7am to 6pm - Pager - [phone] After 6pm go to [email or web address] - password [email or web address] [facility]  Office  [phone]  CC: Primary care physician; Glendon Axe, MD  Note: This dictation was prepared with Dragon dictation along with smaller phrase technology. Any transcriptional errors that result from this process are unintentional.

## 2018-01-02 NOTE — Progress Notes (Signed)
01/02/2018  Subjective: No acute events overnight.  Patient started having some ostomy output yesterday and had more output overnight.  NG output has decreased as well overnight.  Denies any nausea and does feel "rumbling" in her abdomen.  Vital signs: Temp:  [98.1 F (36.7 C)-98.8 F (37.1 C)] 98.3 F (36.8 C) (03/02 0414) Pulse Rate:  [72-77] 72 (03/02 0414) Resp:  [18] 18 (03/01 1700) BP: (151-166)/(66-86) 166/66 (03/02 0414) SpO2:  [96 %-100 %] 96 % (03/02 0414)   Intake/Output: 03/01 0701 - 03/02 0700 In: 522.5 [I.V.:322.5; IV Piggyback:200] Out: 1300 [Urine:200; Emesis/NG output:1100] Last BM Date: 01/01/18  Physical Exam: Constitutional: No acute distress Abdomen:  Soft, nondistended, nontender to palpation.  NG tube with gastric contents in canister.  Ostomy with green liquid stool in bag.  Parastomal hernia only minimally bulging and very easily reduced without further discomfort.  Labs:  Recent Labs    01/01/18 0737  WBC 5.4  HGB 13.2  HCT 38.3  PLT 196   Recent Labs    01/01/18 0737 01/02/18 0357  NA 142 141  K 3.2* 3.5  CL 105 103  CO2 25 25  GLUCOSE 85 98  BUN 32* 23*  CREATININE 1.04* 0.91  CALCIUM 8.5* 8.6*   Recent Labs    01/01/18 0737  LABPROT 13.2  INR 1.01    Imaging: Dg Abd 1 View  Result Date: 01/01/2018 CLINICAL DATA:  Followup small bowel obstruction. EXAM: ABDOMEN - 1 VIEW COMPARISON:  12/31/2017 FINDINGS: There is persistent small bowel dilation. Contrast is seen within a decompressed right colon. Nasogastric tube is well positioned, tip projecting in the mid to distal stomach. There is a bowel anastomosis staple line in the central pelvis. IMPRESSION: 1. No significant change in the partial small bowel obstruction when compared to the prior study. Electronically Signed   By: Lajean Manes M.D.   On: 01/01/2018 10:18    Assessment/Plan: 67 yo female with small bowel obstruction and UTI  --will do NG clamp trial this morning and  check residual at 12:30 pm.  Discussed with RN that NG may be removed if residual < 150 ml. --If NG removed, may start sips/ice chips today --Currently patient is posted for OR tomorrow Sunday.  Will keep in schedule as a precaution, though having ostomy output and less NG output is very reassuring.   Melvyn Neth, [place]

## 2018-01-02 NOTE — Progress Notes (Signed)
Patient's NG tube connected back to suction for 30 min.  NO OUTPUT.  NG tube to be pulled and patient will be NPO except for sips with meds and ice chips.

## 2018-01-03 ENCOUNTER — Encounter
Admission: EM | Disposition: A | Discharge status: Home Health | Payer: Self-pay | Source: Home / Self Care | Attending: Internal Medicine

## 2018-01-03 LAB — PROTIME-INR
INR: 0.99
Prothrombin Time: 13 seconds (ref 11.4–15.2)

## 2018-01-03 SURGERY — LAPAROTOMY, EXPLORATORY
Anesthesia: Choice

## 2018-01-03 MED ORDER — WARFARIN SODIUM 5 MG PO TABS
5.0000 mg | ORAL_TABLET | Freq: Once | ORAL | Status: DC
  Filled 2018-01-03: qty 1

## 2018-01-03 MED ORDER — APIXABAN 5 MG PO TABS: 5.0000 mg | ORAL_TABLET | Freq: Two times a day (BID) | ORAL | Status: DC

## 2018-01-03 MED ORDER — WARFARIN - PHARMACIST DOSING INPATIENT: Freq: Every day | Status: DC

## 2018-01-03 NOTE — Progress Notes (Signed)
[place] at [place] NAME: Carly Khan    MR#:  [id number]  DATE OF BIRTH:  Feb 19, 1951  SUBJECTIVE:   Patient had large explosion from ostomy bag yesterday. Denies abdominal pain, nausea or vomiting.  REVIEW OF SYSTEMS:    Review of Systems  Constitutional: Negative for fever, chills weight loss HENT: Negative for ear pain, nosebleeds, congestion, facial swelling, rhinorrhea, neck pain, neck stiffness and ear discharge.   Respiratory: Negative for cough, shortness of breath, wheezing  Cardiovascular: Negative for chest pain, palpitations and leg swelling.  Gastrointestinal: Negative for heartburn, abdominal pain, vomiting, diarrhea or consitpation Genitourinary: Negative for dysuria, urgency, frequency, hematuria Musculoskeletal: Negative for back pain or joint pain Neurological: Negative for dizziness, seizures, syncope, focal weakness,  numbness and headaches.  Hematological: Does not bruise/bleed easily.  Psychiatric/Behavioral: Negative for hallucinations, confusion, dysphoric mood    Tolerating Diet: Nothing by mouth      DRUG ALLERGIES:   Allergies  Allergen Reactions  . Penicillin G Hives and Itching    Has patient had a PCN reaction causing immediate rash, facial/tongue/throat swelling, SOB or lightheadedness with hypotension: Yes Has patient had a PCN reaction causing severe rash involving mucus membranes or skin necrosis: No Has patient had a PCN reaction that required hospitalization: No Has patient had a PCN reaction occurring within the last 10 years: Yes If all of the above answers are "NO", then may proceed with Cephalosporin use.  . Succinylcholine     Patient's daughter had emergency surgery and was awake for the entire procedure, was told she should not have succ's and neither should her mom     VITALS:  Blood pressure 125/61, pulse 73, temperature 98.7 F (37.1 C), resp. rate 20, height 5\' 4"  (1.626 m),  weight 68.1 kg (150 lb 1.6 oz), SpO2 95 %.  PHYSICAL EXAMINATION:  Constitutional: Appears well-developed and well-nourished. No distress. HENT: Normocephalic. .NGT placed Eyes: Conjunctivae and EOM are normal. PERRLA, no scleral icterus.  Neck: Normal ROM. Neck supple. No JVD. No tracheal deviation. CVS: RRR, S1/S2 +, no murmurs, no gallops, no carotid bruit.  Pulmonary: Effort and breath sounds normal, no stridor, rhonchi, wheezes, rales.  Abdominal: Soft nontender. Good bowel sounds. She has ostomy with good output no distension, tenderness, rebound or guarding.  Hernia Musculoskeletal: Normal range of motion. No edema and no tenderness.  Neuro: Alert. CN 2-12 grossly intact. No focal deficits. Skin: Skin is warm and dry. No rash noted. Psychiatric: Normal mood and affect.      LABORATORY PANEL:   CBC Recent Labs  Lab 01/01/18 0737  WBC 5.4  HGB 13.2  HCT 38.3  PLT 196   ------------------------------------------------------------------------------------------------------------------  Chemistries  Recent Labs  Lab 12/28/17 2028  01/01/18 0737 01/02/18 0357  NA 134*   < > 142 141  K 4.5   < > 3.2* 3.5  CL 96*   < > 105 103  CO2 21*   < > 25 25  GLUCOSE 211*   < > 85 98  BUN 46*   < > 32* 23*  CREATININE 2.05*   < > 1.04* 0.91  CALCIUM 10.1   < > 8.5* 8.6*  MG  --    < > 2.3  --   AST 37  --   --   --   ALT 24  --   --   --   ALKPHOS 87  --   --   --   BILITOT  2.0*  --   --   --    < > = values in this interval not displayed.   ------------------------------------------------------------------------------------------------------------------  Cardiac Enzymes Recent Labs  Lab 12/29/17 0258 12/29/17 0831  TROPONINI 0.03* 0.03*   ------------------------------------------------------------------------------------------------------------------  RADIOLOGY:  Dg Abd 1 View  Result Date: 01/01/2018 CLINICAL DATA:  Followup small bowel obstruction. EXAM:  ABDOMEN - 1 VIEW COMPARISON:  12/31/2017 FINDINGS: There is persistent small bowel dilation. Contrast is seen within a decompressed right colon. Nasogastric tube is well positioned, tip projecting in the mid to distal stomach. There is a bowel anastomosis staple line in the central pelvis. IMPRESSION: 1. No significant change in the partial small bowel obstruction when compared to the prior study. Electronically Signed   By: Carly Khan M.D.   On: 01/01/2018 10:18     ASSESSMENT AND PLAN:   67 year old female with history of chronic kidney disease stage III, diverticulitis status post colostomy and CAD who presents with abdominal pain and nausea.  1. Small bowel obstruction:Patient doing well and will not need to go to surgery. Clear liquid diet and advance as tolerated  2. PAF: Patient currently in sinus rhythm Continue metoprolol and start Eliquis  3. 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 Essential hypertension: Currently on metoprolol  5. Hypokalemia: Repleted 6. Escherichia coli UTI: Stop Rocephin today   Management plans discussed with the patient and she is in agreement.  CODE STATUS: full  TOTAL TIME TAKING CARE OF THIS PATIENT: 20 minutes.     POSSIBLE D/C TOMORROW DEPENDING ON CLINICAL CONDITION.   Carly Khan M.D on 01/03/2018 at 7:58 AM  Between 7am to 6pm - Pager - [phone] After 6pm go to [email or web address] - password [email or web address] [facility]  Office  [phone]  CC: Primary care physician; Carly Axe, MD  Note: This dictation was prepared with Dragon dictation along with smaller phrase technology. Any transcriptional errors that result from this process are unintentional.

## 2018-01-03 NOTE — Progress Notes (Deleted)
ANTICOAGULATION CONSULT NOTE - Initial Consult  Pharmacy Consult for Apixaban Indication: atrial fibrillation  Allergies  Allergen Reactions  . Penicillin G Hives and Itching    Has patient had a PCN reaction causing immediate rash, facial/tongue/throat swelling, SOB or lightheadedness with hypotension: Yes Has patient had a PCN reaction causing severe rash involving mucus membranes or skin necrosis: No Has patient had a PCN reaction that required hospitalization: No Has patient had a PCN reaction occurring within the last 10 years: Yes If all of the above answers are "NO", then may proceed with Cephalosporin use.  . Succinylcholine     Patient's daughter had emergency surgery and was awake for the entire procedure, was told she should not have succ's and neither should her mom     Patient Measurements: Height: 5\' 4"  (162.6 cm) Weight: 150 lb 1.6 oz (68.1 kg) IBW/kg (Calculated) : 54.7  Vital Signs: Temp: 98.7 F (37.1 C) (03/03 0301) BP: 125/61 (03/03 0301) Pulse Rate: 73 (03/03 0301)  Labs: Recent Labs    01/01/18 0737 01/02/18 0357  HGB 13.2  --   HCT 38.3  --   PLT 196  --   LABPROT 13.2  --   INR 1.01  --   CREATININE 1.04* 0.91    Estimated Creatinine Clearance: 56.1 mL/min (by C-G formula based on SCr of 0.91 mg/dL).   Medical History: Past Medical History:  Diagnosis Date  . CAD in native artery 1991   a. s/p CABG in 1991 in [place]; b. 06/2017 MV: EF 72%, no ischemia/infarct-->Low risk.  . Cancer of left lung ([place]) 2016   a. 12/2015 LUL lung resection.  . Chronic Dyspnea on exertion   . CKD (chronic kidney disease) stage 3, GFR 30-59 ml/min ([place]) 04/09/2017   a. 04/2017 ARF & Hyperkalemia req HD x 1.  . 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 Diverticulitis   . Essential hypertension 04/09/2017  . Family history of adverse reaction to anesthesia   . History of sebaceous cyst 02/28/2017  . History of stress test   . Intermittent vertigo 04/09/2017  . Itching 02/28/2017   . Left Atrial Appendage Thrombus    a. Incidentally noted on CT 04/2017;  b. 04/2017 Echo: EF 65-70%, Gr1 DD, no LA filling defect seen; c. 05/2017 TEE: EF 60-65%, no RA/LA thrombus; d. Later found to have PAF on Event monitor-->chronic Eliquis.  Marland Kitchen PAF (paroxysmal atrial fibrillation) ([place])    a. Discovered 06/2017 on Event monitor as part of w/u for LAA thrombus. CHA2DS2VASc = 4-->Eliquis.    Medications:  Scheduled:  . apixaban  5 mg Oral BID  . fluticasone furoate-vilanterol  1 puff Inhalation BID  . metoprolol succinate  25 mg Oral Daily  . pravastatin  10 mg Oral QHS    Assessment: MD order to start Eliquis in this 67 yo female for PAF: Patient currently in sinus rhythm.  PTA: Eliquis 5mg  PO BID   Plan:  Apixaban 5mg  PO BID ordered as per home dose.  Olivia Canter, [facility] 01/03/2018,8:06 AM

## 2018-01-03 NOTE — Progress Notes (Addendum)
ANTICOAGULATION CONSULT NOTE - Initial Consult  Pharmacy Consult for Warfarin Indication: atrial fibrillation  Allergies  Allergen Reactions  . Penicillin G Hives and Itching    Has patient had a PCN reaction causing immediate rash, facial/tongue/throat swelling, SOB or lightheadedness with hypotension: Yes Has patient had a PCN reaction causing severe rash involving mucus membranes or skin necrosis: No Has patient had a PCN reaction that required hospitalization: No Has patient had a PCN reaction occurring within the last 10 years: Yes If all of the above answers are "NO", then may proceed with Cephalosporin use.  . Succinylcholine     Patient's daughter had emergency surgery and was awake for the entire procedure, was told she should not have succ's and neither should her mom     Patient Measurements: Height: 5\' 4"  (162.6 cm) Weight: 150 lb 1.6 oz (68.1 kg) IBW/kg (Calculated) : 54.7  Vital Signs: Temp: 98.7 F (37.1 C) (03/03 0301) BP: 131/55 (03/03 0816) Pulse Rate: 61 (03/03 0816)  Labs: Recent Labs    01/01/18 0737 01/02/18 0357  HGB 13.2  --   HCT 38.3  --   PLT 196  --   LABPROT 13.2  --   INR 1.01  --   CREATININE 1.04* 0.91    Estimated Creatinine Clearance: 56.1 mL/min (by C-G formula based on SCr of 0.91 mg/dL).   Medical History: Past Medical History:  Diagnosis Date  . CAD in native artery 1991   a. s/p CABG in 1991 in [place]; b. 06/2017 MV: EF 72%, no ischemia/infarct-->Low risk.  . Cancer of left lung ([place]) 2016   a. 12/2015 LUL lung resection.  . Chronic Dyspnea on exertion   . CKD (chronic kidney disease) stage 3, GFR 30-59 ml/min ([place]) 04/09/2017   a. 04/2017 ARF & Hyperkalemia req HD x 1.  . 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 Diverticulitis   . Essential hypertension 04/09/2017  . Family history of adverse reaction to anesthesia   . History of sebaceous cyst 02/28/2017  . History of stress test   . Intermittent vertigo 04/09/2017  . Itching 02/28/2017   . Left Atrial Appendage Thrombus    a. Incidentally noted on CT 04/2017;  b. 04/2017 Echo: EF 65-70%, Gr1 DD, no LA filling defect seen; c. 05/2017 TEE: EF 60-65%, no RA/LA thrombus; d. Later found to have PAF on Event monitor-->chronic Eliquis.  Marland Kitchen PAF (paroxysmal atrial fibrillation) ([place])    a. Discovered 06/2017 on Event monitor as part of w/u for LAA thrombus. CHA2DS2VASc = 4-->Eliquis.    Medications:  Scheduled:  . fluticasone furoate-vilanterol  1 puff Inhalation BID  . metoprolol succinate  25 mg Oral Daily  . pravastatin  10 mg Oral QHS    Assessment: Begin Warfarin for PAF in this 67 yo female. 3/3  INR = 0.99  Goal of Therapy:  INR = 2-3   Plan:  Warfarin 5mg  tonight. Check INR daily; CBC at least every 3 days.  Olivia Canter, [facility] 01/03/2018,9:02 AM

## 2018-01-03 NOTE — Progress Notes (Addendum)
01/03/2018  Subjective: No acute events.  Patient continues having bowel function.  Denies any nausea or vomiting.  No significant abdominal discomfort.  Vital signs: Temp:  [98.5 F (36.9 C)-98.7 F (37.1 C)] 98.7 F (37.1 C) (03/03 0301) Pulse Rate:  [58-78] 74 (03/03 0911) Resp:  [18-20] 18 (03/03 0816) BP: (125-179)/(55-79) 131/55 (03/03 0816) SpO2:  [94 %-98 %] 95 % (03/03 0816)   Intake/Output: 03/02 0701 - 03/03 0700 In: 1903.8 [I.V.:1803.8; IV Piggyback:100] Out: 1050 [Urine:250; Stool:800] Last BM Date: 01/03/18  Physical Exam: Constitutional: No acute distress Abdomen:  Soft, non-distended, non-tender to palpation.  Ostomy with pink viable mucosa and liquid stool and gas in bag.  Labs:  Recent Labs    01/01/18 0737  WBC 5.4  HGB 13.2  HCT 38.3  PLT 196   Recent Labs    01/01/18 0737 01/02/18 0357  NA 142 141  K 3.2* 3.5  CL 105 103  CO2 25 25  GLUCOSE 85 98  BUN 32* 23*  CREATININE 1.04* 0.91  CALCIUM 8.5* 8.6*   Recent Labs    01/01/18 0737 01/03/18 0931  LABPROT 13.2 13.0  INR 1.01 0.99    Imaging: No results found.  Assessment/Plan: 67 yo female with small bowel obstruction.  --will start clear liquids today.  Possible full liquids tonight for dinner if doing well. --OOB, ambulate --may resume home medications --discussed with Dr. Benjie Karvonen about restarting her anticoagulation. She is not able to afford Eliquis as outpatient and cardiology had mentioned using Coumadin instead. --could potentially be discharged tomorrow if her diet advances well.   Melvyn Neth, [place]

## 2018-01-04 ENCOUNTER — Telehealth: Payer: Self-pay | Admitting: Internal Medicine

## 2018-01-04 DIAGNOSIS — K56609 Unspecified intestinal obstruction, unspecified as to partial versus complete obstruction: Secondary | ICD-10-CM

## 2018-01-04 LAB — PROTIME-INR
INR: 1.03
Prothrombin Time: 13.4 seconds (ref 11.4–15.2)

## 2018-01-04 MED ORDER — WARFARIN SODIUM 5 MG PO TABS: ORAL_TABLET | ORAL | 0 refills | Status: DC

## 2018-01-04 MED ORDER — METOPROLOL SUCCINATE ER 25 MG PO TB24: 25.0000 mg | ORAL_TABLET | Freq: Every day | ORAL | Status: AC

## 2018-01-04 NOTE — Progress Notes (Signed)
Patient is discharge home in a stable condition, summary and f/u care given , verbalized understanding , left with daughter

## 2018-01-04 NOTE — Progress Notes (Signed)
Patient is being discharged home. IV removed by Primary RN. Reviewed meds, scripts, and  last dose given. Discussed follow-up appointments and gave daughter taxi voucher. Allowed time for questions.

## 2018-01-04 NOTE — Discharge Summary (Signed)
[facility] [place] NAME: Carly Khan    MR#:  [id number]  DATE OF BIRTH:  1951-03-11  DATE OF ADMISSION:  12/28/2017 ADMITTING PHYSICIAN: Lance Coon, MD  DATE OF DISCHARGE: 01/04/2018  PRIMARY CARE PHYSICIAN: Glendon Axe, MD    ADMISSION DIAGNOSIS:  Dehydration [E86.0] Lower urinary tract infectious disease [N39.0] Renal insufficiency [N28.9] Non-intractable vomiting with nausea, unspecified vomiting type [R11.2]  DISCHARGE DIAGNOSIS:  Principal Problem:   UTI (urinary tract infection) Active Problems:   CAD in native artery   Essential hypertension   Acute renal failure (HCC)   Paroxysmal atrial fibrillation (HCC)   Hepatic cirrhosis (HCC)   Small bowel obstruction ([place])   SECONDARY DIAGNOSIS:   Past Medical History:  Diagnosis Date  . CAD in native artery 1991   a. s/p CABG in 1991 in [place]; b. 06/2017 MV: EF 72%, no ischemia/infarct-->Low risk.  . Cancer of left lung ([place]) 2016   a. 12/2015 LUL lung resection.  . Chronic Dyspnea on exertion   . CKD (chronic kidney disease) stage 3, GFR 30-59 ml/min ([place]) 04/09/2017   a. 04/2017 ARF & Hyperkalemia req HD x 1.  . 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 Diverticulitis   . Essential hypertension 04/09/2017  . Family history of adverse reaction to anesthesia   . History of sebaceous cyst 02/28/2017  . History of stress test   . Intermittent vertigo 04/09/2017  . Itching 02/28/2017  . Left Atrial Appendage Thrombus    a. Incidentally noted on CT 04/2017;  b. 04/2017 Echo: EF 65-70%, Gr1 DD, no LA filling defect seen; c. 05/2017 TEE: EF 60-65%, no RA/LA thrombus; d. Later found to have PAF on Event monitor-->chronic Eliquis.  Marland Kitchen PAF (paroxysmal atrial fibrillation) ([place])    a. Discovered 06/2017 on Event monitor as part of w/u for LAA thrombus. CHA2DS2VASc = 4-->Eliquis.    HOSPITAL COURSE:  67 year old female with history of chronic kidney disease stage III, diverticulitis status post colostomy  and CAD who presents with abdominal pain and nausea.  1. Small bowel obstruction:she was followed by surgery. Patient is doing quite well without the need for surgery. She is tolerating her diet. She'll have outpatient follow-up with surgery in one week.  2. PAF: Patient was seen and evaluated by cardiology services while patient was in the hospital. She is currently in normal sinus rhythm. Due to inability to afford Eliquis recommendations are to start Coumadin. She will need close follow-up with her PCP for INR check. She will continue metoprolol for heart rate control.   3. 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 Essential hypertension: She will continue metoprolol. Her blood pressure was low normal and therefore losartan and Norvasc has been discontinued for now. If her blood pressure is elevated as an outpatient then these medications should be restarted. 5. Hypokalemia: This was repleted and resolved  6. Escherichia coli UTI:  she completed treatment with Rocephin     DISCHARGE CONDITIONS AND DIET:   sTable for discharge on heart healthy diet  CONSULTS OBTAINED:  Treatment Team:  Olean Ree, MD  DRUG ALLERGIES:   Allergies  Allergen Reactions  . Penicillin G Hives and Itching    Has patient had a PCN reaction causing immediate rash, facial/tongue/throat swelling, SOB or lightheadedness with hypotension: Yes Has patient had a PCN reaction causing severe rash involving mucus membranes or skin necrosis: No Has patient had a PCN reaction that required hospitalization: No Has patient  had a PCN reaction occurring within the last 10 years: Yes If all of the above answers are "NO", then may proceed with Cephalosporin use.  . Succinylcholine     Patient's daughter had emergency surgery and was awake for the entire procedure, was told she should not have succ's and neither should her mom     DISCHARGE MEDICATIONS:   Allergies as of  01/04/2018      Reactions   Penicillin G Hives, Itching   Has patient had a PCN reaction causing immediate rash, facial/tongue/throat swelling, SOB or lightheadedness with hypotension: Yes Has patient had a PCN reaction causing severe rash involving mucus membranes or skin necrosis: No Has patient had a PCN reaction that required hospitalization: No Has patient had a PCN reaction occurring within the last 10 years: Yes If all of the above answers are "NO", then may proceed with Cephalosporin use.   Succinylcholine    Patient's daughter had emergency surgery and was awake for the entire procedure, was told she should not have succ's and neither should her mom      Medication List    STOP taking these medications   amLODipine 5 MG tablet Commonly known as:  NORVASC   apixaban 5 MG Tabs tablet Commonly known as:  ELIQUIS   losartan 50 MG tablet Commonly known as:  COZAAR     TAKE these medications   albuterol 108 (90 Base) MCG/ACT inhaler Commonly known as:  PROVENTIL HFA;VENTOLIN HFA Inhale 2 puffs into the lungs every 6 (six) hours as needed.   aspirin EC 81 MG tablet Take 1 tablet by mouth daily.   BREO ELLIPTA 100-25 MCG/INH Aepb Generic drug:  fluticasone furoate-vilanterol Inhale 1 puff into the lungs 2 (two) times daily.   metoprolol succinate 25 MG 24 hr tablet Commonly known as:  TOPROL-XL Take 1 tablet (25 mg total) by mouth daily.   ondansetron 4 MG disintegrating tablet Commonly known as:  ZOFRAN ODT Take 1 tablet (4 mg total) by mouth every 8 (eight) hours as needed for nausea or vomiting.   pravastatin 10 MG tablet Commonly known as:  PRAVACHOL Take 10 mg by mouth at bedtime.   warfarin 5 MG tablet Commonly known as:  COUMADIN Take 1 tablet daily         Today   CHIEF COMPLAINT:  No acute issues overnight.   VITAL SIGNS:  Blood pressure 126/65, pulse 60, temperature 97.9 F (36.6 C), temperature source Oral, resp. rate 19, height 5\' 4"  (1.626  m), weight 68.1 kg (150 lb 1.6 oz), SpO2 97 %.   REVIEW OF SYSTEMS:  Review of Systems  Constitutional: Negative.  Negative for chills, fever and malaise/fatigue.  HENT: Negative.  Negative for ear discharge, ear pain, hearing loss, nosebleeds and sore throat.   Eyes: Negative.  Negative for blurred vision and pain.  Respiratory: Negative.  Negative for cough, hemoptysis, shortness of breath and wheezing.   Cardiovascular: Negative.  Negative for chest pain, palpitations and leg swelling.  Gastrointestinal: Negative.  Negative for abdominal pain, blood in stool, diarrhea, nausea and vomiting.  Genitourinary: Negative.  Negative for dysuria.  Musculoskeletal: Negative.  Negative for back pain.  Skin: Negative.   Neurological: Negative for dizziness, tremors, speech change, focal weakness, seizures and headaches.  Endo/Heme/Allergies: Negative.  Does not bruise/bleed easily.  Psychiatric/Behavioral: Negative.  Negative for depression, hallucinations and suicidal ideas.     PHYSICAL EXAMINATION:  GENERAL:  67 y.o.-year-old patient lying in the bed with no acute distress.  NECK:  Supple, no jugular venous distention. No thyroid enlargement, no tenderness.  LUNGS: Normal breath sounds bilaterally, no wheezing, rales,rhonchi  No use of accessory muscles of respiration.  CARDIOVASCULAR: S1, S2 normal. No murmurs, rubs, or gallops.  ABDOMEN: Soft, non-tender, non-distended. Bowel sounds present. No organomegaly or mass.  She has colostomy bag EXTREMITIES: No pedal edema, cyanosis, or clubbing.  PSYCHIATRIC: The patient is alert and oriented x 3.  SKIN: No obvious rash, lesion, or ulcer.   DATA REVIEW:   CBC Recent Labs  Lab 01/01/18 0737  WBC 5.4  HGB 13.2  HCT 38.3  PLT 196    Chemistries  Recent Labs  Lab 12/28/17 2028  01/01/18 0737 01/02/18 0357  NA 134*   < > 142 141  K 4.5   < > 3.2* 3.5  CL 96*   < > 105 103  CO2 21*   < > 25 25  GLUCOSE 211*   < > 85 98  BUN 46*    < > 32* 23*  CREATININE 2.05*   < > 1.04* 0.91  CALCIUM 10.1   < > 8.5* 8.6*  MG  --    < > 2.3  --   AST 37  --   --   --   ALT 24  --   --   --   ALKPHOS 87  --   --   --   BILITOT 2.0*  --   --   --    < > = values in this interval not displayed.    Cardiac Enzymes Recent Labs  Lab 12/29/17 0258 12/29/17 0831  TROPONINI 0.03* 0.03*    Microbiology Results  @MICRORSLT48 @  RADIOLOGY:  No results found.    Allergies as of 01/04/2018      Reactions   Penicillin G Hives, Itching   Has patient had a PCN reaction causing immediate rash, facial/tongue/throat swelling, SOB or lightheadedness with hypotension: Yes Has patient had a PCN reaction causing severe rash involving mucus membranes or skin necrosis: No Has patient had a PCN reaction that required hospitalization: No Has patient had a PCN reaction occurring within the last 10 years: Yes If all of the above answers are "NO", then may proceed with Cephalosporin use.   Succinylcholine    Patient's daughter had emergency surgery and was awake for the entire procedure, was told she should not have succ's and neither should her mom      Medication List    STOP taking these medications   amLODipine 5 MG tablet Commonly known as:  NORVASC   apixaban 5 MG Tabs tablet Commonly known as:  ELIQUIS   losartan 50 MG tablet Commonly known as:  COZAAR     TAKE these medications   albuterol 108 (90 Base) MCG/ACT inhaler Commonly known as:  PROVENTIL HFA;VENTOLIN HFA Inhale 2 puffs into the lungs every 6 (six) hours as needed.   aspirin EC 81 MG tablet Take 1 tablet by mouth daily.   BREO ELLIPTA 100-25 MCG/INH Aepb Generic drug:  fluticasone furoate-vilanterol Inhale 1 puff into the lungs 2 (two) times daily.   metoprolol succinate 25 MG 24 hr tablet Commonly known as:  TOPROL-XL Take 1 tablet (25 mg total) by mouth daily.   ondansetron 4 MG disintegrating tablet Commonly known as:  ZOFRAN ODT Take 1 tablet (4 mg  total) by mouth every 8 (eight) hours as needed for nausea or vomiting.   pravastatin 10 MG tablet Commonly known as:  PRAVACHOL Take 10 mg by mouth at bedtime.   warfarin 5 MG tablet Commonly known as:  COUMADIN Take 1 tablet daily        Management plans discussed with the patient and she is in agreement. Stable for discharge home  Patient should follow up with pcp  CODE STATUS:     Code Status Orders  (From admission, onward)        Start     Ordered   12/29/17 0246  Full code  Continuous     12/29/17 0245    Code Status History    Date Active Date Inactive Code Status Order ID Comments User Context   04/29/2017 22:07 05/04/2017 19:54 Full Code [id number]  Nicholes Mango, MD Inpatient    Advance Directive Documentation     Most Recent Value  Type of Advance Directive  Living will  Pre-existing out of facility DNR order (yellow form or pink MOST form)  No data  "MOST" Form in Place?  No data      TOTAL TIME TAKING CARE OF THIS PATIENT: 38 minutes.    Note: This dictation was prepared with Dragon dictation along with smaller phrase technology. Any transcriptional errors that result from this process are unintentional.  Kiriana Worthington M.D on 01/04/2018 at 7:52 AM  Between 7am to 6pm - Pager - [phone] After 6pm go to [email or web address] - password [email or web address] [facility]  Office  [phone]  CC: Primary care physician; Glendon Axe, MD

## 2018-01-04 NOTE — Discharge Instructions (Signed)
[facility] delivers medications to [facility] for $2 charge. Phone: [phone]

## 2018-01-04 NOTE — Telephone Encounter (Signed)
TCM....  Patient is being discharged   They saw Carly Khan  They are scheduled to see [name]  on 4/8  They were seen for small bowel obstruction and afib  They need to be seen within 1 to 2 weeks  Pt added to wait list   Please call

## 2018-01-04 NOTE — Progress Notes (Signed)
CC: SBO Subjective: Doing well, no fevers, taking PO. No emesis Ostomy working  Objective: Vital signs in last 24 hours: Temp:  [97.9 F (36.6 C)-98.9 F (37.2 C)] 97.9 F (36.6 C) (03/04 0357) Pulse Rate:  [60-74] 60 (03/04 0357) Resp:  [18-19] 19 (03/03 1934) BP: (116-136)/(65-83) 126/65 (03/04 0357) SpO2:  [96 %-98 %] 97 % (03/04 0357) Last BM Date: 01/03/18  Intake/Output from previous day: 03/03 0701 - 03/04 0700 In: 3301.3 [P.O.:2040; I.V.:1261.3] Out: 550 [Stool:550] Intake/Output this shift: No intake/output data recorded.  Physical exam: NAD Abd: soft, NT. ileostomy pink , patent. No peritonitis. Parastomal hernia EExt; no edema and well perfused  Lab Results: CBC  No results for input(s): WBC, HGB, HCT, PLT in the last 72 hours. BMET Recent Labs    01/02/18 0357  NA 141  K 3.5  CL 103  CO2 25  GLUCOSE 98  BUN 23*  CREATININE 0.91  CALCIUM 8.6*   PT/INR Recent Labs    01/03/18 0931 01/04/18 0429  LABPROT 13.0 13.4  INR 0.99 1.03   ABG No results for input(s): PHART, HCO3 in the last 72 hours.  Invalid input(s): PCO2, PO2  Studies/Results: No results found.  Anti-infectives: Anti-infectives (From admission, onward)   Start     Dose/Rate Route Frequency Ordered Stop   12/29/17 0500  cefTRIAXone (ROCEPHIN) 1 g in sodium chloride 0.9 % 100 mL IVPB  Status:  Discontinued     1 g 200 mL/hr over 30 Minutes Intravenous Every 24 hours 12/29/17 0449 01/03/18 0800   12/28/17 2230  levofloxacin (LEVAQUIN) IVPB 500 mg     500 mg 100 mL/hr over 60 Minutes Intravenous  Once 12/28/17 2219 12/29/17 0021      Assessment/Plan: resolved SBO from parastomal hernia PT anticoagulated for a fib atrial clot F/U outpt and consider reversal after w/u is completed  Caroleen Hamman, MD, FACS  01/04/2018

## 2018-01-04 NOTE — Plan of Care (Signed)
  Education: Knowledge of General Education information will improve 01/04/2018 1047 - Progressing by Darrelyn Hillock, RN   Education: Knowledge of General Education information will improve 01/04/2018 1047 - Progressing by Darrelyn Hillock, RN   Education: Knowledge of General Education information will improve 01/04/2018 1047 - Progressing by Darrelyn Hillock, RN   Education: Knowledge of General Education information will improve 01/04/2018 1047 - Progressing by Darrelyn Hillock, RN   Education: Knowledge of General Education information will improve 01/04/2018 1047 - Progressing by Darrelyn Hillock, RN   Education: Knowledge of General Education information will improve 01/04/2018 1047 - Progressing by Darrelyn Hillock, RN

## 2018-01-04 NOTE — Telephone Encounter (Signed)
Patient contacted regarding discharge from [facility] on 01/04/18.   Patient understands to follow up with provider ? On 02/10/18 at 1100am at [facility].  Patient understands discharge instructions? Yes  Patient understands medications and regiment? Yes  Patient understands to bring all medications to this visit? Yes   At this time patient does not have any questions. She will call us back tomorrow if any questions or concerns arise.  We did reschedule her appointment as she only has a ride on Wednesdays.

## 2018-01-04 NOTE — Progress Notes (Signed)
ANTICOAGULATION CONSULT NOTE - Initial Consult  Pharmacy Consult for Warfarin Indication: atrial fibrillation  Allergies  Allergen Reactions  . Penicillin G Hives and Itching    Has patient had a PCN reaction causing immediate rash, facial/tongue/throat swelling, SOB or lightheadedness with hypotension: Yes Has patient had a PCN reaction causing severe rash involving mucus membranes or skin necrosis: No Has patient had a PCN reaction that required hospitalization: No Has patient had a PCN reaction occurring within the last 10 years: Yes If all of the above answers are "NO", then may proceed with Cephalosporin use.  . Succinylcholine     Patient's daughter had emergency surgery and was awake for the entire procedure, was told she should not have succ's and neither should her mom     Patient Measurements: Height: 5\' 4"  (162.6 cm) Weight: 150 lb 1.6 oz (68.1 kg) IBW/kg (Calculated) : 54.7  Vital Signs: Temp: 97.9 F (36.6 C) (03/04 0357) Temp Source: Oral (03/04 0357) BP: 126/65 (03/04 0357) Pulse Rate: 60 (03/04 0357)  Labs: Recent Labs    01/02/18 0357 01/03/18 0931 01/04/18 0429  LABPROT  --  13.0 13.4  INR  --  0.99 1.03  CREATININE 0.91  --   --     Estimated Creatinine Clearance: 56.1 mL/min (by C-G formula based on SCr of 0.91 mg/dL).   Medical History: Past Medical History:  Diagnosis Date  . CAD in native artery 1991   a. s/p CABG in 1991 in [place]; b. 06/2017 MV: EF 72%, no ischemia/infarct-->Low risk.  . Cancer of left lung ([place]) 2016   a. 12/2015 LUL lung resection.  . Chronic Dyspnea on exertion   . CKD (chronic kidney disease) stage 3, GFR 30-59 ml/min ([place]) 04/09/2017   a. 04/2017 ARF & Hyperkalemia req HD x 1.  . 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 Diverticulitis   . Essential hypertension 04/09/2017  . Family history of adverse reaction to anesthesia   . History of sebaceous cyst 02/28/2017  . History of stress test   . Intermittent vertigo 04/09/2017  .  Itching 02/28/2017  . Left Atrial Appendage Thrombus    a. Incidentally noted on CT 04/2017;  b. 04/2017 Echo: EF 65-70%, Gr1 DD, no LA filling defect seen; c. 05/2017 TEE: EF 60-65%, no RA/LA thrombus; d. Later found to have PAF on Event monitor-->chronic Eliquis.  Marland Kitchen PAF (paroxysmal atrial fibrillation) ([place])    a. Discovered 06/2017 on Event monitor as part of w/u for LAA thrombus. CHA2DS2VASc = 4-->Eliquis.    Medications:  Scheduled:  . fluticasone furoate-vilanterol  1 puff Inhalation BID  . metoprolol succinate  25 mg Oral Daily  . pravastatin  10 mg Oral QHS  . warfarin  5 mg Oral ONCE-1800  . Warfarin - Pharmacist Dosing Inpatient   Does not apply q1800    Assessment: Begin Warfarin for PAF in this 67 yo female. 3/3  INR = 1.03  Goal of Therapy:  INR = 2-3   Plan:  Patient did not get warfarin dose last night. Pt to be discharged today. She has a hx of afib, w/ atrial thrombus found last June. Per pt hx of mini strokes. Was on apixaban, did not have Rx coverage at time- was paying out of pocket, but hasnt taken for last several months. Pt now has part D coverage. Spoke to pt about pros and cons with both medications. Encouraged her to call insurance company to figure out cost- explained deductible and coverage gap. Encouraged pt to start  warfarin for now. Should get her INR checked- ideally on Friday but at least by next Wed at the very latest. Pt has a friend who comes and takes her places on Wednesdays. Pt does not drive and does not have a car. Told patient I would put a consult in to care management to see if she qualifies for home health agency to come out and draw INR. Pt provided warfarin handout and reviewed information with her. Start warfarin 5mg  daily. Also provided # to [facility] -a pharmacy that delivers for $2 and informed her of how to get her medications transferred from [facility] to them.  Ramond Dial, Pharm.D, BCPS Clinical Pharmacist  01/04/2018,8:45 AM

## 2018-01-04 NOTE — Telephone Encounter (Signed)
Patient currently admitted at this time. 

## 2018-01-04 NOTE — Care Management Note (Signed)
Case Management Note  Patient Details  Name: Carly Khan MRN: [id number] Date of Birth: Sep 12, 1951  Subjective/Objective:  Met with patient at bedside. Discussed home health for PT/INR, SW for community resources. She is agreeable. Offered choice of home health agencies. Referral to [facility] for RN and CSW.   Patient normally lives alone. Her daughter is here from [place] staying with her temporarily. Patient lives on social security [phone]) and alimony [phone]). She has no income to get home and no car. At this time. Requesting a cab voucher. Voucher given to primary RN.                 Action/Plan: [facility] for RN and SW  Expected Discharge Date:  01/04/18               Expected Discharge Plan:  [place]  In-House Referral:     Discharge planning Services  CM Consult  Post Acute Care Choice:  Home Health Choice offered to:  Patient  DME Arranged:    DME Agency:     HH Arranged:  RN, Social Work [employer] Agency:  [place]  Status of Service:  Completed, signed off  If discussed at [employer], dates discussed:    Additional Comments:  Jolly Mango, RN 01/04/2018, 9:41 AM

## 2018-01-08 ENCOUNTER — Telehealth: Payer: Self-pay

## 2018-01-08 NOTE — Telephone Encounter (Signed)
EMMI Follow-up: Transportation follow-up.  Talked with Ms. Stringfellow and she got her appointments rescheduled to Wednesday's as that is the only day she has transportation. She states she is in a lot of pain and unable to eat but drinking some ginger ale and resting in bed. She talked about her "go to" food (broccoli cheddar soup) didn't even appeal to her and I asked if the liked beef/chicken broth, ensure, etc. but states the worst part was throwing up.  She is hoping to get to the store when her check comes in on 3/13 and has transportation.  Should follow-up with MD if continues to have pain. I explained she would receive another automated call and to respond to prompts if she had any concerns.

## 2018-01-20 ENCOUNTER — Telehealth: Payer: Self-pay | Admitting: Internal Medicine

## 2018-01-20 ENCOUNTER — Encounter: Payer: Self-pay | Admitting: Surgery

## 2018-01-20 ENCOUNTER — Ambulatory Visit (INDEPENDENT_AMBULATORY_CARE_PROVIDER_SITE_OTHER): Payer: Medicare Other | Admitting: Surgery

## 2018-01-20 DIAGNOSIS — K435 Parastomal hernia without obstruction or  gangrene: Secondary | ICD-10-CM

## 2018-01-20 NOTE — Patient Instructions (Signed)
Please see your follow up appointment listed below.  We will send Cardiac Clearances to Dr.End.  We will also send referral to  [facility]. Someone from their office will call to schedule an appointment. If you have not heard from them by 01/27/18 then please call our office and let us know.   We will also send referral to Oncologist Dr.Roa and they will call to make an appointment as well.

## 2018-01-20 NOTE — Progress Notes (Signed)
01/20/2018  History of Present Illness: Carly Khan is a 67 y.o. female who presents for follow up of small bowel obstruction.  She has a history of diverticulitis, s/p sigmoidectomy with loop ileostomy in [place].  She was seen initially by Dr. Adonis Huguenin in 2018 but was lost to follow up until her SBO episode last month.  She reports that she's doing well without any obstructive symptoms.  She is eating and though initially was very hesistant about her diet, she has been able to tolerate diet well.  Her ostomy is working with only some soreness in the right lower quadrant from her parastomal hernia.    She is taking Coumadin for her anticoagulation and is due to see her cardiologist on 4/10.  Past Medical History: Past Medical History:  Diagnosis Date  . CAD in native artery 1991   a. s/p CABG in 1991 in [place]; b. 06/2017 MV: EF 72%, no ischemia/infarct-->Low risk.  . Cancer of left lung ([place]) 2016   a. 12/2015 LUL lung resection.  . Chronic Dyspnea on exertion   . CKD (chronic kidney disease) stage 3, GFR 30-59 ml/min ([place]) 04/09/2017   a. 04/2017 ARF & Hyperkalemia req HD x 1.  . 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 Diverticulitis   . Essential hypertension 04/09/2017  . Family history of adverse reaction to anesthesia   . History of sebaceous cyst 02/28/2017  . History of stress test   . Intermittent vertigo 04/09/2017  . Itching 02/28/2017  . Left Atrial Appendage Thrombus    a. Incidentally noted on CT 04/2017;  b. 04/2017 Echo: EF 65-70%, Gr1 DD, no LA filling defect seen; c. 05/2017 TEE: EF 60-65%, no RA/LA thrombus; d. Later found to have PAF on Event monitor-->chronic Eliquis.  Marland Kitchen PAF (paroxysmal atrial fibrillation) ([place])    a. Discovered 06/2017 on Event monitor as part of w/u for LAA thrombus. CHA2DS2VASc = 4-->Eliquis.     Past Surgical History: Past Surgical History:  Procedure Laterality Date  . ABDOMINAL HYSTERECTOMY    . cardiac bypass  1990  . carpel tunnel    . CHOLECYSTECTOMY     . COLON SURGERY    . COLONOSCOPY    . CT guided needle placement    . EXPLORATORY LAPAROTOMY    . GALLBLADDER SURGERY    . loop ileostomy    . SIGMOID RESECTION / RECTOPEXY    . TEE WITHOUT CARDIOVERSION N/A 05/04/2017   Procedure: TRANSESOPHAGEAL ECHOCARDIOGRAM (TEE);  Surgeon: Wellington [place], MD;  Location: [facility] ORS;  Service: Cardiovascular;  Laterality: N/A;  . THORACOSCOPY    . vaginectomy      Home Medications: Prior to Admission medications   Medication Sig Start Date End Date Taking? Authorizing Provider  albuterol (PROVENTIL HFA;VENTOLIN HFA) 108 (90 Base) MCG/ACT inhaler Inhale 2 puffs into the lungs every 6 (six) hours as needed. 04/13/17 04/13/18  [provider]  aspirin EC 81 MG tablet Take 1 tablet by mouth daily. 04/09/17 04/09/18  [provider]  BREO ELLIPTA 100-25 MCG/INH AEPB Inhale 1 puff into the lungs 2 (two) times daily.    [provider]  metoprolol succinate (TOPROL-XL) 25 MG 24 hr tablet Take 1 tablet (25 mg total) by mouth daily. 01/04/18 01/04/19  Bettey Costa, MD  ondansetron (ZOFRAN ODT) 4 MG disintegrating tablet Take 1 tablet (4 mg total) by mouth every 8 (eight) hours as needed for nausea or vomiting. 12/26/17   Earleen [place], MD  pravastatin (PRAVACHOL) 10 MG tablet Take  10 mg by mouth at bedtime.    [provider]  warfarin (COUMADIN) 5 MG tablet Take 1 tablet daily 01/04/18   Bettey Costa, MD    Allergies: Allergies  Allergen Reactions  . Penicillin G Hives and Itching    Has patient had a PCN reaction causing immediate rash, facial/tongue/throat swelling, SOB or lightheadedness with hypotension: Yes Has patient had a PCN reaction causing severe rash involving mucus membranes or skin necrosis: No Has patient had a PCN reaction that required hospitalization: No Has patient had a PCN reaction occurring within the last 10 years: Yes If all of the above answers are "NO", then may proceed with Cephalosporin use.  .  Succinylcholine     Patient's daughter had emergency surgery and was awake for the entire procedure, was told she should not have succ's and neither should her mom     Social History:  reports that she has been smoking.  She has a 20.00 pack-year smoking history. she has never used smokeless tobacco. She reports that she does not drink alcohol or use drugs.   Family History: Family History  Problem Relation Age of Onset  . Cancer Mother   . Stomach cancer Mother   . Heart disease Father   . Hypertension Father   . Parkinson's disease Sister   . Cervical cancer Sister     Review of Systems: Review of Systems  Constitutional: Negative for chills and fever.  Respiratory: Negative for shortness of breath.   Cardiovascular: Negative for chest pain.  Gastrointestinal: Negative for abdominal pain, constipation, diarrhea, nausea and vomiting.    Physical Exam There were no vitals taken for this visit. CONSTITUTIONAL: No acute distress RESPIRATORY:  Lungs are clear, and breath sounds are equal bilaterally. Normal respiratory effort without pathologic use of accessory muscles. CARDIOVASCULAR: Heart is regular without murmurs, gallops, or rubs. GI: The abdomen is soft, non-distended, non-tender to palpation.  Patient has a right lower quadrant loop ileostomy which is healthy and viable with stool in bag.  There is mild bulging around her ostomy consistent with her parastomal hernia, which is easily reducible and without tenderness.  MUSCULOSKELETAL:  Normal muscle strength and tone in all four extremities.  No peripheral edema or cyanosis. NEUROLOGIC:  Motor and sensation is grossly normal.  Cranial nerves are grossly intact. PSYCH:  Alert and oriented to person, place and time. Affect is normal.  Labs/Imaging: None recently  Assessment and Plan: This is a 67 y.o. female who presents for follow up for a small bowel obstruction due to parastomal hernia.  --Discussed with the patient  that currently she needs to be seen by different teams to help clear her for surgery to reverse her ileostomy. 1.  She needs to be seen by her cardiologist.  In the past, there was concern for an atrial thrombus and with her history of paroxysmal atrial fibrillation, she could not come off anticoagulation and then has been off/on anticoagulation due to inability to afford Eliquis.  She's on coumadin now.  Main question for cardiology would be is it safe to be off anticoagulation for colonoscopy and eventual surgery. 2.  She needs to be seen by ENT.  Back in 2018, there was a referral sent but I cannot find any notes.  She has a right parotid mass noted on PET from 06/24/16 and CT scan from 09/19/16.  There was concern this could be a lymph node vs small pleomorphic adenoma. 3.  She needs to be seen by Dr.  Janese Banks with oncology.  She had initially seen her on 05/14/17 for her history of left lung cancer, s/p left upper lobectomy.  A CT chest was ordered and done in 11/2017, but there was no further follow up.  Previous note from Dr. Janese Banks said she would be cleared for ileostomy takedown, but would be reasonable to follow up again. 4.  When able to be off her anticoagulation, she would also need a colonoscopy prior to ileostomy takedown to help assess her anastomosis (which looks patent on prior barium enema), and to make sure there is no other polyps or masses that would require resection.  She will follow up with [place] after her appointment with cardiology to make sure the rest of her follow up is progressing well and she does not get lost to follow up.  Face-to-face time spent with the patient and care providers was 40 minutes, with more than 50% of the time spent counseling, educating, and coordinating care of the patient.     Melvyn Neth, [place]

## 2018-01-20 NOTE — Progress Notes (Signed)
Cardiac and anticoagulant clearances faxed to Dr.End at this time.

## 2018-01-20 NOTE — Telephone Encounter (Signed)
° °  [facility] Pre-operative Risk Assessment    Request for surgical clearance:  1. What type of surgery is being performed?Ileostomy take down   2. When is this surgery scheduled? tbd   3. What type of clearance is required (medical clearance vs. Pharmacy clearance to hold med vs. Both)? noth  4. Are there any medications that need to be held prior to surgery and how long? See form   5. Practice name and name of physician performing surgery? [facility]  6. What is your office phone and fax number? [phone]   Fax [phone]   7. Anesthesia type (None, local, MAC, general) ? unkown   Carly Khan 01/20/2018, 11:59 AM  _________________________________________________________________   (provider comments below)

## 2018-01-21 NOTE — Telephone Encounter (Signed)
Pt scheduled for 02/10/18 with Ignacia Bayley  They are on wait list for sooner appointment

## 2018-01-21 NOTE — Telephone Encounter (Signed)
No answer. Left detailed message, ok per DPR, to keep upcoming appointment in order to receive clearance and to call back if any further questions.

## 2018-01-21 NOTE — Telephone Encounter (Signed)
Patient missed 3 month follow-up visit in February, as she was hospitalized. I recommend that she be seen in our office in order to better assess surgical risk and to discuss the need for anticoagulation bridging. She has an appointment scheduled with Ignacia Bayley in early April. It is okay to keep this or move it up if ostomy take-down needs to be done more urgently.  [name], MD [facility] Pager: [phone]

## 2018-01-29 ENCOUNTER — Telehealth: Payer: Self-pay | Admitting: Surgery

## 2018-01-29 NOTE — Telephone Encounter (Signed)
I sent a message to Angie asking if she had sent the referral to [facility]. Awaiting on her response.  Then I will call the patient.

## 2018-01-29 NOTE — Telephone Encounter (Signed)
Patients is calling said she was told to call back if she still hadn't heard from ENT, patient still hasn't heard anything please call patient and advise.

## 2018-01-29 NOTE — Telephone Encounter (Signed)
Called patient to let her know that she had an appointment scheduled with ENT. I gave her their number just in case she was not able to make it or needed to rescheduled. Patient had no further questions.

## 2018-02-03 ENCOUNTER — Other Ambulatory Visit: Payer: Self-pay

## 2018-02-03 ENCOUNTER — Inpatient Hospital Stay: Payer: Medicare Other | Attending: Oncology | Admitting: Oncology

## 2018-02-03 ENCOUNTER — Encounter: Payer: Self-pay | Admitting: Oncology

## 2018-02-03 VITALS — BP 159/85 | HR 96 | Wt 149.4 lb

## 2018-02-03 DIAGNOSIS — Z85118 Personal history of other malignant neoplasm of bronchus and lung: Secondary | ICD-10-CM | POA: Diagnosis not present

## 2018-02-03 DIAGNOSIS — C3492 Malignant neoplasm of unspecified part of left bronchus or lung: Secondary | ICD-10-CM

## 2018-02-03 NOTE — Progress Notes (Signed)
Pt in for follow up, was recently in hospital has multiple hernias and a stoma.  Pt to have surgery but surgeon wanted pt seen here for clearance to have surgeries.

## 2018-02-04 NOTE — Progress Notes (Signed)
Hematology/Oncology Consult note [facility] Telephone:([phone] Fax:([phone]  Patient Care Team: Glendon Axe, MD as PCP - General (Internal Medicine) End, Harrell Gave, MD as PCP - Cardiology (Cardiology)   Name of the patient: Carly Khan  [id number]  [birth year]   REASON FOR VISIT Follow up for lung cancer.   Date of visit: 02/03/2018    History of presenting illness- 1. Patient is a 67 year old Caucasian female who had diverticulosis, by perforation/colovaginal fistula required electively and this sample repair in January 2017. She has an ileostomy in place which was supposed to be reversed while she was in [place]. She has now moved to [facility] and Dr. Adonis Huguenin plans to do the reversal procedure  2 patient also has a history of Stage I lung cancer which was diagnosed in August 2017 chest x-ray that was done as a part of the workup for colovaginal fistula showed a 4.4 x 3.0 with masslike consolidation in the left upper lobe. This was initially seen on the CT chest in November 2016 and the mass grew to 4.5 x 3.5 x 3.8 cm in June 2017. Patient had a CT-guided biopsy of the mass in August 2017 and was found to be TTF-1 positive adenocarcinoma. Predominant histology was papillary adenocarcinoma with additional micropapillary and lipid pattern bronchial and vascular margins were involved and there was no pleural invasion  3. Patient was seen by Dr Jasmine Pang who discussed questionable role for adjuvant chemotherapy given stage IA disease [id number]. She opted for watchful surveillance   4. She did have a recent CT chest on 11/18/2016     ECOG PS- 1  Pain scale- 0   Review of systems- Review of Systems  Constitutional: Negative for chills, fever, malaise/fatigue and weight loss.  HENT: Negative for nosebleeds.   Eyes: Negative for double vision, photophobia and pain.  Respiratory: Negative for cough, hemoptysis and sputum production.   Cardiovascular:  Negative for chest pain, palpitations and orthopnea.  Gastrointestinal: Negative for abdominal pain, nausea and vomiting.  Genitourinary: Negative for dysuria and urgency.  Musculoskeletal: Negative for myalgias and neck pain.  Skin: Negative for rash.  Neurological: Negative for dizziness and headaches.  Endo/Heme/Allergies: Does not bruise/bleed easily.  Psychiatric/Behavioral: Negative for hallucinations.    Allergies  Allergen Reactions  . Penicillin G Hives and Itching    Has patient had a PCN reaction causing immediate rash, facial/tongue/throat swelling, SOB or lightheadedness with hypotension: Yes Has patient had a PCN reaction causing severe rash involving mucus membranes or skin necrosis: No Has patient had a PCN reaction that required hospitalization: No Has patient had a PCN reaction occurring within the last 10 years: Yes If all of the above answers are "NO", then may proceed with Cephalosporin use.  . Succinylcholine     Patient's daughter had emergency surgery and was awake for the entire procedure, was told she should not have succ's and neither should her mom     Patient Active Problem List   Diagnosis Date Noted  . SBO (small bowel obstruction) ([place]) 12/29/2017  . Intractable nausea and vomiting 12/28/2017  . UTI (urinary tract infection) 12/28/2017  . Chronic obstructive pulmonary disease ([place]) 11/25/2017  . Current smoker 11/25/2017  . Personal history of lung cancer 11/25/2017  . Pre-diabetes 08/30/2017  . Pure hypercholesterolemia 08/30/2017  . Hepatic cirrhosis ([place]) 07/01/2017  . Shortness of breath 06/17/2017  . Paroxysmal atrial fibrillation ([place]) 06/17/2017  . Thrombus of left atrial appendage without antecedent myocardial infarction 06/17/2017  . Incisional  hernia, without obstruction or gangrene 05/27/2017  . 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 Thrombus in heart chamber 05/17/2017  . Hyperkalemia   . Acute renal failure  ([place])   . CKD (chronic kidney disease) stage 3, GFR 30-59 ml/min ([facility]) 04/09/2017  . Essential hypertension 04/09/2017  . Intermittent vertigo 04/09/2017  . CAD in native artery 02/28/2017  . 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 History of sebaceous cyst 02/28/2017  . Itching 02/28/2017  . 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 Lung nodule 06/25/2016  . Adenocarcinoma of left lung ([place]) 06/19/2016  . Primary cancer of left upper lobe of lung ([place]) 06/19/2016  . AKI (acute kidney injury) ([place]) 12/29/2015  . 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 CAD in native artery 1991   a. s/p CABG in 1991 in [place]; b. 06/2017 MV: EF 72%, no ischemia/infarct-->Low risk.  . Cancer of left lung ([facility]) 2016   a. 12/2015 LUL lung resection.  . Chronic Dyspnea on exertion   . CKD (chronic kidney disease) stage 3, GFR 30-59 ml/min ([place]) 04/09/2017   a. 04/2017 ARF & Hyperkalemia req HD x 1.  . 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 Diverticulitis   . Essential hypertension 04/09/2017  . Family history of adverse reaction to anesthesia   . History of sebaceous cyst 02/28/2017  . History of stress test   . Intermittent vertigo 04/09/2017  . Itching 02/28/2017  . Left Atrial Appendage Thrombus    a. Incidentally noted on CT 04/2017;  b. 04/2017 Echo: EF 65-70%, Gr1 DD, no LA filling defect seen; c. 05/2017 TEE: EF 60-65%, no RA/LA thrombus; d. Later found to have PAF on Event monitor-->chronic Eliquis.  Marland Kitchen PAF (paroxysmal atrial fibrillation) ([place])    a. Discovered 06/2017 on Event monitor as part of w/u for LAA thrombus. CHA2DS2VASc = 4-->Eliquis.     Past Surgical History:  Procedure Laterality Date  . ABDOMINAL HYSTERECTOMY    . cardiac bypass  1990  . carpel tunnel    . CHOLECYSTECTOMY    . COLON SURGERY    . COLONOSCOPY    . CT guided needle placement    . EXPLORATORY LAPAROTOMY    .  GALLBLADDER SURGERY    . loop ileostomy    . SIGMOID RESECTION / RECTOPEXY    . TEE WITHOUT CARDIOVERSION N/A 05/04/2017   Procedure: TRANSESOPHAGEAL ECHOCARDIOGRAM (TEE);  Surgeon: Wellington [place], MD;  Location: [facility] ORS;  Service: Cardiovascular;  Laterality: N/A;  . THORACOSCOPY    . vaginectomy      Social History   Socioeconomic History  . Marital status: Divorced    Spouse name: Not on file  . Number of children: Not on file  . Years of education: Not on file  . Highest education level: Not on file  Occupational History  . Not on file  Social Needs  . Financial resource strain: Not on file  . Food insecurity:    Worry: Not on file    Inability: Not on file  . Transportation needs:    Medical: Not on file    Non-medical: Not on file  Tobacco Use  . Smoking status: Current Every Day Smoker    Packs/day: 0.50    Years: 40.00    Pack years: 20.00  . Smokeless tobacco: Never Used  . Tobacco comment: denied smoking info  Substance  and Sexual Activity  . Alcohol use: No  . Drug use: No  . Sexual activity: Never  Lifestyle  . Physical activity:    Days per week: Not on file    Minutes per session: Not on file  . Stress: Not on file  Relationships  . Social connections:    Talks on phone: Not on file    Gets together: Not on file    Attends religious service: Not on file    Active member of club or organization: Not on file    Attends meetings of clubs or organizations: Not on file    Relationship status: Not on file  . Intimate partner violence:    Fear of current or ex partner: Not on file    Emotionally abused: Not on file    Physically abused: Not on file    Forced sexual activity: Not on file  Other Topics Concern  . Not on file  Social History Narrative  . Not on file     Family History  Problem Relation Age of Onset  . Cancer Mother   . Stomach cancer Mother   . Heart disease Father   . Hypertension Father   . Parkinson's disease Sister   .  Cervical cancer Sister      Current Outpatient Medications:  .  albuterol (PROVENTIL HFA;VENTOLIN HFA) 108 (90 Base) MCG/ACT inhaler, Inhale 2 puffs into the lungs every 6 (six) hours as needed., Disp: , Rfl:  .  aspirin EC 81 MG tablet, Take 1 tablet by mouth daily., Disp: , Rfl:  .  BREO ELLIPTA 100-25 MCG/INH AEPB, Inhale 1 puff into the lungs 2 (two) times daily., Disp: , Rfl:  .  metoprolol succinate (TOPROL-XL) 25 MG 24 hr tablet, Take 1 tablet (25 mg total) by mouth daily., Disp: , Rfl:  .  pravastatin (PRAVACHOL) 10 MG tablet, Take 10 mg by mouth at bedtime., Disp: , Rfl:  .  ondansetron (ZOFRAN ODT) 4 MG disintegrating tablet, Take 1 tablet (4 mg total) by mouth every 8 (eight) hours as needed for nausea or vomiting. (Patient not taking: Reported on 02/03/2018), Disp: 20 tablet, Rfl: 0 .  warfarin (COUMADIN) 5 MG tablet, Take 1 tablet daily (Patient not taking: Reported on 02/03/2018), Disp: 30 tablet, Rfl: 0   Physical exam:  Vitals:   02/03/18 1121 02/03/18 1131  BP: (!) 159/85   Pulse: 96   TempSrc: Tympanic   SpO2:  99%  Weight: 149 lb 6 oz (67.8 kg)    Physical Exam  Constitutional: She is well-developed, well-nourished, and in no distress. No distress.  HENT:  Head: Normocephalic and atraumatic.  Mouth/Throat: No oropharyngeal exudate.  Eyes: Pupils are equal, round, and reactive to light. EOM are normal. No scleral icterus.  Neck: Normal range of motion. Neck supple.  Cardiovascular: Normal rate and regular rhythm.  Pulmonary/Chest: Effort normal and breath sounds normal. She exhibits no tenderness.  Decreased breath sounds bilaterally.  Abdominal: Soft. Bowel sounds are normal.   Patient has a right lower quadrant loop ileostomy with stool in bag.   + parastomal hernia,        CMP Latest Ref Rng & Units 01/02/2018  Glucose 65 - 99 mg/dL 98  BUN 6 - 20 mg/dL 23(H)  Creatinine 0.44 - 1.00 mg/dL 0.91  Sodium 135 - 145 mmol/L 141  Potassium 3.5 - 5.1 mmol/L 3.5    Chloride 101 - 111 mmol/L 103  CO2 22 - 32 mmol/L 25  Calcium  8.9 - 10.3 mg/dL 8.6(L)  Total Protein 6.5 - 8.1 g/dL -  Total Bilirubin 0.3 - 1.2 mg/dL -  Alkaline Phos 38 - 126 U/L -  AST 15 - 41 U/L -  ALT 14 - 54 U/L -   CBC Latest Ref Rng & Units 01/01/2018  WBC 3.6 - 11.0 K/uL 5.4  Hemoglobin 12.0 - 16.0 g/dL 13.2  Hematocrit 35.0 - 47.0 % 38.3  Platelets 150 - 440 K/uL 196    Assessment and plan- Patient is a 68 y.o. female with prior h/o Lung cancer in August 2017 Stage IA s/p resection 1. Adenocarcinoma of left lung [facility])    Discussed results of CT chest with patient which does not show any evidence of lung cancer recurrence.  I repeat CT chest every 6 months.  He should have repeat of BMP prior to CT.  Patient is advised to continue follow-up with gastroenterology for liver cirrhosis evaluation and management. From oncology aspect, patient is okay to proceed with ileostomy reversal. Recent CBC showed normal counts.  Smoking cessation discussed.    Follow up in July after next 6 months CT.   Earlie Server, MD, PhD Hematology Oncology [facility] at [facility] Pager- [phone]

## 2018-02-08 ENCOUNTER — Ambulatory Visit: Payer: Medicare Other | Admitting: Nurse Practitioner

## 2018-02-10 ENCOUNTER — Other Ambulatory Visit: Payer: Self-pay

## 2018-02-10 ENCOUNTER — Encounter: Payer: Self-pay | Admitting: Nurse Practitioner

## 2018-02-10 ENCOUNTER — Encounter: Payer: Self-pay | Admitting: Surgery

## 2018-02-10 ENCOUNTER — Ambulatory Visit (INDEPENDENT_AMBULATORY_CARE_PROVIDER_SITE_OTHER): Payer: Medicare Other | Admitting: Surgery

## 2018-02-10 ENCOUNTER — Ambulatory Visit (INDEPENDENT_AMBULATORY_CARE_PROVIDER_SITE_OTHER): Payer: Medicare Other | Admitting: Nurse Practitioner

## 2018-02-10 VITALS — BP 157/103 | HR 96 | Ht 64.0 in | Wt 142.5 lb

## 2018-02-10 VITALS — BP 154/111 | HR 103 | Temp 97.6°F | Ht 62.0 in | Wt 142.0 lb

## 2018-02-10 DIAGNOSIS — K435 Parastomal hernia without obstruction or  gangrene: Secondary | ICD-10-CM | POA: Diagnosis not present

## 2018-02-10 DIAGNOSIS — I10 Essential (primary) hypertension: Secondary | ICD-10-CM

## 2018-02-10 DIAGNOSIS — K432 Incisional hernia without obstruction or gangrene: Secondary | ICD-10-CM | POA: Diagnosis not present

## 2018-02-10 DIAGNOSIS — E782 Mixed hyperlipidemia: Secondary | ICD-10-CM | POA: Diagnosis not present

## 2018-02-10 DIAGNOSIS — I480 Paroxysmal atrial fibrillation: Secondary | ICD-10-CM

## 2018-02-10 DIAGNOSIS — Z433 Encounter for attention to colostomy: Secondary | ICD-10-CM

## 2018-02-10 DIAGNOSIS — Z932 Ileostomy status: Secondary | ICD-10-CM

## 2018-02-10 DIAGNOSIS — I513 Intracardiac thrombosis, not elsewhere classified: Secondary | ICD-10-CM | POA: Diagnosis not present

## 2018-02-10 DIAGNOSIS — Z01810 Encounter for preprocedural cardiovascular examination: Secondary | ICD-10-CM

## 2018-02-10 DIAGNOSIS — I2510 Atherosclerotic heart disease of native coronary artery without angina pectoris: Secondary | ICD-10-CM | POA: Diagnosis not present

## 2018-02-10 MED ORDER — PEG 3350-KCL-NABCB-NACL-NASULF 236 G PO SOLR: ORAL | 0 refills | Status: DC

## 2018-02-10 NOTE — Patient Instructions (Signed)
Please see your blue-pre care sheet for surgery information.  Please call Dr.Vanga's office [phone] to schedule an appointment with Dr.Vanga.

## 2018-02-10 NOTE — Patient Instructions (Signed)
Medication Instructions: - Please pick up your prescriptions at the pharmacy & start taking these for:  1) coumadin (warfarin) 5 mg - take 1 tablet by mouth once daily in the evening (this will need to be dosed by the coumadin clinic nurse going forward)  2) toprol (metoprolol succinate) 25 mg- take 1 tablet by mouth once daily   Labwork: - none ordered  Procedures/Testing: - none ordered  Follow-Up: - Your physician recommends that you schedule a follow-up appointment in: 1 week (on Wednesday) with the Coumadin Clinic nurse  - Your physician recommends that you schedule a follow-up appointment in: 2-3 months with Dr. Saunders Revel.    Any Additional Special Instructions Will Be Listed Below (If Applicable).     If you need a refill on your cardiac medications before your next appointment, please call your pharmacy.

## 2018-02-10 NOTE — Progress Notes (Signed)
02/10/2018  History of Present Illness: Carly Khan is a 67 y.o. female who presents for follow up from a parastomal hernia causing a bowel obstruction on 2/25. She was seen in office on 3/20 for follow up.  At that point, we had made a plan for her to see cardiology, oncology, ENT, and GI prior to surgery to reverse her ostomy.  Oncology has cleared her for surgery.  She just saw cardiology today.  She had never started her Coumadin prescription due to financial issues but is now able to get the prescription filled.  From cardiology standpoint, she needs anticoagulation but can be bridged for procedures such as colonoscopy and surgery.  No other workup is needed from their standpoint.  She is due to see ENT on 4/24.  She denies any abdominal pain or issues with bowel obstruction.  Her ostomy is functioning well.  Past Medical History: Past Medical History:  Diagnosis Date  . CAD in native artery 1991   a. s/p CABG in 1991 in [place]; b. 06/2017 MV: EF 72%, no ischemia/infarct-->Low risk.  . Cancer of left lung ([place]) 2016   a. 12/2015 LUL lung resection.  . Chronic Dyspnea on exertion   . CKD (chronic kidney disease) stage 3, GFR 30-59 ml/min ([place]) 04/09/2017   a. 04/2017 ARF & Hyperkalemia req HD x 1.  . 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 Diverticulitis   . Essential hypertension 04/09/2017  . Family history of adverse reaction to anesthesia   . History of sebaceous cyst 02/28/2017  . History of stress test   . Intermittent vertigo 04/09/2017  . Itching 02/28/2017  . Left Atrial Appendage Thrombus    a. Incidentally noted on CT 04/2017;  b. 04/2017 Echo: EF 65-70%, Gr1 DD, no LA filling defect seen; c. 05/2017 TEE: EF 60-65%, no RA/LA thrombus; d. Later found to have PAF on Event monitor 06/2017-->initially placed on eliquis; changed to coumadin 2/2 $.  . PAF (paroxysmal atrial fibrillation) ([place])    a. Discovered 06/2017 on Event monitor as part of w/u for LAA thrombus. CHA2DS2VASc = 4-->Eliquis.  . Stroke  [facility])    a. Pt reports prior h/o CVA; b. 07/2017 Head CT: Chronic atrophy and small vessel ischemic changes. No acute intracranial abnormalities.     Past Surgical History: Past Surgical History:  Procedure Laterality Date  . ABDOMINAL HYSTERECTOMY    . cardiac bypass  1990  . carpel tunnel    . CHOLECYSTECTOMY    . COLON SURGERY    . COLONOSCOPY    . CT guided needle placement    . EXPLORATORY LAPAROTOMY    . GALLBLADDER SURGERY    . loop ileostomy    . SIGMOID RESECTION / RECTOPEXY    . TEE WITHOUT CARDIOVERSION N/A 05/04/2017   Procedure: TRANSESOPHAGEAL ECHOCARDIOGRAM (TEE);  Surgeon: Wellington Hampshire, MD;  Location: [facility] ORS;  Service: Cardiovascular;  Laterality: N/A;  . THORACOSCOPY    . vaginectomy      Home Medications: Prior to Admission medications   Medication Sig Start Date End Date Taking? Authorizing Provider  acetaminophen (TYLENOL) 500 MG tablet Take 500 mg by mouth every 6 (six) hours as needed.    [provider]  albuterol (PROVENTIL HFA;VENTOLIN HFA) 108 (90 Base) MCG/ACT inhaler Inhale 2 puffs into the lungs every 6 (six) hours as needed. 04/13/17 04/13/18  [provider]  aspirin EC 81 MG tablet Take 1 tablet by mouth daily. 04/09/17 04/09/18  [provider]  BREO ELLIPTA 100-25  MCG/INH AEPB Inhale 1 puff into the lungs 2 (two) times daily.    [provider]  metoprolol succinate (TOPROL-XL) 25 MG 24 hr tablet Take 1 tablet (25 mg total) by mouth daily. 01/04/18 01/04/19  Bettey Costa, MD  ondansetron (ZOFRAN ODT) 4 MG disintegrating tablet Take 1 tablet (4 mg total) by mouth every 8 (eight) hours as needed for nausea or vomiting. 12/26/17   Earleen Newport, MD  pravastatin (PRAVACHOL) 10 MG tablet Take 10 mg by mouth at bedtime.    [provider]  warfarin (COUMADIN) 5 MG tablet Take 1 tablet daily Patient not taking: Reported on 02/10/2018 01/04/18   Bettey Costa, MD    Allergies: Allergies  Allergen Reactions  .  Penicillin G Hives and Itching    Has patient had a PCN reaction causing immediate rash, facial/tongue/throat swelling, SOB or lightheadedness with hypotension: Yes Has patient had a PCN reaction causing severe rash involving mucus membranes or skin necrosis: No Has patient had a PCN reaction that required hospitalization: No Has patient had a PCN reaction occurring within the last 10 years: Yes If all of the above answers are "NO", then may proceed with Cephalosporin use.  . Succinylcholine     Patient's daughter had emergency surgery and was awake for the entire procedure, was told she should not have succ's and neither should her mom     Review of Systems: Review of Systems  Constitutional: Negative for chills and fever.  Respiratory: Negative for shortness of breath.   Cardiovascular: Negative for chest pain.  Gastrointestinal: Negative for abdominal pain, constipation, diarrhea, nausea and vomiting.    Physical Exam BP (!) 154/111   Pulse (!) 103   Temp 97.6 F (36.4 C) (Oral)   Ht 5\' 2"  (1.575 m)   Wt 142 lb (64.4 kg)   BMI 25.97 kg/m  CONSTITUTIONAL: No acute distress HEENT:  Normocephalic, atraumatic, extraocular motion intact.  Poor dentition. NECK: Trachea is midline, and there is no jugular venous distension.  RESPIRATORY:  Lungs are clear, and breath sounds are equal bilaterally. Normal respiratory effort without pathologic use of accessory muscles. CARDIOVASCULAR: Heart is regular without murmurs, gallops, or rubs. GI: The abdomen is soft, non-distended, non-tender to palpation.  Patient has a right lower quadrant loop ileostomy which is viable and functional, with reducible and nontender parastomal hernia.  Patient also has reducible ventral incisional hernia, measuring about 8 cm diameter.  NEUROLOGIC:  Motor and sensation is grossly normal.  Cranial nerves are grossly intact. PSYCH:  Alert and oriented to person, place and time. Affect is normal.   Assessment and  Plan: This is a 67 y.o. female who presents for follow up for a parastomal hernia causing small bowel obstruction.  Discussed with patient that oncology has cleared her for surgery.  Cardiology has given her a new prescription for coumadin so she can start it today.  After discussing with Mr. Sharolyn Douglas, she should start anticoagulation and would be bridged with Lovenox for procedures or surgery.  She is due to see ENT on 4/24 and I think it's reasonable now to send the referral again for Dr. Marius Ditch with GI to see her for her history of cirrhosis as well as colonoscopy to prepare for surgery.  Tentatively, we can schedule her ileostomy reversal for 03/10/18, pending ENT and GI evaluations.  With regards to her ventral hernia, currently it's not causing troubles and given that we have to deal with small bowel for her ostomy reversal, would not  want to fix the ventral hernia in the same setting to avoid any contamination, particularly because I think mesh would be needed.  We can reverse her ostomy first, and then based on any symptoms, discuss ventral hernia repair in the future.  Discussed with patient plans for surgery on 5/8 and she is in agreement.  Risks of bleeding, infection, and injury to surrounding structures were discussed and she's willing to proceed.  Patient should start her Coumadin today and follow with coumadin RN next week for INR check.  Face-to-face time spent with the patient and care providers was 40 minutes, with more than 50% of the time spent counseling, educating, and coordinating care of the patient.     Melvyn Neth, [place]

## 2018-02-10 NOTE — Progress Notes (Signed)
Office Visit    Patient Name: Carly Khan Date of Encounter: 02/10/2018  Primary Care Provider:  Glendon Axe, MD Primary Cardiologist:  [name], MD  Chief Complaint    67 year old female with a history of CAD status post remote CABG, hypertension, hyperlipidemia, paroxysmal atrial fibrillation with left atrial appendage thrombus, noncompliance with oral anticoagulation, lung cancer status post left upper lobe resection, chronic dyspnea, colovaginal fistula, and stage III chronic kidney disease who presents for follow-up related to preoperative evaluation with pending ileostomy takedown.  Past Medical History    Past Medical History:  Diagnosis Date  . CAD in native artery 1991   a. s/p CABG in 1991 in [place]; b. 06/2017 MV: EF 72%, no ischemia/infarct-->Low risk.  . Cancer of left lung ([place]) 2016   a. 12/2015 LUL lung resection.  . Chronic Dyspnea on exertion   . CKD (chronic kidney disease) stage 3, GFR 30-59 ml/min ([place]) 04/09/2017   a. 04/2017 ARF & Hyperkalemia req HD x 1.  . 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 Diverticulitis   . Essential hypertension 04/09/2017  . Family history of adverse reaction to anesthesia   . History of sebaceous cyst 02/28/2017  . History of stress test   . Intermittent vertigo 04/09/2017  . Itching 02/28/2017  . Left Atrial Appendage Thrombus    a. Incidentally noted on CT 04/2017;  b. 04/2017 Echo: EF 65-70%, Gr1 DD, no LA filling defect seen; c. 05/2017 TEE: EF 60-65%, no RA/LA thrombus; d. Later found to have PAF on Event monitor 06/2017-->initially placed on eliquis; changed to coumadin 2/2 $.  . PAF (paroxysmal atrial fibrillation) ([place])    a. Discovered 06/2017 on Event monitor as part of w/u for LAA thrombus. CHA2DS2VASc = 4-->Eliquis.  . Stroke [facility])    a. Pt reports prior h/o CVA; b. 07/2017 Head CT: Chronic atrophy and small vessel ischemic changes. No acute intracranial abnormalities.   Past Surgical History:  Procedure Laterality Date  .  ABDOMINAL HYSTERECTOMY    . cardiac bypass  1990  . carpel tunnel    . CHOLECYSTECTOMY    . COLON SURGERY    . COLONOSCOPY    . CT guided needle placement    . EXPLORATORY LAPAROTOMY    . GALLBLADDER SURGERY    . loop ileostomy    . SIGMOID RESECTION / RECTOPEXY    . TEE WITHOUT CARDIOVERSION N/A 05/04/2017   Procedure: TRANSESOPHAGEAL ECHOCARDIOGRAM (TEE);  Surgeon: Wellington Hampshire, MD;  Location: [facility] ORS;  Service: Cardiovascular;  Laterality: N/A;  . THORACOSCOPY    . vaginectomy      Allergies  Allergies  Allergen Reactions  . Penicillin G Hives and Itching    Has patient had a PCN reaction causing immediate rash, facial/tongue/throat swelling, SOB or lightheadedness with hypotension: Yes Has patient had a PCN reaction causing severe rash involving mucus membranes or skin necrosis: No Has patient had a PCN reaction that required hospitalization: No Has patient had a PCN reaction occurring within the last 10 years: Yes If all of the above answers are "NO", then may proceed with Cephalosporin use.  . Succinylcholine     Patient's daughter had emergency surgery and was awake for the entire procedure, was told she should not have succ's and neither should her mom     History of Present Illness    67 year old female with the above complex past medical history including CAD status post remote CABG in 1999, hypertension, hyperlipidemia, lung cancer status post left  upper lobe resection in 2016, chronic dyspnea, colovaginal fistula, stage III chronic kidney disease, and paroxysmal atrial fibrillation complicated by left atrial appendage thrombus.  In January 2017, she experienced diverticulitis resulting in colovaginal fistula which was then treated with exploratory laparotomy and sigmoidectomy fistula takedown with anastomosis, as well as diverting loop ileostomy in [place].  She subsequently moved to [place] in June 2018 was evaluated for ileostomy takedown.  As part  of that evaluation, CT of the chest, abdomen, and pelvis was performed and this incidentally revealed a left atrial appendage thrombus.  This was followed by echocardiogram and TEE, neither of which confirming presence of left atrial appendage.  Regardless, decision was made to place her on Eliquis and an event monitor was subsequently placed.  Event monitoring eventually showed paroxysmal atrial fibrillation.  Patient was recently hospitalized with small bowel obstruction in February and it was discovered at that point, that she had actually been off of Eliquis for at least 3 months in the setting of an inability to afford it.  After discussion with patient, it was felt that warfarin should be initiated.  She was eventually discharged home on warfarin and Toprol-XL.  Unfortunately, though being discharged over a month ago, she has yet to pick up either her beta-blocker or warfarin.  She says she plans on doing that today.  She has a follow-up appointment with surgery this afternoon for further discussion with regards to ileostomy takedown.  From a cardiac standpoint, she has chronic dyspnea on exertion which has not changed in over a year.  Of note, she did have a low risk stress test in August 2018.  She does not experience chest pain and denies PND, orthopnea, dizziness, syncope, edema, or early satiety.  She walks some with a cane and can generally walk on a flat surface without significant dyspnea or other symptoms but does become short of breath if having to walk up any sort of incline.  Home Medications    Prior to Admission medications   Medication Sig Start Date End Date Taking? Authorizing Provider  acetaminophen (TYLENOL) 500 MG tablet Take 500 mg by mouth every 6 (six) hours as needed.   Yes [provider]  albuterol (PROVENTIL HFA;VENTOLIN HFA) 108 (90 Base) MCG/ACT inhaler Inhale 2 puffs into the lungs every 6 (six) hours as needed. 04/13/17 04/13/18 Yes [provider]    aspirin EC 81 MG tablet Take 1 tablet by mouth daily. 04/09/17 04/09/18 Yes [provider]  BREO ELLIPTA 100-25 MCG/INH AEPB Inhale 1 puff into the lungs 2 (two) times daily.   Yes [provider]  metoprolol succinate (TOPROL-XL) 25 MG 24 hr tablet Take 1 tablet (25 mg total) by mouth daily. 01/04/18 01/04/19 Yes Mody, Ulice Bold, MD  ondansetron (ZOFRAN ODT) 4 MG disintegrating tablet Take 1 tablet (4 mg total) by mouth every 8 (eight) hours as needed for nausea or vomiting. 12/26/17  Yes Earleen Newport, MD  pravastatin (PRAVACHOL) 10 MG tablet Take 10 mg by mouth at bedtime.   Yes [provider]  warfarin (COUMADIN) 5 MG tablet Take 1 tablet daily Patient not taking: Reported on 02/10/2018 01/04/18   Bettey Costa, MD    Review of Systems    As above, chronic dyspnea on exertion.  She occasionally notes abdominal discomfort in the area of her ileostomy and also has noted some skin breakdown related to the ileostomy site.  This can sometimes be bothersome.  She denies chest pain, palpitations, PND, orthopnea,  dizziness, syncope, edema, or early satiety.  All other systems reviewed and are otherwise negative except as noted above.  Physical Exam    VS:  BP (!) 157/103 (BP Location: Left Arm, Patient Position: Sitting, Cuff Size: Normal)   Pulse 96   Ht 5\' 4"  (1.626 m)   Wt 142 lb 8 oz (64.6 kg)   SpO2 95%   BMI 24.46 kg/m  , BMI Body mass index is 24.46 kg/m. GEN: Well nourished, well developed, in no acute distress.  HEENT: Notable for poor dentition.  Otherwise normal. Neck: Supple, no JVD, carotid bruits, or masses. Cardiac: RRR, no murmurs, rubs, or gallops. No clubbing, cyanosis, edema.  Radials/DP/PT 1+ and equal bilaterally.  Respiratory:  Respirations regular and unlabored, clear to auscultation bilaterally. GI: Soft, nontender, nondistended, BS + x 4.  Right lower quadrant ostomy. MS: no deformity or atrophy. Skin: warm and dry, no rash. Neuro:  Strength and  sensation are intact. Psych: Normal affect.  Accessory Clinical Findings    ECG -regular sinus rhythm, 96, prior septal infarct, nonspecific T changes.  Assessment & Plan    1.  Paroxysmal atrial fibrillation/history of left atrial appendage thrombus: As noted above, patient was incidentally noted to have a left atrial appendage thrombus on a CT of her chest, abdomen, and pelvis in the summer 2018.  Follow-up echo and TEE was unable to corroborate this.  Regardless, she was later found to have paroxysmal atrial fibrillation on event monitoring and recommendation was made for oral anticoagulation.  She was on Eliquis for a short period of time but it was recently discovered that she had come off of it greater than 3 months ago in the setting of an inability to afford it.  During recent hospitalization for small bowel obstruction, we saw her and recommended initiation of warfarin at discharge.  Patient was prescribed both warfarin and Toprol however, she has not picked them up.  It has been over a month since discharge.  She says she will get them today.  She understands her stroke risk related to PAF and does wish to take anticoagulation.  Assuming she gets it today, we will arrange for follow-up in our Coumadin clinic in 1 week.  I did stress the importance of compliance with oral anticoagulation as well as compliance with follow-up.  Given a CHA2DS2VASc of 6 with previous finding of left atrial appendage thrombus, she will require bridging when coming off of Coumadin for surgery.  She is currently pending loop ileostomy takedown.  I spoke with Dr. Hampton Abbot today his hope is to perform the takedown in approximately 4 weeks but she will require GI evaluation.  And colonoscopy prior to that.  We will have to work out bridging details through our Coumadin clinic when the time comes.  2.  Coronary artery disease/preoperative cardiovascular examination: She is status post CABG in 1991 in [place].  She is  chronic dyspnea on exertion but has not been having any chest pain.  Activity is relatively limited though she does walk some.  Regardless, she had stress testing in August 2018 which was low risk, showing normal LV function without evidence of ischemia or infarct.  She has not had any change in her functional status since stress testing.  In that setting, she may proceed to surgery without further cardiac testing.  As above, she will need bridging with Lovenox.  I have strongly encouraged her to start beta-blocker therapy today as this was previous prescribed.  She should also  remain on statin and low-dose aspirin throughout the perioperative period.  3.  Essential hypertension: Blood pressure elevated today.  She has not yet started beta-blocker and I have advised her to initiate this today.  She does have a blood pressure cuff at home and I have asked her to follow her pressures regularly and contact us if systolics are greater than 140.  4.  Hyperlipidemia: She is on low-dose Pravachol therapy.  We do not have any lipids on file.  LFTs were normal in February.  She will need follow-up lipids at some point down the road.  5.  Ileostomy pending takedown: See #2.  6.  Disposition: Patient will initiate metoprolol and Coumadin today.  Follow-up in Coumadin clinic in 1 week.  Follow-up with Dr. Saunders Revel in 2-3 months.  She will require bridging with Lovenox perioperatively.   Murray Hodgkins, NP 02/10/2018, 11:49 AM

## 2018-02-10 NOTE — Telephone Encounter (Signed)
Patient saw Ignacia Bayley, NP today, routing to him for clearance.

## 2018-02-10 NOTE — Addendum Note (Signed)
Addended by: Riki Sheer on: 02/10/2018 12:49 PM   Modules accepted: Orders, SmartSet

## 2018-02-12 ENCOUNTER — Telehealth: Payer: Self-pay | Admitting: Surgery

## 2018-02-12 ENCOUNTER — Telehealth: Payer: Self-pay | Admitting: Nurse Practitioner

## 2018-02-12 MED ORDER — APIXABAN 5 MG PO TABS: 5.0000 mg | ORAL_TABLET | Freq: Two times a day (BID) | ORAL | 0 refills | Status: DC

## 2018-02-12 NOTE — Telephone Encounter (Signed)
Pt advised of pre op date/time and sx date. Sx: 03/10/18 with Dr Cherrie Gauze takedown, parastomal hernia repair.  Pre op: 03/03/18 @ 10:30am--phone interview.   Patient made aware to call [phone], between 1-3:00pm the day before surgery, to find out what time to arrive.

## 2018-02-12 NOTE — Telephone Encounter (Signed)
With procedure coming up pt would need lovenox bridge based on LV thrombus and history of stroke. Unfortunately we will not be able to coordinate a lovenox bridge for her due to timing and patient understanding. She does not think that she would be able to give injections without training.   Spoke with Dr. Saunders Revel and he agrees that we can restart Eliquis until after procedure and then coordinate change to warfarin. Was able to get 2 weeks of samples from [facility], but patient has no transportation to get samples.   Spoke with patient daughter, Ozella Almond, who reports that [facility] will deliver the medication to their home and they believe that they would be able to afford a 2 week supply to keep pt protected from clot until after procedure and warfarin can be started.   Advised Janie to have the patient take Eliquis 5mg  TWICE daily starting as soon as they receive medication. She should continue Eliquis with last dose on 4/14 Evening prior to procedure on 4/17 (2 day hold per Dr. Saunders Revel). Advised that pt should resume Eliquis 5mg  TWICE daily on 4/18 unless advised otherwise by surgeon. She will continue Eliquis 5mg  TWICE daily until 4/23. She is to start warfarin on 4/21 with New warfarin appt scheduled on 4/24 in our [place] office.

## 2018-02-12 NOTE — Telephone Encounter (Signed)
Pt calling stating since she has not started her coumadin  Does she need to come to appointment for coumadin next week She states she is having Colonoscopy done next week  Please call back

## 2018-02-15 ENCOUNTER — Telehealth: Payer: Self-pay | Admitting: Internal Medicine

## 2018-02-15 ENCOUNTER — Telehealth: Payer: Self-pay | Admitting: Gastroenterology

## 2018-02-15 MED ORDER — PEG 3350-KCL-NA BICARB-NACL 420 G PO SOLR: 4000.0000 mL | Freq: Once | ORAL | 0 refills | Status: AC

## 2018-02-15 MED ORDER — BISACODYL 5 MG PO TBEC: 10.0000 mg | DELAYED_RELEASE_TABLET | Freq: Once | ORAL | 0 refills | Status: AC

## 2018-02-15 NOTE — Telephone Encounter (Signed)
RX for Golytely and Dulcolax tabs. Sent to [facility] per pt request.

## 2018-02-15 NOTE — Telephone Encounter (Deleted)
Pt c/o medication issue:  1. Name of Medication: Eliquis  2. How are you currently taking this medication (dosage and times per day)? 5 mg po BID  3. Are you having a reaction (difficulty breathing--STAT)? no  4. What is your medication issue? Cannot afford to start taking has not been taking blood thinner at all

## 2018-02-15 NOTE — Telephone Encounter (Signed)
Pt states she has colonoscopy wedneday and still does not have her rx please call pt

## 2018-02-15 NOTE — Telephone Encounter (Deleted)
Error see previous phone note

## 2018-02-15 NOTE — Telephone Encounter (Signed)
Thank you for the update.  Bracken Moffa, MD [facility] Pager: [phone]  

## 2018-02-15 NOTE — Telephone Encounter (Signed)
S/w patient and daughter. Patient was not able to afford to get the Eliquis as planned last week. It was going to cost over $200. Thus patient has not been on anything since running out of coumadin. Advised I could leave samples for patient to take after the procedure as previously discussed. They were agreeable to plan. They have not gotten the warfarin yet but plan to have the pharmacy deliver it soon. Daughter verbalized understanding of plan as discussed with Eino Farber, PHarmD last week. Routing to Dr End to make him aware.   Medication Samples have been provided to the patient.  Drug name: Eliquis       Strength: 5 mg        Qty: 1 box  LOT: [id number]  Exp.Date: [id number]

## 2018-02-15 NOTE — Telephone Encounter (Signed)
Patient calling to see what to do about blood thinner options.  See previous note.  Patient snow states she cannot afford to fill rx for eliquis and has not been on any blood thinner at all.  Please call.

## 2018-02-16 ENCOUNTER — Telehealth: Payer: Self-pay | Admitting: Gastroenterology

## 2018-02-16 NOTE — Telephone Encounter (Signed)
Pt calls concerned because she has not received her colostomy bags, which are supposed to be there today. Aware of how to take prep. The bag she has is tore. I gave pt a couple of medical supply stores that may could help such as [employer]. To call and see if they have her size and can she purchase a couple of those. The next issue is getting someone to pick this up for her. I advised pt if she has to cancel after office hours, to contact [facility] and let them know.

## 2018-02-16 NOTE — Telephone Encounter (Signed)
Pt left vm in regards to procedure tomorrow

## 2018-02-17 ENCOUNTER — Telehealth: Payer: Self-pay

## 2018-02-17 ENCOUNTER — Ambulatory Visit: Admission: RE | Admit: 2018-02-17 | Payer: Medicare Other | Source: Ambulatory Visit | Admitting: Gastroenterology

## 2018-02-17 ENCOUNTER — Encounter: Admission: RE | Payer: Self-pay | Source: Ambulatory Visit

## 2018-02-17 SURGERY — COLONOSCOPY WITH PROPOFOL
Anesthesia: General

## 2018-02-17 NOTE — Telephone Encounter (Signed)
I have spoke with daughter off/on today regarding pt vomiting all night (canceled her Colonoscopy-ostomy  procedure this am). Pt stated the prep made her sick and only drank 1/4 of it. Bile green, stoma raw. The last time pt. Stated that she got a hernia, tear and stool leaked out. Her B/P this am 142/119, temp. 96.1 (orally). I spoke with Dr. Vicente Males and he stated to let Dr. Bonna Gains know, then after speaking to Dr. Bonna Gains (doing procedures) she said let Dr. Marius Ditch know. Message sent to Dr. Marius Ditch per her request to review.

## 2018-02-18 ENCOUNTER — Other Ambulatory Visit: Payer: Self-pay

## 2018-02-18 DIAGNOSIS — Z433 Encounter for attention to colostomy: Secondary | ICD-10-CM

## 2018-02-18 MED ORDER — PEG 3350-KCL-NA BICARB-NACL 420 G PO SOLR: 4000.0000 mL | Freq: Once | ORAL | 0 refills | Status: AC

## 2018-02-18 NOTE — Telephone Encounter (Signed)
Pt states she continues not to feel well but is better than yesterday with n/v. I explained that this prep is not taken by mouth in her case, at this time. This is done in the hospital and she would need to arrive 4 hrs prior to procedure. Pt understands.  Pt states she has not started Coumadin. I spoke with pt and the only day she can do this is a Wednesday, of which we are looking at 03/03/18, (colonoscopy needs to be done by 5/8).  She has several doctor appts on 4/24 so this was not an option. Pt needs to have Golytely prep with GI lab 4 hrs prior to procedure, per Dr. Marius Ditch. After checking with both [facility], and [facility], approval for the prep and colonoscopy was given by [facility], RN at [facility].  Trish from [facility] advised me of this.

## 2018-02-18 NOTE — Addendum Note (Signed)
Addended by: Earl Lagos on: 02/18/2018 04:13 PM   Modules accepted: Orders

## 2018-02-22 ENCOUNTER — Telehealth: Payer: Self-pay | Admitting: Gastroenterology

## 2018-02-22 NOTE — Telephone Encounter (Signed)
Debbie from [facility] for Jackelyn Poling she states they do not do illemoscopies in the [place] office for pt Procedure for May 1st it needs to be done at [place] she cancelled the procedure please reschedule for [facility]

## 2018-02-22 NOTE — Telephone Encounter (Signed)
I called pt to verify 03/03/18 as procedure date and she agreed. See note that this was approved to have in [place] for 02/18/18. Dr. Marius Ditch is aware she will need to either be there early or show staff what to do.

## 2018-02-24 ENCOUNTER — Telehealth: Payer: Self-pay

## 2018-02-24 NOTE — Telephone Encounter (Signed)
Pt did not show for her New Coumadin appt today to est care & go over Lovenox bridging instructions. Left message for pt to call if she can come in @ 2:30 this afternoon.

## 2018-02-24 NOTE — Telephone Encounter (Signed)
Patient has been asked to report to [facility] on May 1st at 11am per Dr. Marius Ditch.  Thanks [employer]

## 2018-02-24 NOTE — Telephone Encounter (Signed)
Pt did not show up for 2:30 appt or call back to reschedule.

## 2018-03-01 ENCOUNTER — Telehealth: Payer: Self-pay

## 2018-03-01 ENCOUNTER — Other Ambulatory Visit: Payer: Self-pay

## 2018-03-01 DIAGNOSIS — Z433 Encounter for attention to colostomy: Secondary | ICD-10-CM

## 2018-03-01 MED ORDER — PEG 3350-KCL-NA BICARB-NACL 420 G PO SOLR: ORAL | 0 refills | Status: DC

## 2018-03-01 NOTE — Telephone Encounter (Signed)
Can we provide her with sample?   Thanks  [employer]

## 2018-03-01 NOTE — Telephone Encounter (Signed)
Patient stated she does not have the money for her bowel prep.  Thanks [employer]

## 2018-03-02 NOTE — Telephone Encounter (Signed)
Patient has been asked to stop by the office the morning of her procedure to pick up a sample of Su-Prep.  Thanks [employer]

## 2018-03-03 ENCOUNTER — Other Ambulatory Visit: Payer: Self-pay

## 2018-03-03 ENCOUNTER — Ambulatory Visit: Payer: Medicare Other | Admitting: Certified Registered"

## 2018-03-03 ENCOUNTER — Inpatient Hospital Stay: Admission: RE | Admit: 2018-03-03 | Payer: Medicare Other | Source: Ambulatory Visit

## 2018-03-03 ENCOUNTER — Encounter
Admission: RE | Disposition: A | Discharge status: Home / Self Care | Payer: Self-pay | Source: Ambulatory Visit | Attending: Gastroenterology

## 2018-03-03 ENCOUNTER — Ambulatory Visit
Admission: RE | Admit: 2018-03-03 | Discharge: 2018-03-03 | Disposition: A | Discharge status: Home / Self Care | Payer: Medicare Other | Source: Ambulatory Visit | Attending: Gastroenterology | Admitting: Gastroenterology

## 2018-03-03 ENCOUNTER — Ambulatory Visit: Admit: 2018-03-03 | Payer: Medicare Other | Admitting: Gastroenterology

## 2018-03-03 DIAGNOSIS — Z79899 Other long term (current) drug therapy: Secondary | ICD-10-CM | POA: Diagnosis not present

## 2018-03-03 DIAGNOSIS — N183 Chronic kidney disease, stage 3 (moderate): Secondary | ICD-10-CM | POA: Insufficient documentation

## 2018-03-03 DIAGNOSIS — R42 Dizziness and giddiness: Secondary | ICD-10-CM | POA: Insufficient documentation

## 2018-03-03 DIAGNOSIS — Z1211 Encounter for screening for malignant neoplasm of colon: Secondary | ICD-10-CM | POA: Diagnosis not present

## 2018-03-03 DIAGNOSIS — Z980 Intestinal bypass and anastomosis status: Secondary | ICD-10-CM | POA: Insufficient documentation

## 2018-03-03 DIAGNOSIS — Z888 Allergy status to other drugs, medicaments and biological substances status: Secondary | ICD-10-CM | POA: Diagnosis not present

## 2018-03-03 DIAGNOSIS — I480 Paroxysmal atrial fibrillation: Secondary | ICD-10-CM | POA: Insufficient documentation

## 2018-03-03 DIAGNOSIS — K259 Gastric ulcer, unspecified as acute or chronic, without hemorrhage or perforation: Secondary | ICD-10-CM | POA: Insufficient documentation

## 2018-03-03 DIAGNOSIS — Z86718 Personal history of other venous thrombosis and embolism: Secondary | ICD-10-CM | POA: Diagnosis not present

## 2018-03-03 DIAGNOSIS — K257 Chronic gastric ulcer without hemorrhage or perforation: Secondary | ICD-10-CM

## 2018-03-03 DIAGNOSIS — K7460 Unspecified cirrhosis of liver: Secondary | ICD-10-CM | POA: Diagnosis not present

## 2018-03-03 DIAGNOSIS — D175 Benign lipomatous neoplasm of intra-abdominal organs: Secondary | ICD-10-CM | POA: Diagnosis not present

## 2018-03-03 DIAGNOSIS — Z880 Allergy status to penicillin: Secondary | ICD-10-CM | POA: Insufficient documentation

## 2018-03-03 DIAGNOSIS — F17200 Nicotine dependence, unspecified, uncomplicated: Secondary | ICD-10-CM | POA: Diagnosis not present

## 2018-03-03 DIAGNOSIS — Z8673 Personal history of transient ischemic attack (TIA), and cerebral infarction without residual deficits: Secondary | ICD-10-CM | POA: Insufficient documentation

## 2018-03-03 DIAGNOSIS — Z7982 Long term (current) use of aspirin: Secondary | ICD-10-CM | POA: Insufficient documentation

## 2018-03-03 DIAGNOSIS — Z7951 Long term (current) use of inhaled steroids: Secondary | ICD-10-CM | POA: Insufficient documentation

## 2018-03-03 DIAGNOSIS — Z951 Presence of aortocoronary bypass graft: Secondary | ICD-10-CM | POA: Insufficient documentation

## 2018-03-03 DIAGNOSIS — I129 Hypertensive chronic kidney disease with stage 1 through stage 4 chronic kidney disease, or unspecified chronic kidney disease: Secondary | ICD-10-CM | POA: Diagnosis not present

## 2018-03-03 DIAGNOSIS — Z90710 Acquired absence of both cervix and uterus: Secondary | ICD-10-CM | POA: Diagnosis not present

## 2018-03-03 DIAGNOSIS — Z9049 Acquired absence of other specified parts of digestive tract: Secondary | ICD-10-CM | POA: Insufficient documentation

## 2018-03-03 DIAGNOSIS — R0600 Dyspnea, unspecified: Secondary | ICD-10-CM | POA: Insufficient documentation

## 2018-03-03 DIAGNOSIS — I2510 Atherosclerotic heart disease of native coronary artery without angina pectoris: Secondary | ICD-10-CM | POA: Insufficient documentation

## 2018-03-03 DIAGNOSIS — Z7901 Long term (current) use of anticoagulants: Secondary | ICD-10-CM | POA: Insufficient documentation

## 2018-03-03 DIAGNOSIS — K2950 Unspecified chronic gastritis without bleeding: Secondary | ICD-10-CM | POA: Insufficient documentation

## 2018-03-03 DIAGNOSIS — K279 Peptic ulcer, site unspecified, unspecified as acute or chronic, without hemorrhage or perforation: Secondary | ICD-10-CM

## 2018-03-03 DIAGNOSIS — K3189 Other diseases of stomach and duodenum: Secondary | ICD-10-CM | POA: Diagnosis not present

## 2018-03-03 DIAGNOSIS — Z433 Encounter for attention to colostomy: Secondary | ICD-10-CM

## 2018-03-03 DIAGNOSIS — K6389 Other specified diseases of intestine: Secondary | ICD-10-CM

## 2018-03-03 HISTORY — PX: ESOPHAGOGASTRODUODENOSCOPY (EGD) WITH PROPOFOL: SHX5813

## 2018-03-03 HISTORY — DX: Dyspnea, unspecified: R06.00

## 2018-03-03 HISTORY — PX: COLONOSCOPY WITH PROPOFOL: SHX5780

## 2018-03-03 SURGERY — COLONOSCOPY WITH PROPOFOL
Anesthesia: General

## 2018-03-03 SURGERY — COLONOSCOPY WITH PROPOFOL
Anesthesia: Choice

## 2018-03-03 MED ORDER — PROPOFOL 500 MG/50ML IV EMUL
INTRAVENOUS | Status: DC | PRN
  Administered 2018-03-03: 150 ug/kg/min via INTRAVENOUS

## 2018-03-03 MED ORDER — PEG 3350-KCL-NA BICARB-NACL 420 G PO SOLR
4000.0000 mL | Freq: Once | ORAL | Status: AC
  Administered 2018-03-03: 4000 mL via ORAL
  Filled 2018-03-03: qty 4000

## 2018-03-03 MED ORDER — LIDOCAINE HCL (PF) 2 % IJ SOLN
INTRAMUSCULAR | Status: AC
  Filled 2018-03-03: qty 10

## 2018-03-03 MED ORDER — SODIUM CHLORIDE 0.9 % IV SOLN
INTRAVENOUS | Status: DC
  Administered 2018-03-03 (×2): via INTRAVENOUS

## 2018-03-03 MED ORDER — GLYCOPYRROLATE 0.2 MG/ML IJ SOLN
INTRAMUSCULAR | Status: DC | PRN
  Administered 2018-03-03: 0.2 mg via INTRAVENOUS

## 2018-03-03 MED ORDER — OMEPRAZOLE 20 MG PO CPDR: 20.0000 mg | DELAYED_RELEASE_CAPSULE | Freq: Two times a day (BID) | ORAL | 1 refills | Status: AC

## 2018-03-03 MED ORDER — LIDOCAINE 2% (20 MG/ML) 5 ML SYRINGE
INTRAMUSCULAR | Status: DC | PRN
  Administered 2018-03-03: 100 mg via INTRAVENOUS

## 2018-03-03 MED ORDER — PROPOFOL 10 MG/ML IV BOLUS
INTRAVENOUS | Status: DC | PRN
  Administered 2018-03-03: 70 mg via INTRAVENOUS

## 2018-03-03 MED ORDER — PROPOFOL 500 MG/50ML IV EMUL
INTRAVENOUS | Status: AC
  Filled 2018-03-03: qty 50

## 2018-03-03 NOTE — OR Nursing (Signed)
Completed GoLytely bowel prep, last stool clear and watery.

## 2018-03-03 NOTE — OR Nursing (Signed)
Removed syringe from foley catheter to allow fluid to flow from it, 60 ml yellow fluid has drained so far. Pt reports upper abdominal cramping is better, still feels nauseated. Pt has received a total of 800 ml NS IVF bolus, fluid has been slowed down to 50 ml/hr

## 2018-03-03 NOTE — Anesthesia Post-op Follow-up Note (Signed)
Anesthesia QCDR form completed.        

## 2018-03-03 NOTE — Op Note (Signed)
[facility] Patient Name: Carly Khan Procedure Date: 03/03/2018 4:13 PM MRN: [id number] Account #: [id number] Date of Birth: [birth year] Admit Type: Outpatient Age: 67 Room: [facility] ENDO ROOM 1 Gender: Female Note Status: Finalized Procedure:            Upper GI endoscopy Indications:          Cirrhosis rule out esophageal varices Providers:            Lin Landsman MD, MD Referring MD:         Glendon Axe (Referring MD) Medicines:            Monitored Anesthesia Care Complications:        No immediate complications. Estimated blood loss:                        Minimal. Procedure:            Pre-Anesthesia Assessment:                       - Prior to the procedure, a History and Physical was                        performed, and patient medications and allergies were                        reviewed. The patient is competent. The risks and                        benefits of the procedure and the sedation options and                        risks were discussed with the patient. All questions                        were answered and informed consent was obtained.                        Patient identification and proposed procedure were                        verified by the physician, the nurse, the                        anesthesiologist, the anesthetist and the technician in                        the pre-procedure area in the procedure room in the                        endoscopy suite. Mental Status Examination: alert and                        oriented. Airway Examination: normal oropharyngeal                        airway and neck mobility. Respiratory Examination:                        clear to auscultation. CV Examination: normal.  Prophylactic Antibiotics: The patient does not require                        prophylactic antibiotics. Prior Anticoagulants: The                        patient has taken Coumadin  (warfarin), last dose was 21                        days prior to procedure. ASA Grade Assessment: III - A                        patient with severe systemic disease. After reviewing                        the risks and benefits, the patient was deemed in                        satisfactory condition to undergo the procedure. The                        anesthesia plan was to use monitored anesthesia care                        (MAC). Immediately prior to administration of                        medications, the patient was re-assessed for adequacy                        to receive sedatives. The heart rate, respiratory rate,                        oxygen saturations, blood pressure, adequacy of                        pulmonary ventilation, and response to care were                        monitored throughout the procedure. The physical status                        of the patient was re-assessed after the procedure.                       After obtaining informed consent, the endoscope was                        passed under direct vision. Throughout the procedure,                        the patient's blood pressure, pulse, and oxygen                        saturations were monitored continuously. The Endoscope                        was introduced through the mouth, and advanced to the  second part of duodenum. The upper GI endoscopy was                        accomplished without difficulty. The patient tolerated                        the procedure well. Findings:      The duodenal bulb and second portion of the duodenum were normal.      Multiple dispersed, diminutive non-bleeding erosions were found in the       entire examined stomach. There were stigmata of recent bleeding.       Biopsies were taken with a cold forceps for Helicobacter pylori testing.      One non-bleeding superficial gastric ulcer with a clean ulcer base       (Forrest Class III) was found in  the gastric antrum. The lesion was 5 mm       in largest dimension.      The gastroesophageal junction and examined esophagus were normal. Impression:           - Normal duodenal bulb and second portion of the                        duodenum.                       - Non-bleeding erosive gastropathy. Biopsied.                       - Non-bleeding gastric ulcer with a clean ulcer base                        (Forrest Class III).                       - Normal gastroesophageal junction and esophagus. Recommendation:       - Await pathology results.                       - No ibuprofen, naproxen, or other non-steroidal                        anti-inflammatory drugs.                       - Use a proton pump inhibitor PO BID long term. Procedure Code(s):    --- Professional ---                       [phone], Esophagogastroduodenoscopy, flexible, transoral;                        with biopsy, single or multiple Diagnosis Code(s):    --- Professional ---                       K31.89, Other diseases of stomach and duodenum                       K25.9, Gastric ulcer, unspecified as acute or chronic,                        without hemorrhage or perforation  K74.60, Unspecified cirrhosis of liver CPT copyright 2017 American Medical Association. All rights reserved. The codes documented in this report are preliminary and upon coder review may  be revised to meet current compliance requirements. Dr. Ulyess Mort Lin Landsman MD, MD 03/03/2018 4:31:19 PM This report has been signed electronically. Number of Addenda: 0 Note Initiated On: 03/03/2018 4:13 PM      [facility]

## 2018-03-03 NOTE — Op Note (Signed)
[facility] Patient Name: Carly Khan Procedure Date: 03/03/2018 3:59 PM MRN: [id number] Account #: [id number] Date of Birth: [birth year] Admit Type: Outpatient Age: 67 Room: [facility] ENDO ROOM 1 Gender: Female Note Status: Finalized Procedure:            Colonoscopy Indications:          Screening for colorectal malignant neoplasm, This is                        the patient's first colonoscopy Providers:            Lin Landsman MD, MD Referring MD:         Glendon Axe (Referring MD) Medicines:            Monitored Anesthesia Care Complications:        No immediate complications. Estimated blood loss: None. Procedure:            Pre-Anesthesia Assessment:                       - Prior to the procedure, a History and Physical was                        performed, and patient medications and allergies were                        reviewed. The patient is competent. The risks and                        benefits of the procedure and the sedation options and                        risks were discussed with the patient. All questions                        were answered and informed consent was obtained.                        Patient identification and proposed procedure were                        verified by the physician, the nurse, the                        anesthesiologist, the anesthetist and the technician in                        the pre-procedure area in the procedure room in the                        endoscopy suite. Mental Status Examination: alert and                        oriented. Airway Examination: normal oropharyngeal                        airway and neck mobility. Respiratory Examination:                        clear to auscultation. CV Examination: normal.  Prophylactic Antibiotics: The patient does not require                        prophylactic antibiotics. Prior Anticoagulants: The     patient has taken Coumadin (warfarin), last dose was 21                        days prior to procedure. ASA Grade Assessment: III - A                        patient with severe systemic disease. After reviewing                        the risks and benefits, the patient was deemed in                        satisfactory condition to undergo the procedure. The                        anesthesia plan was to use monitored anesthesia care                        (MAC). Immediately prior to administration of                        medications, the patient was re-assessed for adequacy                        to receive sedatives. The heart rate, respiratory rate,                        oxygen saturations, blood pressure, adequacy of                        pulmonary ventilation, and response to care were                        monitored throughout the procedure. The physical status                        of the patient was re-assessed after the procedure.                       After obtaining informed consent, the colonoscope was                        passed under direct vision. Throughout the procedure,                        the patient's blood pressure, pulse, and oxygen                        saturations were monitored continuously. The                        Colonoscope was introduced through the anus and                        advanced to the the terminal ileum. The was introduced  through the anus and advanced to the the terminal                        ileum. The colonoscopy was performed without                        difficulty. The patient tolerated the procedure well.                        The quality of the bowel preparation was evaluated                        using the BBPS [facility] Bowel Preparation Scale) with                        scores of: Right Colon = 3, Transverse Colon = 3 and                        Left Colon = 3 (entire mucosa seen well with no                         residual staining, small fragments of stool or opaque                        liquid). The total BBPS score equals 9. Findings:      The perianal and digital rectal examinations were normal. Pertinent       negatives include normal sphincter tone and no palpable rectal lesions.      The terminal ileum appeared normal.      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      There was a lipoma, 9 mm in diameter, in the ascending colon.      There was evidence of a prior functional end-to-end colo-colonic       anastomosis at 15 cm proximal to the anus. This was patent and was       characterized by healthy appearing mucosa. The anastomosis was       traversed. Retroflexion in rectum was not performed due to short rectum Impression:           - The examined portion of the ileum was normal.                       - Erythematous mucosa in the entire examined colon.                       - Lipoma in the ascending colon.                       - Patent functional end-to-end colo-colonic                        anastomosis, characterized by healthy appearing mucosa.                       - No specimens collected. Recommendation:       - Discharge patient to home (with escort).                       -  Resume previous diet today.                       - Continue present medications.                       - Repeat colonoscopy in 10 years for surveillance.                       - Proceed with ileostomy takedown as scheduled Procedure Code(s):    --- Professional ---                       R4854, Colorectal cancer screening; colonoscopy on                        individual not meeting criteria for high risk Diagnosis Code(s):    --- Professional ---                       Z12.11, Encounter for screening for malignant neoplasm                        of colon                       K63.89, Other specified diseases of  intestine                       D17.5, Benign lipomatous neoplasm of intra-abdominal                        organs                       Z98.0, Intestinal bypass and anastomosis status CPT copyright 2017 American Medical Association. All rights reserved. The codes documented in this report are preliminary and upon coder review may  be revised to meet current compliance requirements. Dr. Ulyess Mort Lin Landsman MD, MD 03/03/2018 4:47:14 PM This report has been signed electronically. Number of Addenda: 0 Note Initiated On: 03/03/2018 3:59 PM Scope Withdrawal Time: 0 hours 5 minutes 56 seconds  Total Procedure Duration: 0 hours 8 minutes 14 seconds       [facility]

## 2018-03-03 NOTE — OR Nursing (Signed)
Dr. Marius Ditch into see pt, 1st foley inserted by Dr. Alice Reichert balloon deflated and removed.  Dr. Marius Ditch inserted the 16 fr foley catheter inserted into lower stoma, GoLytely given by gravity from enema bag leaked at connection site.  Then tried attaching irrigation syringe and allow the GoLytely flow by gravity and/or gentle pressure with syringe.

## 2018-03-03 NOTE — Anesthesia Preprocedure Evaluation (Signed)
Anesthesia Evaluation  Patient identified by MRN, date of birth, ID band Patient awake    Reviewed: Allergy & Precautions, H&P , NPO status , Patient's Chart, lab work & pertinent test results, reviewed documented beta blocker date and time   History of Anesthesia Complications Negative for: history of anesthetic complications  Airway Mallampati: I  TM Distance: >3 FB Neck ROM: full    Dental  (+) Poor Dentition, Missing, Dental Advidsory Given   Pulmonary shortness of breath and with exertion, neg sleep apnea, COPD,  COPD inhaler, neg recent URI, Current Smoker,           Cardiovascular Exercise Tolerance: Good hypertension, (-) angina+ CAD, + Past MI and + CABG  (-) Cardiac Stents negative cardio ROS  + dysrhythmias Atrial Fibrillation (-) Valvular Problems/Murmurs     Neuro/Psych neg Seizures TIACVA negative psych ROS   GI/Hepatic negative GI ROS, Neg liver ROS,   Endo/Other  negative endocrine ROS  Renal/GU CRFRenal disease  negative genitourinary   Musculoskeletal   Abdominal   Peds  Hematology negative hematology ROS (+)   Anesthesia Other Findings Past Medical History: 1991: CAD in native artery     Comment:  a. s/p CABG in 1991 in [place]; b. 06/2017 MV: EF 72%, no               ischemia/infarct-->Low risk. 2016: Cancer of left lung ([facility])     Comment:  a. 12/2015 LUL lung resection. No date: Chronic Dyspnea on exertion 04/09/2017: CKD (chronic kidney disease) stage 3, GFR 30-59 ml/min  (HCC)     Comment:  a. 04/2017 ARF & Hyperkalemia req HD x 1. 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  Incidentally noted on CT 04/2017;  b. 04/2017 Echo: EF               65-70%, Gr1 DD, no LA filling defect seen; c. 05/2017 TEE:              EF 60-65%, no RA/LA thrombus; d. Later found to have PAF               on Event monitor 06/2017-->initially placed on eliquis;               changed to coumadin 2/2 $. No date: PAF (paroxysmal atrial fibrillation) ([place])     Comment:  a. Discovered 06/2017 on Event monitor as part of w/u for              LAA thrombus. CHA2DS2VASc = 4-->Eliquis. No date: Stroke [facility])     Comment:  a. Pt reports prior h/o CVA; b. 07/2017 Head CT: Chronic               atrophy and small vessel ischemic changes. No acute               intracranial abnormalities.   Reproductive/Obstetrics negative OB ROS                             Anesthesia Physical Anesthesia Plan  ASA: III  Anesthesia Plan: General   Post-op Pain Management:    Induction: Intravenous  PONV Risk Score and Plan: 2  and Propofol infusion  Airway Management Planned: Natural Airway and Nasal Cannula  Additional Equipment:   Intra-op Plan:   Post-operative Plan:   Informed Consent: I have reviewed the patients History and Physical, chart, labs and discussed the procedure including the risks, benefits and alternatives for the proposed anesthesia with the patient or authorized representative who has indicated his/her understanding and acceptance.   Dental Advisory Given  Plan Discussed with: Anesthesiologist, CRNA and Surgeon  Anesthesia Plan Comments:         Anesthesia Quick Evaluation

## 2018-03-03 NOTE — Transfer of Care (Signed)
Immediate Anesthesia Transfer of Care Note  Patient: Carly Khan  Procedure(s) Performed: COLONOSCOPY WITH PROPOFOL  ileostomy takedown (N/A ) ESOPHAGOGASTRODUODENOSCOPY (EGD) WITH PROPOFOL  Patient Location: Endoscopy Unit  Anesthesia Type:General  Level of Consciousness: sedated  Airway & Oxygen Therapy: Patient connected to nasal cannula oxygen  Post-op Assessment: Post -op Vital signs reviewed and stable  Post vital signs: stable  Last Vitals:  Vitals Value Taken Time  BP 93/62 03/03/2018  4:46 PM  Temp 36.2 C 03/03/2018  4:46 PM  Pulse 78 03/03/2018  4:46 PM  Resp 24 03/03/2018  4:46 PM  SpO2 100 % 03/03/2018  4:46 PM    Last Pain:  Vitals:   03/03/18 1646  TempSrc: Tympanic  PainSc: 0-No pain         Complications: No apparent anesthesia complications

## 2018-03-03 NOTE — H&P (Signed)
Carly Darby, MD [address]  [place]  [place], [place] [address]  Main: [phone]  Fax: [phone] Pager: [phone]  Primary Care Physician:  Glendon Axe, MD Primary Gastroenterologist:  Dr. Cephas Khan  Pre-Procedure History & Physical: HPI:  Carly Khan is a 67 y.o. female is here for an EGD and colonoscopy.   Past Medical History:  Diagnosis Date  . CAD in native artery 1991   a. s/p CABG in 1991 in [place]; b. 06/2017 MV: EF 72%, no ischemia/infarct-->Low risk.  . Cancer of left lung ([place]) 2016   a. 12/2015 LUL lung resection.  . Chronic Dyspnea on exertion   . CKD (chronic kidney disease) stage 3, GFR 30-59 ml/min ([place]) 04/09/2017   a. 04/2017 ARF & Hyperkalemia req HD x 1.  . 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 Diverticulitis   . Diverticulitis   . Dyspnea   . Essential hypertension 04/09/2017  . Family history of adverse reaction to anesthesia   . History of sebaceous cyst 02/28/2017  . History of stress test   . Intermittent vertigo 04/09/2017  . Itching 02/28/2017  . Left Atrial Appendage Thrombus    a. Incidentally noted on CT 04/2017;  b. 04/2017 Echo: EF 65-70%, Gr1 DD, no LA filling defect seen; c. 05/2017 TEE: EF 60-65%, no RA/LA thrombus; d. Later found to have PAF on Event monitor 06/2017-->initially placed on eliquis; changed to coumadin 2/2 $.  . PAF (paroxysmal atrial fibrillation) ([place])    a. Discovered 06/2017 on Event monitor as part of w/u for LAA thrombus. CHA2DS2VASc = 4-->Eliquis.  . Stroke [facility])    a. Pt reports prior h/o CVA; b. 07/2017 Head CT: Chronic atrophy and small vessel ischemic changes. No acute intracranial abnormalities.    Past Surgical History:  Procedure Laterality Date  . ABDOMINAL HYSTERECTOMY    . cardiac bypass  1990  . carpel tunnel    . CHOLECYSTECTOMY    . COLON SURGERY    . COLONOSCOPY    . CORONARY ARTERY BYPASS GRAFT  1990's   x 4  . CT guided needle placement    . EXPLORATORY LAPAROTOMY    . GALLBLADDER SURGERY     . loop ileostomy    . SIGMOID RESECTION / RECTOPEXY    . TEE WITHOUT CARDIOVERSION N/A 05/04/2017   Procedure: TRANSESOPHAGEAL ECHOCARDIOGRAM (TEE);  Surgeon: Wellington Hampshire, MD;  Location: [facility] ORS;  Service: Cardiovascular;  Laterality: N/A;  . THORACOSCOPY    . vaginectomy      Prior to Admission medications   Medication Sig Start Date End Date Taking? Authorizing Provider  acetaminophen (TYLENOL) 500 MG tablet Take 500 mg by mouth every 6 (six) hours as needed (for pain).    Yes [provider]  albuterol (PROVENTIL HFA;VENTOLIN HFA) 108 (90 Base) MCG/ACT inhaler Inhale 2 puffs into the lungs every 6 (six) hours as needed. 04/13/17 04/13/18 Yes [provider]  BREO ELLIPTA 100-25 MCG/INH AEPB Inhale 1 puff into the lungs 2 (two) times daily.   Yes [provider]  metoprolol succinate (TOPROL-XL) 25 MG 24 hr tablet Take 1 tablet (25 mg total) by mouth daily. 01/04/18 01/04/19 Yes Mody, Ulice Bold, MD  pravastatin (PRAVACHOL) 10 MG tablet Take 10 mg by mouth daily.    Yes [provider]  apixaban (ELIQUIS) 5 MG TABS tablet Take 1 tablet (5 mg total) by mouth 2 (two) times daily. Patient not taking: Reported on 02/26/2018 02/12/18   End, Harrell Gave, MD  aspirin  EC 81 MG tablet Take 1 tablet by mouth at bedtime.  04/09/17 04/09/18  [provider]  ondansetron (ZOFRAN ODT) 4 MG disintegrating tablet Take 1 tablet (4 mg total) by mouth every 8 (eight) hours as needed for nausea or vomiting. Patient not taking: Reported on 02/26/2018 12/26/17   Earleen Newport, MD  polyethylene glycol (GOLYTELY) 236 g solution Drink one 8 oz glass every 20 mins until stools are clear Patient not taking: Reported on 02/26/2018 02/10/18   Lucilla Lame, MD  polyethylene glycol-electrolytes (NULYTELY/GOLYTELY) 420 g solution BRING GOLYTELTY WITH YOU TO PROCEDURE. DO NOT MIX. 03/01/18   Lin Landsman, MD  warfarin (COUMADIN) 5 MG tablet Take 1 tablet daily Patient taking  differently: Take 5 mg by mouth daily at 6 PM. Take 1 tablet daily 01/04/18   Bettey Costa, MD    Allergies as of 03/02/2018 - Review Complete 03/02/2018  Allergen Reaction Noted  . Penicillin g Hives and Itching 04/09/2017  . Succinylcholine  07/14/2017    Family History  Problem Relation Age of Onset  . Cancer Mother   . Stomach cancer Mother   . Heart disease Father   . Hypertension Father   . Parkinson's disease Sister   . Cervical cancer Sister     Social History   Socioeconomic History  . Marital status: Divorced    Spouse name: Not on file  . Number of children: Not on file  . Years of education: Not on file  . Highest education level: Not on file  Occupational History  . Not on file  Social Needs  . Financial resource strain: Not on file  . Food insecurity:    Worry: Not on file    Inability: Not on file  . Transportation needs:    Medical: Not on file    Non-medical: Not on file  Tobacco Use  . Smoking status: Current Every Day Smoker    Packs/day: 0.50    Years: 40.00    Pack years: 20.00  . Smokeless tobacco: Never Used  . Tobacco comment: denied smoking info  Substance and Sexual Activity  . Alcohol use: No  . Drug use: No  . Sexual activity: Never  Lifestyle  . Physical activity:    Days per week: Not on file    Minutes per session: Not on file  . Stress: Not on file  Relationships  . Social connections:    Talks on phone: Not on file    Gets together: Not on file    Attends religious service: Not on file    Active member of club or organization: Not on file    Attends meetings of clubs or organizations: Not on file    Relationship status: Not on file  . Intimate partner violence:    Fear of current or ex partner: Not on file    Emotionally abused: Not on file    Physically abused: Not on file    Forced sexual activity: Not on file  Other Topics Concern  . Not on file  Social History Narrative  . Not on file    Review of Systems: See  HPI, otherwise negative ROS  Physical Exam: BP 136/89   Pulse (!) 104   Temp 97.9 F (36.6 C) (Tympanic)   Resp 20   Ht 5\' 2"  (1.575 m)   Wt 147 lb (66.7 kg)   SpO2 99%   BMI 26.89 kg/m  General:   Alert,  pleasant and cooperative in NAD  Head:  Normocephalic and atraumatic. Neck:  Supple; no masses or thyromegaly. Lungs:  Clear throughout to auscultation.    Heart:  Regular rate and rhythm. Abdomen:  Soft, nontender and nondistended. Normal bowel sounds, without guarding, and without rebound.   Neurologic:  Alert and  oriented x4;  grossly normal neurologically.  Impression/Plan: Zehra Rucci is here for an EGD and colonoscopy to be performed for variceal screening and colon cancer screening   Risks, benefits, limitations, and alternatives regarding  endoscopy and colonoscopy have been reviewed with the patient.  Questions have been answered.  All parties agreeable.   Sherri Sear, MD  03/03/2018, 9:17 AM

## 2018-03-03 NOTE — Anesthesia Postprocedure Evaluation (Signed)
Anesthesia Post Note  Patient: Carly Khan  Procedure(s) Performed: COLONOSCOPY WITH PROPOFOL  ileostomy takedown (N/A ) ESOPHAGOGASTRODUODENOSCOPY (EGD) WITH PROPOFOL  Patient location during evaluation: Endoscopy Anesthesia Type: General Level of consciousness: awake and alert Pain management: pain level controlled Vital Signs Assessment: post-procedure vital signs reviewed and stable Respiratory status: spontaneous breathing, nonlabored ventilation, respiratory function stable and patient connected to nasal cannula oxygen Cardiovascular status: blood pressure returned to baseline and stable Postop Assessment: no apparent nausea or vomiting Anesthetic complications: no     Last Vitals:  Vitals:   03/03/18 1139 03/03/18 1646  BP: (!) 155/96 93/62  Pulse:  78  Resp:  (!) 24  Temp:  (!) 36.2 C  SpO2: 98% 100%    Last Pain:  Vitals:   03/03/18 1726  TempSrc:   PainSc: 0-No pain                 Martha Clan

## 2018-03-03 NOTE — OR Nursing (Signed)
Dr. Alice Reichert came into see pt, inserted a 18 fr foley catheter into her ileostomy, foley balloon instilled with 10 ml sterile saline.  250 ml of GoLytely was given via syringe into foley catheter and clamped off.  0930 Pt reported feeling nauseated and felt like she was about to pass out.  BP 67/53 P 102. Pt's coloring is pale, she diaphoretic. IV started 20 gauge catheter, IVF wide open. HOB lowered.  0940 pt reports she is not feeling like she is going to pass out, coloring is improved, no longer diaphoretic, still has nausea and upper abdominal cramping.  BP 148/79  P 78.  Pt has received 300 ml bolus of NS.

## 2018-03-03 NOTE — OR Nursing (Signed)
Dr. Marius Ditch called and asked to add an EGD to the consent.  This was done and patient and nurse intialed the addition.

## 2018-03-04 ENCOUNTER — Encounter: Payer: Self-pay | Admitting: Gastroenterology

## 2018-03-04 ENCOUNTER — Telehealth: Payer: Self-pay | Admitting: Internal Medicine

## 2018-03-04 ENCOUNTER — Telehealth: Payer: Self-pay

## 2018-03-04 LAB — HEPATITIS PANEL, ACUTE
HEP A IGM: NEGATIVE
HEP B S AG: NEGATIVE
Hep B C IgM: NEGATIVE

## 2018-03-04 NOTE — Telephone Encounter (Signed)
° °  [facility] Pre-operative Risk Assessment    Request for surgical clearance:  1. What type of surgery is being performed? Ileostomy Takedown   2. When is this surgery scheduled? 03-10-18  3. What type of clearance is required (medical clearance vs. Pharmacy clearance to hold med vs. Both)? Medical   4. Are there any medications that need to be held prior to surgery and how long? Not noted on form   5. Practice name and name of physician performing surgery? [facility] - Dr. Hampton Abbot   6. What is your office phone number [phone]   7.   What is your office fax number [phone]  8.   Anesthesia type (None, local, MAC, general) ? Not noted on form    Carly Khan 03/04/2018, 12:56 PM  _________________________________________________________________   (provider comments below)

## 2018-03-04 NOTE — Telephone Encounter (Signed)
I called and spoke with the patient to confirm if she ever started coumadin. Per the patient, she did never did start coumadin. She had a colonoscopy this past Wednesday and knew she would have to hold it for that and then she knew she had her procedure on 03/10/18 scheduled, therefore, she never started this. Will forward to Gerald Stabs, NP to review.

## 2018-03-04 NOTE — Telephone Encounter (Signed)
Cardiac Clearance faxed to Dr. Sharolyn Douglas. GI Clearance faxed to Dr.Vanga. ENT Clearance faxed to [facility] - May 15 patient had an appointment on 02/23/18 however she called and cancelled the appointment. I spoke with patient and she was encouraged to call and reschedule the ENT appointment as Dr.Piscoya is requesting clearance for her surgery that is currently scheduled 03/10/18.   I spoke with Caryl Pina in pre-admit and she would like patient to come to pre-admit 03/10/18 . I tried reaching patient but had to leave a message to return call as soon as possible to let us know what time best fits her schedule. She can choose 7:30, 10:30,12:45, 1:30 time slots.

## 2018-03-04 NOTE — Telephone Encounter (Signed)
Pre-admit appointment 03/10/18 @ 10:30. Patient verbalized understanding. Called pre-admit to

## 2018-03-05 ENCOUNTER — Inpatient Hospital Stay: Admit: 2018-03-05 | Payer: Medicare Other

## 2018-03-05 NOTE — Telephone Encounter (Signed)
Did she ever start eliquis? - this was prev recommended by pharmacist in mid April.  If she never started any form of anticoagulation than it would appear that there would be limited benefit to bridge her prior to surgery, especially if she previously no-showed for her coumadin appt to learn about bridging.  It might be simplest to plan to initiate anticoagulation post-operatively.  Beyond questions re: anticoagulation, she was previously cleared to proceed with surgery w/o further cardiac testing in clinic noted dated 4/10.  She should cont statin and  blocker throughout the perioperative period.

## 2018-03-08 LAB — SURGICAL PATHOLOGY

## 2018-03-09 NOTE — Telephone Encounter (Signed)
I recommend that Ms. Carr begin anticoagulation (whether warfarin or a NOAC) as soon as possible.  Once a surgery date has been set, we will assist with safely bridging her.  [name], MD [facility] Pager: [phone]

## 2018-03-09 NOTE — Telephone Encounter (Signed)
S/w patient. Her Ileostomy takedown has been cancelled for 03/10/18. She still needs to see the ENT doctor on 03/17/18 for clearance before the surgery. She hopes that then she will be able to have the surgery shortly after that. Patient still has not started coumadin. She does not know if starting the coumadin now is good because her surgery is still coming up soon. Advised that not being on a blood thinner puts her at risk. Advised I will seek advice and let her know. Routing to Dr End.

## 2018-03-09 NOTE — Telephone Encounter (Signed)
She agreed to start the warfarin. Stressed to her the importance of the INR checks and that she would need to some in to the Coumadin clinic tomorrow for initiation. She verbalized undersatnding. She has transportation issues. She has preadmission testing appointment at 1030am tomorrow and could come afterwards at 11:45 am. She will check with her ride to make sure she can do this and call us back if needed. Routing to Coumadin clinic.

## 2018-03-09 NOTE — Telephone Encounter (Signed)
Patient returning call.

## 2018-03-09 NOTE — Telephone Encounter (Signed)
No answer. Left message to call back.   

## 2018-03-10 ENCOUNTER — Inpatient Hospital Stay: Admission: RE | Admit: 2018-03-10 | Payer: Medicare Other | Source: Ambulatory Visit

## 2018-03-10 ENCOUNTER — Encounter: Admission: RE | Payer: Self-pay | Source: Ambulatory Visit

## 2018-03-10 ENCOUNTER — Inpatient Hospital Stay: Admission: RE | Admit: 2018-03-10 | Payer: Medicare Other | Source: Ambulatory Visit | Admitting: Surgery

## 2018-03-10 SURGERY — CLOSURE, ILEOSTOMY
Anesthesia: General | Laterality: Right

## 2018-03-10 NOTE — Telephone Encounter (Signed)
Left message for pt's daughter Narda Rutherford to call back (listed as emergency contact).

## 2018-03-10 NOTE — Telephone Encounter (Signed)
Thank you for the update.  Caydence Koenig, MD [facility] Pager: [phone]  

## 2018-03-10 NOTE — Telephone Encounter (Signed)
Has she started coumadin?  If she just started it on Monday, I will need to see her next week once she's been on it, preferably during new coumadin time slot to allow for education.

## 2018-03-10 NOTE — Telephone Encounter (Signed)
Pt's daughter called back and told scheduling that they had no intention of keeping appt w/ me today and pt has not started coumadin.  I was walking a pt by the front desk when she called, so message was relayed to me afterwards that she was advised to have pt start coumadin ASAP and schedule new pt appt next week. Pt's daughter stated that pt has had a lot going on, but she will try to have pt start coumadin tonight and see me next Wed @ 10:30. Will make Dr. Saunders Revel aware.

## 2018-03-10 NOTE — Telephone Encounter (Signed)
I have left 3 messages for pt to call back to verify if/when she started coumadin and to see if she can come in next week for INR check.  If she started coumadin Monday or has not started, it will not be in her system enough to dose based on INR reading today. I attempted again to contact her, but am only getting vm.

## 2018-03-11 ENCOUNTER — Inpatient Hospital Stay: Admission: RE | Admit: 2018-03-11 | Payer: Medicare Other | Source: Ambulatory Visit

## 2018-03-17 ENCOUNTER — Other Ambulatory Visit: Payer: Self-pay | Admitting: Unknown Physician Specialty

## 2018-03-17 DIAGNOSIS — K118 Other diseases of salivary glands: Secondary | ICD-10-CM

## 2018-03-17 NOTE — Telephone Encounter (Signed)
Pt did not show for new coumadin appt today.  Left message for her to call back and let me know if she is taking coumadin.  Stressed the importance of this medication and INR checks in this message.

## 2018-03-31 ENCOUNTER — Ambulatory Visit
Admission: RE | Admit: 2018-03-31 | Discharge: 2018-03-31 | Disposition: A | Discharge status: Home / Self Care | Payer: Medicare Other | Source: Ambulatory Visit | Attending: Unknown Physician Specialty | Admitting: Unknown Physician Specialty

## 2018-03-31 DIAGNOSIS — I700 Atherosclerosis of aorta: Secondary | ICD-10-CM | POA: Insufficient documentation

## 2018-03-31 DIAGNOSIS — K119 Disease of salivary gland, unspecified: Secondary | ICD-10-CM | POA: Diagnosis present

## 2018-03-31 DIAGNOSIS — J432 Centrilobular emphysema: Secondary | ICD-10-CM | POA: Insufficient documentation

## 2018-03-31 DIAGNOSIS — K118 Other diseases of salivary glands: Secondary | ICD-10-CM

## 2018-03-31 LAB — POCT I-STAT CREATININE: Creatinine, Ser: 1.1 mg/dL — ABNORMAL HIGH (ref 0.44–1.00)

## 2018-03-31 MED ORDER — IOPAMIDOL (ISOVUE-300) INJECTION 61%
75.0000 mL | Freq: Once | INTRAVENOUS | Status: AC | PRN
  Administered 2018-03-31: 75 mL via INTRAVENOUS

## 2018-04-12 ENCOUNTER — Telehealth: Payer: Self-pay | Admitting: Surgery

## 2018-04-12 NOTE — Telephone Encounter (Signed)
I have called patient back from a voicemail that was left. Patient states that she has been cleared from surgery from all providers requested. After looking into the chart notes, patient has not followed up with the coumadin clinic with Dr Darnelle Bos office. I have advised her that she will need to call Dr Ends office to follow up with the coumadin clinic so that once a surgery date has been set, Dr End can assist with safely bridging her.  Patient understands and will call the cardiology office to start this process. I have explained the importance of making sure to follow up on  these follow up appointments that have not been completed.   I advised patient to call our office to set up a consultation once clearance has been obtained.   I do see that Dr Tami Ribas also ordered the patient a CT neck with contrast that was completed on 03/31/18.  No clearance is in the chart at this time from ENT.

## 2018-04-14 ENCOUNTER — Ambulatory Visit (INDEPENDENT_AMBULATORY_CARE_PROVIDER_SITE_OTHER): Payer: Medicare Other

## 2018-04-14 DIAGNOSIS — I513 Intracardiac thrombosis, not elsewhere classified: Secondary | ICD-10-CM

## 2018-04-14 DIAGNOSIS — Z5181 Encounter for therapeutic drug level monitoring: Secondary | ICD-10-CM

## 2018-04-14 DIAGNOSIS — I480 Paroxysmal atrial fibrillation: Secondary | ICD-10-CM

## 2018-04-14 MED ORDER — WARFARIN SODIUM 5 MG PO TABS: 5.0000 mg | ORAL_TABLET | Freq: Every day | ORAL | 0 refills | Status: DC

## 2018-04-14 NOTE — Patient Instructions (Signed)
Please start taking coumadin 5 mg once daily. Check INR in 1 week.

## 2018-04-18 ENCOUNTER — Other Ambulatory Visit: Payer: Self-pay

## 2018-04-18 ENCOUNTER — Inpatient Hospital Stay: Payer: Medicare Other

## 2018-04-18 ENCOUNTER — Inpatient Hospital Stay
Admission: EM | Admit: 2018-04-18 | Discharge: 2018-04-19 | DRG: 395 | Disposition: A | Discharge status: Home / Self Care | Payer: Medicare Other | Attending: Surgery | Admitting: Surgery

## 2018-04-18 ENCOUNTER — Inpatient Hospital Stay (HOSPITAL_COMMUNITY)
Admit: 2018-04-18 | Discharge: 2018-04-18 | Disposition: A | Discharge status: Home / Self Care | Payer: Medicare Other | Attending: Surgery | Admitting: Surgery

## 2018-04-18 ENCOUNTER — Emergency Department: Payer: Medicare Other

## 2018-04-18 ENCOUNTER — Encounter: Payer: Self-pay | Admitting: Emergency Medicine

## 2018-04-18 DIAGNOSIS — R197 Diarrhea, unspecified: Secondary | ICD-10-CM | POA: Diagnosis not present

## 2018-04-18 DIAGNOSIS — K56609 Unspecified intestinal obstruction, unspecified as to partial versus complete obstruction: Secondary | ICD-10-CM | POA: Diagnosis present

## 2018-04-18 DIAGNOSIS — Z880 Allergy status to penicillin: Secondary | ICD-10-CM | POA: Diagnosis not present

## 2018-04-18 DIAGNOSIS — Z7901 Long term (current) use of anticoagulants: Secondary | ICD-10-CM | POA: Diagnosis not present

## 2018-04-18 DIAGNOSIS — I2510 Atherosclerotic heart disease of native coronary artery without angina pectoris: Secondary | ICD-10-CM | POA: Diagnosis present

## 2018-04-18 DIAGNOSIS — Z8249 Family history of ischemic heart disease and other diseases of the circulatory system: Secondary | ICD-10-CM

## 2018-04-18 DIAGNOSIS — K5669 Other partial intestinal obstruction: Secondary | ICD-10-CM | POA: Diagnosis not present

## 2018-04-18 DIAGNOSIS — F17210 Nicotine dependence, cigarettes, uncomplicated: Secondary | ICD-10-CM | POA: Diagnosis present

## 2018-04-18 DIAGNOSIS — Z8673 Personal history of transient ischemic attack (TIA), and cerebral infarction without residual deficits: Secondary | ICD-10-CM | POA: Diagnosis not present

## 2018-04-18 DIAGNOSIS — R112 Nausea with vomiting, unspecified: Secondary | ICD-10-CM | POA: Diagnosis not present

## 2018-04-18 DIAGNOSIS — I480 Paroxysmal atrial fibrillation: Secondary | ICD-10-CM | POA: Diagnosis present

## 2018-04-18 DIAGNOSIS — Z9119 Patient's noncompliance with other medical treatment and regimen: Secondary | ICD-10-CM

## 2018-04-18 DIAGNOSIS — Z7951 Long term (current) use of inhaled steroids: Secondary | ICD-10-CM | POA: Diagnosis not present

## 2018-04-18 DIAGNOSIS — Z888 Allergy status to other drugs, medicaments and biological substances status: Secondary | ICD-10-CM

## 2018-04-18 DIAGNOSIS — Z951 Presence of aortocoronary bypass graft: Secondary | ICD-10-CM | POA: Diagnosis not present

## 2018-04-18 DIAGNOSIS — K433 Parastomal hernia with obstruction, without gangrene: Secondary | ICD-10-CM | POA: Diagnosis present

## 2018-04-18 DIAGNOSIS — Z79899 Other long term (current) drug therapy: Secondary | ICD-10-CM | POA: Diagnosis not present

## 2018-04-18 DIAGNOSIS — Z90710 Acquired absence of both cervix and uterus: Secondary | ICD-10-CM | POA: Diagnosis not present

## 2018-04-18 DIAGNOSIS — R1084 Generalized abdominal pain: Secondary | ICD-10-CM

## 2018-04-18 DIAGNOSIS — I5030 Unspecified diastolic (congestive) heart failure: Secondary | ICD-10-CM

## 2018-04-18 DIAGNOSIS — J449 Chronic obstructive pulmonary disease, unspecified: Secondary | ICD-10-CM | POA: Diagnosis present

## 2018-04-18 DIAGNOSIS — I129 Hypertensive chronic kidney disease with stage 1 through stage 4 chronic kidney disease, or unspecified chronic kidney disease: Secondary | ICD-10-CM | POA: Diagnosis present

## 2018-04-18 DIAGNOSIS — Z85118 Personal history of other malignant neoplasm of bronchus and lung: Secondary | ICD-10-CM

## 2018-04-18 DIAGNOSIS — N183 Chronic kidney disease, stage 3 (moderate): Secondary | ICD-10-CM | POA: Diagnosis present

## 2018-04-18 LAB — CBC WITH DIFFERENTIAL/PLATELET
BASOS ABS: 0 10*3/uL (ref 0–0.1)
Basophils Relative: 0 %
Eosinophils Absolute: 0.1 10*3/uL (ref 0–0.7)
Eosinophils Relative: 1 %
HEMATOCRIT: 48 % — AB (ref 35.0–47.0)
HEMOGLOBIN: 16.9 g/dL — AB (ref 12.0–16.0)
LYMPHS PCT: 5 %
Lymphs Abs: 0.7 10*3/uL — ABNORMAL LOW (ref 1.0–3.6)
MCH: 31.8 pg (ref 26.0–34.0)
MCHC: 35.3 g/dL (ref 32.0–36.0)
MCV: 90.2 fL (ref 80.0–100.0)
MONO ABS: 1 10*3/uL — AB (ref 0.2–0.9)
Monocytes Relative: 7 %
NEUTROS ABS: 11.2 10*3/uL — AB (ref 1.4–6.5)
NEUTROS PCT: 87 %
Platelets: 258 10*3/uL (ref 150–440)
RBC: 5.32 MIL/uL — ABNORMAL HIGH (ref 3.80–5.20)
RDW: 12.5 % (ref 11.5–14.5)
WBC: 13 10*3/uL — ABNORMAL HIGH (ref 3.6–11.0)

## 2018-04-18 LAB — COMPREHENSIVE METABOLIC PANEL
ALT: 12 U/L — ABNORMAL LOW (ref 14–54)
AST: 36 U/L (ref 15–41)
Albumin: 4.3 g/dL (ref 3.5–5.0)
Alkaline Phosphatase: 100 U/L (ref 38–126)
Anion gap: 14 (ref 5–15)
BUN: 8 mg/dL (ref 6–20)
CHLORIDE: 99 mmol/L — AB (ref 101–111)
CO2: 23 mmol/L (ref 22–32)
CREATININE: 1.11 mg/dL — AB (ref 0.44–1.00)
Calcium: 9.6 mg/dL (ref 8.9–10.3)
GFR, EST AFRICAN AMERICAN: 58 mL/min — AB (ref >60)
GFR, EST NON AFRICAN AMERICAN: 50 mL/min — AB (ref >60)
Glucose, Bld: 181 mg/dL — ABNORMAL HIGH (ref 65–99)
Potassium: 3.4 mmol/L — ABNORMAL LOW (ref 3.5–5.1)
Sodium: 136 mmol/L (ref 135–145)
TOTAL PROTEIN: 8.8 g/dL — AB (ref 6.5–8.1)
Total Bilirubin: 1.2 mg/dL (ref 0.3–1.2)

## 2018-04-18 LAB — LIPASE, BLOOD: LIPASE: 36 U/L (ref 11–51)

## 2018-04-18 LAB — GASTROINTESTINAL PANEL BY PCR, STOOL (REPLACES STOOL CULTURE)
ASTROVIRUS: NOT DETECTED
Adenovirus F40/41: NOT DETECTED
CRYPTOSPORIDIUM: NOT DETECTED
CYCLOSPORA CAYETANENSIS: NOT DETECTED
Campylobacter species: NOT DETECTED
ENTAMOEBA HISTOLYTICA: NOT DETECTED
ENTEROTOXIGENIC E COLI (ETEC): NOT DETECTED
Enteroaggregative E coli (EAEC): NOT DETECTED
Enteropathogenic E coli (EPEC): NOT DETECTED
Giardia lamblia: NOT DETECTED
Norovirus GI/GII: NOT DETECTED
Plesimonas shigelloides: NOT DETECTED
Rotavirus A: NOT DETECTED
SAPOVIRUS (I, II, IV, AND V): NOT DETECTED
SHIGA LIKE TOXIN PRODUCING E COLI (STEC): NOT DETECTED
Salmonella species: NOT DETECTED
Shigella/Enteroinvasive E coli (EIEC): NOT DETECTED
VIBRIO CHOLERAE: NOT DETECTED
VIBRIO SPECIES: NOT DETECTED
YERSINIA ENTEROCOLITICA: NOT DETECTED

## 2018-04-18 LAB — PROTIME-INR
INR: 1.05
Prothrombin Time: 13.6 seconds (ref 11.4–15.2)

## 2018-04-18 LAB — C DIFFICILE QUICK SCREEN W PCR REFLEX
C DIFFICLE (CDIFF) ANTIGEN: NEGATIVE
C Diff interpretation: NOT DETECTED
C Diff toxin: NEGATIVE

## 2018-04-18 LAB — APTT: aPTT: 31 seconds (ref 24–36)

## 2018-04-18 LAB — HEPARIN LEVEL (UNFRACTIONATED)
Heparin Unfractionated: 1.07 IU/mL — ABNORMAL HIGH (ref 0.30–0.70)
Heparin Unfractionated: 0.41 IU/mL (ref 0.30–0.70)

## 2018-04-18 LAB — TROPONIN I

## 2018-04-18 MED ORDER — PHENOL 1.4 % MT LIQD
1.0000 | OROMUCOSAL | Status: DC | PRN
  Administered 2018-04-18: 1 via OROMUCOSAL
  Filled 2018-04-18: qty 177

## 2018-04-18 MED ORDER — ZIPRASIDONE MESYLATE 20 MG IM SOLR: 20.0000 mg | Freq: Once | INTRAMUSCULAR | Status: DC

## 2018-04-18 MED ORDER — FENTANYL CITRATE (PF) 100 MCG/2ML IJ SOLN
50.0000 ug | Freq: Once | INTRAMUSCULAR | Status: AC
  Administered 2018-04-18: 50 ug via INTRAVENOUS
  Filled 2018-04-18: qty 2

## 2018-04-18 MED ORDER — HEPARIN (PORCINE) IN NACL 100-0.45 UNIT/ML-% IJ SOLN
1000.0000 [IU]/h | INTRAMUSCULAR | Status: DC
  Administered 2018-04-18: 1000 [IU]/h via INTRAVENOUS
  Filled 2018-04-18: qty 250

## 2018-04-18 MED ORDER — ZIPRASIDONE MESYLATE 20 MG IM SOLR: 10.0000 mg | Freq: Once | INTRAMUSCULAR | Status: DC

## 2018-04-18 MED ORDER — KCL IN DEXTROSE-NACL 20-5-0.45 MEQ/L-%-% IV SOLN
INTRAVENOUS | Status: DC
  Administered 2018-04-18 (×2): via INTRAVENOUS
  Filled 2018-04-18 (×5): qty 1000

## 2018-04-18 MED ORDER — FAMOTIDINE IN NACL 20-0.9 MG/50ML-% IV SOLN
20.0000 mg | Freq: Two times a day (BID) | INTRAVENOUS | Status: DC
  Administered 2018-04-18 – 2018-04-19 (×3): 20 mg via INTRAVENOUS
  Filled 2018-04-18 (×3): qty 50

## 2018-04-18 MED ORDER — HEPARIN (PORCINE) IN NACL 100-0.45 UNIT/ML-% IJ SOLN
800.0000 [IU]/h | INTRAMUSCULAR | Status: DC
  Administered 2018-04-18: 800 [IU]/h via INTRAVENOUS
  Filled 2018-04-18: qty 250

## 2018-04-18 MED ORDER — IOPAMIDOL (ISOVUE-300) INJECTION 61%
30.0000 mL | Freq: Once | INTRAVENOUS | Status: AC | PRN
  Administered 2018-04-18: 30 mL via ORAL

## 2018-04-18 MED ORDER — HEPARIN BOLUS VIA INFUSION
4000.0000 [IU] | Freq: Once | INTRAVENOUS | Status: AC
  Administered 2018-04-18: 4000 [IU] via INTRAVENOUS
  Filled 2018-04-18: qty 4000

## 2018-04-18 MED ORDER — IOPAMIDOL (ISOVUE-300) INJECTION 61%
100.0000 mL | Freq: Once | INTRAVENOUS | Status: AC | PRN
  Administered 2018-04-18: 100 mL via INTRAVENOUS

## 2018-04-18 MED ORDER — ONDANSETRON HCL 4 MG/2ML IJ SOLN
4.0000 mg | Freq: Once | INTRAMUSCULAR | Status: AC
  Administered 2018-04-18: 4 mg via INTRAVENOUS
  Filled 2018-04-18: qty 2

## 2018-04-18 MED ORDER — SODIUM CHLORIDE 0.9 % IV BOLUS
1000.0000 mL | Freq: Once | INTRAVENOUS | Status: AC
  Administered 2018-04-18: 1000 mL via INTRAVENOUS

## 2018-04-18 NOTE — ED Triage Notes (Signed)
EMS pt to rm 26 from home with report of abd pain x 2 days.

## 2018-04-18 NOTE — Progress Notes (Addendum)
Patient presented with abdominal pain and N/V, discussed with Dr. Beather Arbour, chart and CT personally reviewed, consistent with chronic parastomal hernia-associated SBO. Patient in process of medical risk stratification and optimization with Dr. Hampton Abbot for reversal of diverting loop ileostomy performed elsewhere for sigmoid colectomy with protected primary anastomosis for diverticular disease with reported prior colonic fistula. Patient also subtherapeutic on warfarin prescribed for paroxysmal atrial fibrillation, diagnosed during course of workup for Left atrial appendage thrombus identified incidentally on CT chest in 04/2017, but not visualized on TEE 05/2017. Unclear if patient has been taking warfarin, previously noted to have not started due to financial constraints. Patient previously reported history of stroke, not confirmed on CT. Will check echo and and start therapeutic heparin infusion, for now, considering atrial fibrillation with history of possible embolic event. NGT being inserted per ED.  Will admit to surgical service, request medical consult. Full H&P to follow.  -- Marilynne Drivers Rosana Hoes, MD, [place]: [facility] - Partnering for exceptional care. Office: [phone]

## 2018-04-18 NOTE — Progress Notes (Signed)
ANTICOAGULATION CONSULT NOTE - Initial Consult  Pharmacy Consult for heparin drip Indication: atrial fibrillation  Allergies  Allergen Reactions  . Penicillin G Hives and Itching    Has patient had a PCN reaction causing immediate rash, facial/tongue/throat swelling, SOB or lightheadedness with hypotension: Yes Has patient had a PCN reaction causing severe rash involving mucus membranes or skin necrosis: No Has patient had a PCN reaction that required hospitalization: No Has patient had a PCN reaction occurring within the last 10 years: Yes If all of the above answers are "NO", then may proceed with Cephalosporin use.  . Succinylcholine     Patient's daughter had emergency surgery and was awake for the entire procedure, was told she should not have succ's and neither should her mom     Patient Measurements: Height: 5\' 4"  (162.6 cm) Weight: 145 lb (65.8 kg) IBW/kg (Calculated) : 54.7 Heparin Dosing Weight: 66kg  Vital Signs: Temp: 98.5 F (36.9 C) (06/16 1321) Temp Source: Oral (06/16 1321) BP: 156/91 (06/16 1322) Pulse Rate: 80 (06/16 1322)  Labs: Recent Labs    04/18/18 0145 04/18/18 0659 04/18/18 1359  HGB 16.9*  --   --   HCT 48.0*  --   --   PLT 258  --   --   APTT  --  31  --   LABPROT 13.6  --   --   INR 1.05  --   --   HEPARINUNFRC  --   --  1.07*  CREATININE 1.11*  --   --   TROPONINI <0.03  --   --     Estimated Creatinine Clearance: 45.3 mL/min (A) (by C-G formula based on SCr of 1.11 mg/dL (H)).   Medical History: Past Medical History:  Diagnosis Date  . CAD in native artery 1991   a. s/p CABG in 1991 in [place]; b. 06/2017 MV: EF 72%, no ischemia/infarct-->Low risk.  . Cancer of left lung ([place]) 2016   a. 12/2015 LUL lung resection.  . Chronic Dyspnea on exertion   . CKD (chronic kidney disease) stage 3, GFR 30-59 ml/min ([place]) 04/09/2017   a. 04/2017 ARF & Hyperkalemia req HD x 1.  . 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 Diverticulitis   . Diverticulitis   .  Dyspnea   . Essential hypertension 04/09/2017  . Family history of adverse reaction to anesthesia   . History of sebaceous cyst 02/28/2017  . History of stress test   . Intermittent vertigo 04/09/2017  . Itching 02/28/2017  . Left Atrial Appendage Thrombus    a. Incidentally noted on CT 04/2017;  b. 04/2017 Echo: EF 65-70%, Gr1 DD, no LA filling defect seen; c. 05/2017 TEE: EF 60-65%, no RA/LA thrombus; d. Later found to have PAF on Event monitor 06/2017-->initially placed on eliquis; changed to coumadin 2/2 $.  . PAF (paroxysmal atrial fibrillation) ([place])    a. Discovered 06/2017 on Event monitor as part of w/u for LAA thrombus. CHA2DS2VASc = 4-->Eliquis.  . Stroke [facility])    a. Pt reports prior h/o CVA; b. 07/2017 Head CT: Chronic atrophy and small vessel ischemic changes. No acute intracranial abnormalities.    Medications:  Warfarin as outpatient. Home dose 5 mg daily.  Assessment: INR subtherapeutic on admission  Goal of Therapy:  Heparin level 0.3-0.7 units/ml Monitor platelets by anticoagulation protocol: Yes   Plan:  Hold heparin x 1 hours and resume heparin infusion at 800 units/hr. Recheck a HL in 6 hours.   Ulice Dash D 04/18/2018,2:44 PM

## 2018-04-18 NOTE — Consult Note (Addendum)
[facility] Consultation  Carly Khan NGE:[id number] DOB: 08-18-1951 DOA: 04/18/2018 PCP: Glendon Axe, MD   Requesting physician: Tama High MD Date of consultation: 04/18/2018 Reason for consultation: Atrial fibrillation  CHIEF COMPLAINT:   Chief Complaint  Patient presents with  . Abdominal Pain    HISTORY OF PRESENT ILLNESS: Carly Khan  is a 67 y.o. female with a known history of left atrial appendage thrombus identified incidentally on CT chest in June 2018 with subtherapeutic INR on warfarin for proximal atrial fibrillation, coronary artery disease, cancer left lung, chronic kidney disease stage III, hypertension currently under surgical service for bowel obstruction.  Patient has a parastomal hernia associated small bowel obstruction.  Currently has NG tube.  Decreased abdominal pain.  No complaints of any chest pain, shortness of breath.  No fever and chills.  On anticoagulation with IV heparin drip.  PAST MEDICAL HISTORY:   Past Medical History:  Diagnosis Date  . CAD in native artery 1991   a. s/p CABG in 1991 in [place]; b. 06/2017 MV: EF 72%, no ischemia/infarct-->Low risk.  . Cancer of left lung ([facility]) 2016   a. 12/2015 LUL lung resection.  . Chronic Dyspnea on exertion   . CKD (chronic kidney disease) stage 3, GFR 30-59 ml/min ([facility]) 04/09/2017   a. 04/2017 ARF & Hyperkalemia req HD x 1.  . 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 Diverticulitis   . Diverticulitis   . Dyspnea   . Essential hypertension 04/09/2017  . Family history of adverse reaction to anesthesia   . History of sebaceous cyst 02/28/2017  . History of stress test   . Intermittent vertigo 04/09/2017  . Itching 02/28/2017  . Left Atrial Appendage Thrombus    a. Incidentally noted on CT 04/2017;  b. 04/2017 Echo: EF 65-70%, Gr1 DD, no LA filling defect seen; c. 05/2017 TEE: EF 60-65%, no RA/LA thrombus; d. Later found to have PAF on Event monitor 06/2017-->initially placed on eliquis; changed to coumadin 2/2 $.  .  PAF (paroxysmal atrial fibrillation) ([facility])    a. Discovered 06/2017 on Event monitor as part of w/u for LAA thrombus. CHA2DS2VASc = 4-->Eliquis.  . Stroke [facility])    a. Pt reports prior h/o CVA; b. 07/2017 Head CT: Chronic atrophy and small vessel ischemic changes. No acute intracranial abnormalities.    PAST SURGICAL HISTORY:  Past Surgical History:  Procedure Laterality Date  . ABDOMINAL HYSTERECTOMY    . cardiac bypass  1990  . carpel tunnel    . CHOLECYSTECTOMY    . COLON SURGERY    . COLONOSCOPY    . COLONOSCOPY WITH PROPOFOL N/A 03/03/2018   Procedure: COLONOSCOPY WITH PROPOFOL  ileostomy takedown;  Surgeon: Lin Landsman, MD;  Location: [facility];  Service: Gastroenterology;  Laterality: N/A;  . CORONARY ARTERY BYPASS GRAFT  1990's   x 4  . CT guided needle placement    . ESOPHAGOGASTRODUODENOSCOPY (EGD) WITH PROPOFOL  03/03/2018   Procedure: ESOPHAGOGASTRODUODENOSCOPY (EGD) WITH PROPOFOL;  Surgeon: Lin Landsman, MD;  Location: [facility];  Service: Gastroenterology;;  . EXPLORATORY LAPAROTOMY    . GALLBLADDER SURGERY    . loop ileostomy    . SIGMOID RESECTION / RECTOPEXY    . TEE WITHOUT CARDIOVERSION N/A 05/04/2017   Procedure: TRANSESOPHAGEAL ECHOCARDIOGRAM (TEE);  Surgeon: Wellington Hampshire, MD;  Location: [facility] ORS;  Service: Cardiovascular;  Laterality: N/A;  . THORACOSCOPY    . vaginectomy      SOCIAL HISTORY:  Social History   Tobacco Use  .  Smoking status: Current Every Day Smoker    Packs/day: 0.50    Years: 40.00    Pack years: 20.00  . Smokeless tobacco: Never Used  . Tobacco comment: denied smoking info  Substance Use Topics  . Alcohol use: No    FAMILY HISTORY:  Family History  Problem Relation Age of Onset  . Cancer Mother   . Stomach cancer Mother   . Heart disease Father   . Hypertension Father   . Parkinson's disease Sister   . Cervical cancer Sister     DRUG ALLERGIES:  Allergies  Allergen Reactions  . Penicillin G Hives  and Itching    Has patient had a PCN reaction causing immediate rash, facial/tongue/throat swelling, SOB or lightheadedness with hypotension: Yes Has patient had a PCN reaction causing severe rash involving mucus membranes or skin necrosis: No Has patient had a PCN reaction that required hospitalization: No Has patient had a PCN reaction occurring within the last 10 years: Yes If all of the above answers are "NO", then may proceed with Cephalosporin use.  . Succinylcholine     Patient's daughter had emergency surgery and was awake for the entire procedure, was told she should not have succ's and neither should her mom     REVIEW OF SYSTEMS:   CONSTITUTIONAL: No fever, fatigue or weakness.  EYES: No blurred or double vision.  EARS, NOSE, AND THROAT: No tinnitus or ear pain.  RESPIRATORY: No cough, shortness of breath, wheezing or hemoptysis.  CARDIOVASCULAR: No chest pain, orthopnea, edema.  GASTROINTESTINAL: Decreased nausea, vomiting, no diarrhea  decreased abdominal pain.  GENITOURINARY: No dysuria, hematuria.  ENDOCRINE: No polyuria, nocturia,  HEMATOLOGY: No anemia, easy bruising or bleeding SKIN: No rash or lesion. MUSCULOSKELETAL: No joint pain or arthritis.   NEUROLOGIC: No tingling, numbness, weakness.  PSYCHIATRY: No anxiety or depression.   MEDICATIONS AT HOME:  Prior to Admission medications   Medication Sig Start Date End Date Taking? Authorizing Provider  acetaminophen (TYLENOL) 500 MG tablet Take 500 mg by mouth every 6 (six) hours as needed (for pain).    Yes [provider]  BREO ELLIPTA 100-25 MCG/INH AEPB Inhale 1 puff into the lungs 2 (two) times daily.   Yes [provider]  metoprolol succinate (TOPROL-XL) 25 MG 24 hr tablet Take 1 tablet (25 mg total) by mouth daily. 01/04/18 01/04/19 Yes Bettey Costa, MD  omeprazole (PRILOSEC) 20 MG capsule Take 1 capsule (20 mg total) by mouth 2 (two) times daily before a meal. 03/03/18 08/30/18 Yes Vanga, Tally Due, MD  pravastatin (PRAVACHOL) 10 MG tablet Take 10 mg by mouth daily.    Yes [provider]  warfarin (COUMADIN) 5 MG tablet Take 1 tablet (5 mg total) by mouth daily at 6 PM. Take 1 tablet daily 04/14/18  Yes End, Harrell Gave, MD  albuterol (PROVENTIL HFA;VENTOLIN HFA) 108 (90 Base) MCG/ACT inhaler Inhale 2 puffs into the lungs every 6 (six) hours as needed. 04/13/17 04/13/18  [provider]  ondansetron (ZOFRAN ODT) 4 MG disintegrating tablet Take 1 tablet (4 mg total) by mouth every 8 (eight) hours as needed for nausea or vomiting. Patient not taking: Reported on 02/26/2018 12/26/17   Earleen Newport, MD      PHYSICAL EXAMINATION:   VITAL SIGNS: Blood pressure (!) 159/72, pulse 98, temperature 98.5 F (36.9 C), temperature source Oral, resp. rate 16, height 5\' 4"  (1.626 m), weight 65.8 kg (145 lb), SpO2 98 %.  GENERAL:  67 y.o.-year-old patient lying  in the bed with no acute distress.  EYES: Pupils equal, round, reactive to light and accommodation. No scleral icterus. Extraocular muscles intact.  HEENT: Head atraumatic, normocephalic. Oropharynx and nasopharynx clear.  Has NGT NECK:  Supple, no jugular venous distention. No thyroid enlargement, no tenderness.  LUNGS: Normal breath sounds bilaterally, no wheezing, rales,rhonchi or crepitation. No use of accessory muscles of respiration.  CARDIOVASCULAR: S1, S2 normal. No murmurs, rubs, or gallops.  ABDOMEN: Soft, mild tenderness around umbilicus, nondistended. Bowel sounds decreased. No organomegaly or mass.  Has Ileostomy EXTREMITIES: No pedal edema, cyanosis, or clubbing.  NEUROLOGIC: Cranial nerves II through XII are intact. Muscle strength 5/5 in all extremities. Sensation intact. Gait not checked.  PSYCHIATRIC: The patient is alert and oriented x 3.  SKIN: No obvious rash, lesion, or ulcer.   LABORATORY PANEL:   CBC Recent Labs  Lab 04/18/18 0145  WBC 13.0*  HGB 16.9*  HCT 48.0*  PLT 258  MCV 90.2   MCH 31.8  MCHC 35.3  RDW 12.5  LYMPHSABS 0.7*  MONOABS 1.0*  EOSABS 0.1  BASOSABS 0.0   ------------------------------------------------------------------------------------------------------------------  Chemistries  Recent Labs  Lab 04/18/18 0145  NA 136  K 3.4*  CL 99*  CO2 23  GLUCOSE 181*  BUN 8  CREATININE 1.11*  CALCIUM 9.6  AST 36  ALT 12*  ALKPHOS 100  BILITOT 1.2   ------------------------------------------------------------------------------------------------------------------ estimated creatinine clearance is 45.3 mL/min (A) (by C-G formula based on SCr of 1.11 mg/dL (H)). ------------------------------------------------------------------------------------------------------------------ No results for input(s): TSH, T4TOTAL, T3FREE, THYROIDAB in the last 72 hours.  Invalid input(s): FREET3   Coagulation profile Recent Labs  Lab 04/18/18 0145  INR 1.05   ------------------------------------------------------------------------------------------------------------------- No results for input(s): DDIMER in the last 72 hours. -------------------------------------------------------------------------------------------------------------------  Cardiac Enzymes Recent Labs  Lab 04/18/18 0145  TROPONINI <0.03   ------------------------------------------------------------------------------------------------------------------ Invalid input(s): POCBNP  ---------------------------------------------------------------------------------------------------------------  Urinalysis    Component Value Date/Time   COLORURINE AMBER (A) 12/28/2017 2123   APPEARANCEUR CLOUDY (A) 12/28/2017 2123   LABSPEC 1.021 12/28/2017 2123   PHURINE 5.0 12/28/2017 2123   GLUCOSEU NEGATIVE 12/28/2017 2123   HGBUR MODERATE (A) 12/28/2017 2123   BILIRUBINUR NEGATIVE 12/28/2017 2123   [place] NEGATIVE 12/28/2017 2123   PROTEINUR 30 (A) 12/28/2017 2123   NITRITE NEGATIVE 12/28/2017  2123   LEUKOCYTESUR LARGE (A) 12/28/2017 2123     RADIOLOGY: Dg Abdomen 1 View  Result Date: 04/18/2018 CLINICAL DATA:  NG tube placement. EXAM: ABDOMEN - 1 VIEW COMPARISON:  CT earlier this day. FINDINGS: Tip and side port of the enteric tube below the diaphragm in the stomach. Dilated contrast filled small bowel in the right abdomen. Excreted IV contrast in the renal collecting systems and urinary bladder. IMPRESSION: Tip and side-port of the enteric tube below the diaphragm in the stomach Electronically Signed   By: Jeb Levering M.D.   On: 04/18/2018 06:08   Ct Abdomen Pelvis W Contrast  Result Date: 04/18/2018 CLINICAL DATA:  Nausea, vomiting and diarrhea. Patient with right lower quadrant colostomy. Recently passed formed bowel movement through the rectum which is not happened in years. EXAM: CT ABDOMEN AND PELVIS WITH CONTRAST TECHNIQUE: Multidetector CT imaging of the abdomen and pelvis was performed using the standard protocol following bolus administration of intravenous contrast. CONTRAST:  189mL ISOVUE-300 IOPAMIDOL (ISOVUE-300) INJECTION 61% COMPARISON:  Noncontrast CT 12/28/2017 FINDINGS: Lower chest: The lung bases are clear. Hepatobiliary: Decreased hepatic density suggesting steatosis. Fine nodular hepatic contours again seen. Postcholecystectomy with stable biliary prominence, common bile  duct measures 13 mm at the porta hepatis with normal tapering distally. Pancreas: No ductal dilatation or inflammation. Spleen: Normal in size without focal abnormality. Adrenals/Urinary Tract: No adrenal nodule. No hydronephrosis or perinephric edema. Homogeneous renal enhancement with symmetric excretion on delayed phase imaging. Again seen cyst in the anterior mid right kidney, with additional tiny cortical hypodensities. Cortical scarring in the left upper pole. Urinary bladder is physiologically distended without wall thickening. Stomach/Bowel: Stomach is distended with enteric contrast.  Findings suspicious for recurrent small bowel obstruction with dilated fluid-filled small bowel with transition point in the right lower quadrant peristomal hernia. More distal small bowel is decompressed. Mild stranding in the peristomal hernia with trace free fluid. The appendix is visualized and is normal. The colon is decompressed, which answers colon extending into anterior abdominal wall laxity. Unchanged enteric sutures in the sigmoid colon. The previous enteric contrast throughout the colon on prior exam has been evacuated, including the previous barium in the colon. No evidence of colo enteric fistula. Small mural lipoma in the ascending colon measuring 15 mm Vascular/Lymphatic: Aorto bi-iliac atherosclerosis. Small retroperitoneal nodes not enlarged by size criteria. Portal vein and mesenteric vessels are patent. No portal venous or mesenteric gas. Reproductive: Status post hysterectomy. No adnexal masses. Other: Laxity of the anterior abdominal wall musculature with stomach and transverse colon extending into the lax defect. No ascites or free air in the abdomen or pelvis. No intra-abdominal abscess. Musculoskeletal: Bones are under mineralized. There are no acute or suspicious osseous abnormalities. Degenerative change in the lumbar spine. IMPRESSION: 1. Findings suspicious for recurrent small bowel obstruction with transition point in the right lower quadrant parastomal hernia. 2. Evacuation of colonic contrast since February 2019 CT. No evidence of colo enteric fistula. 3. Nodular hepatic contours suspicious for cirrhosis. Hepatic steatosis. 4.  Aortic Atherosclerosis (ICD10-I70.0). Electronically Signed   By: Jeb Levering M.D.   On: 04/18/2018 04:13    EKG: Orders placed or performed during the hospital encounter of 04/18/18  . EKG 12-Lead  . EKG 12-Lead    IMPRESSION AND PLAN:  67 year old female patient with history of ileostomy status post diverticulitis with fistula, coronary artery  disease, carcinoma left lung, chronic kidney disease stage III, hypertension, left atrial appendage thrombus, paroxysmal atrial fibrillation currently under surgical service for bowel obstruction.  -Small bowel obstruction Surgery follow-up Abdominal x-rays N.p.o. and NG tube to continue IV fluids with electrolyte supplementation  -History of left atrial appendage thrombus with paroxysmal atrial fibrillation Has history of multiple CVA Follow-up echocardiogram Continue IV heparin drip for anticoagulation At discharge needs oral warfarin  -Chronic kidney disease stage III Monitor renal function  -Stool for C. difficile toxin negative Discontinue enteric precautions  All the records are reviewed and case discussed with ED provider. Management plans discussed with the patient, family and they are in agreement.  CODE STATUS:Full code    Code Status Orders  (From admission, onward)        Start     Ordered   04/18/18 0527  Full code  Continuous     04/18/18 0537    Code Status History    Date Active Date Inactive Code Status Order ID Comments User Context   12/29/2017 0245 01/04/2018 1803 Full Code [id number]  Lance Coon, MD ED   04/29/2017 2207 05/04/2017 1954 Full Code [id number]  Nicholes Mango, MD Inpatient       TOTAL TIME TAKING CARE OF THIS PATIENT: 52 minutes.    Saundra Shelling M.D on 04/18/2018 at  10:42 AM  Between 7am to 6pm - Pager - [phone]  After 6pm go to [email or web address] - password [email or web address] [facility]  [phone]  CC: Primary care physician; Glendon Axe, MD

## 2018-04-18 NOTE — Progress Notes (Signed)
*  PRELIMINARY RESULTS* Echocardiogram 2D Echocardiogram has been performed.  Carly Khan 04/18/2018, 2:28 PM

## 2018-04-18 NOTE — H&P (Signed)
SURGICAL HISTORY & PHYSICAL (cpt [phone])  HISTORY OF PRESENT ILLNESS (HPI):  67 y.o. female presented to [facility] ED overnight for abdominal pain with N/V. CT was performed, NG tube was inserted, and admission was requested for SBO. Patient reports cramping abdominal pain with increased looser and lighter-colored output from her diverting loop ileostomy created in [place], [place] following partial sigmoid colectomy with primary anastomosis for diverticular disease. Patient was previously admitted for SBO attributed at least in part to parastomal hernia and has since been gradually been pursuing medical risk stratification and optimization for planned reversal/takedown of diverting loop ileostomy with Dr. Hampton Abbot. Since patient was admitted to med-surg, NG tube has yet to be connected to suction, and patient reports improved abdominal pain and nausea without abdominal distention and has emptied her ileostomy bag twice. She otherwise denies fever/chills, CP, or SOB. Of note, though Eliquis and then Coumadin were prescribed several months ago, patient reports she just filled warfarin prescription last week and was scheduled for INR blood draw this coming Wednesday.  PAST MEDICAL HISTORY (PMH):  Past Medical History:  Diagnosis Date  . CAD in native artery 1991   a. s/p CABG in 1991 in [place]; b. 06/2017 MV: EF 72%, no ischemia/infarct-->Low risk.  . Cancer of left lung ([place]) 2016   a. 12/2015 LUL lung resection.  . Chronic Dyspnea on exertion   . CKD (chronic kidney disease) stage 3, GFR 30-59 ml/min ([place]) 04/09/2017   a. 04/2017 ARF & Hyperkalemia req HD x 1.  . 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 Diverticulitis   . Diverticulitis   . Dyspnea   . Essential hypertension 04/09/2017  . Family history of adverse reaction to anesthesia   . History of sebaceous cyst 02/28/2017  . History of stress test   . Intermittent vertigo 04/09/2017  . Itching 02/28/2017  . Left Atrial Appendage Thrombus    a. Incidentally noted on CT  04/2017;  b. 04/2017 Echo: EF 65-70%, Gr1 DD, no LA filling defect seen; c. 05/2017 TEE: EF 60-65%, no RA/LA thrombus; d. Later found to have PAF on Event monitor 06/2017-->initially placed on eliquis; changed to coumadin 2/2 $.  . PAF (paroxysmal atrial fibrillation) ([place])    a. Discovered 06/2017 on Event monitor as part of w/u for LAA thrombus. CHA2DS2VASc = 4-->Eliquis.  . Stroke [facility])    a. Pt reports prior h/o CVA; b. 07/2017 Head CT: Chronic atrophy and small vessel ischemic changes. No acute intracranial abnormalities.    Reviewed. Otherwise negative.   PAST SURGICAL HISTORY ([place]):  Past Surgical History:  Procedure Laterality Date  . ABDOMINAL HYSTERECTOMY    . cardiac bypass  1990  . carpel tunnel    . CHOLECYSTECTOMY    . COLON SURGERY    . COLONOSCOPY    . COLONOSCOPY WITH PROPOFOL N/A 03/03/2018   Procedure: COLONOSCOPY WITH PROPOFOL  ileostomy takedown;  Surgeon: Lin Landsman, MD;  Location: [place];  Service: Gastroenterology;  Laterality: N/A;  . CORONARY ARTERY BYPASS GRAFT  1990's   x 4  . CT guided needle placement    . ESOPHAGOGASTRODUODENOSCOPY (EGD) WITH PROPOFOL  03/03/2018   Procedure: ESOPHAGOGASTRODUODENOSCOPY (EGD) WITH PROPOFOL;  Surgeon: Lin Landsman, MD;  Location: [facility];  Service: Gastroenterology;;  . EXPLORATORY LAPAROTOMY    . GALLBLADDER SURGERY    . loop ileostomy    . SIGMOID RESECTION / RECTOPEXY    . TEE WITHOUT CARDIOVERSION N/A 05/04/2017   Procedure: TRANSESOPHAGEAL ECHOCARDIOGRAM (TEE);  Surgeon: Wellington Hampshire,  MD;  Location: [facility] ORS;  Service: Cardiovascular;  Laterality: N/A;  . THORACOSCOPY    . vaginectomy      Reviewed. Otherwise negative.   MEDICATIONS:  Prior to Admission medications   Medication Sig Start Date End Date Taking? Authorizing Provider  acetaminophen (TYLENOL) 500 MG tablet Take 500 mg by mouth every 6 (six) hours as needed (for pain).    Yes [provider]  BREO ELLIPTA 100-25  MCG/INH AEPB Inhale 1 puff into the lungs 2 (two) times daily.   Yes [provider]  metoprolol succinate (TOPROL-XL) 25 MG 24 hr tablet Take 1 tablet (25 mg total) by mouth daily. 01/04/18 01/04/19 Yes Bettey Costa, MD  omeprazole (PRILOSEC) 20 MG capsule Take 1 capsule (20 mg total) by mouth 2 (two) times daily before a meal. 03/03/18 08/30/18 Yes Vanga, Tally Due, MD  pravastatin (PRAVACHOL) 10 MG tablet Take 10 mg by mouth daily.    Yes [provider]  warfarin (COUMADIN) 5 MG tablet Take 1 tablet (5 mg total) by mouth daily at 6 PM. Take 1 tablet daily 04/14/18  Yes End, Harrell Gave, MD  albuterol (PROVENTIL HFA;VENTOLIN HFA) 108 (90 Base) MCG/ACT inhaler Inhale 2 puffs into the lungs every 6 (six) hours as needed. 04/13/17 04/13/18  [provider]  ondansetron (ZOFRAN ODT) 4 MG disintegrating tablet Take 1 tablet (4 mg total) by mouth every 8 (eight) hours as needed for nausea or vomiting. Patient not taking: Reported on 02/26/2018 12/26/17   Earleen Newport, MD     ALLERGIES:  Allergies  Allergen Reactions  . Penicillin G Hives and Itching    Has patient had a PCN reaction causing immediate rash, facial/tongue/throat swelling, SOB or lightheadedness with hypotension: Yes Has patient had a PCN reaction causing severe rash involving mucus membranes or skin necrosis: No Has patient had a PCN reaction that required hospitalization: No Has patient had a PCN reaction occurring within the last 10 years: Yes If all of the above answers are "NO", then may proceed with Cephalosporin use.  . Succinylcholine     Patient's daughter had emergency surgery and was awake for the entire procedure, was told she should not have succ's and neither should her mom      SOCIAL HISTORY:  Social History   Socioeconomic History  . Marital status: Divorced    Spouse name: Not on file  . Number of children: Not on file  . Years of education: Not on file  . Highest education level:  Not on file  Occupational History  . Not on file  Social Needs  . Financial resource strain: Not on file  . Food insecurity:    Worry: Not on file    Inability: Not on file  . Transportation needs:    Medical: Not on file    Non-medical: Not on file  Tobacco Use  . Smoking status: Current Every Day Smoker    Packs/day: 0.50    Years: 40.00    Pack years: 20.00  . Smokeless tobacco: Never Used  . Tobacco comment: denied smoking info  Substance and Sexual Activity  . Alcohol use: No  . Drug use: No  . Sexual activity: Never  Lifestyle  . Physical activity:    Days per week: Not on file    Minutes per session: Not on file  . Stress: Not on file  Relationships  . Social connections:    Talks on phone: Not on file    Gets together: Not  on file    Attends religious service: Not on file    Active member of club or organization: Not on file    Attends meetings of clubs or organizations: Not on file    Relationship status: Not on file  . Intimate partner violence:    Fear of current or ex partner: Not on file    Emotionally abused: Not on file    Physically abused: Not on file    Forced sexual activity: Not on file  Other Topics Concern  . Not on file  Social History Narrative  . Not on file    The patient currently resides (home / rehab facility / nursing home): Home The patient normally is (ambulatory / bedbound): Ambulatory  FAMILY HISTORY:  Family History  Problem Relation Age of Onset  . Cancer Mother   . Stomach cancer Mother   . Heart disease Father   . Hypertension Father   . Parkinson's disease Sister   . Cervical cancer Sister     Otherwise negative.   REVIEW OF SYSTEMS:  Constitutional: denies any other weight loss, fever, chills, or sweats  Eyes: denies any other vision changes, history of eye injury  ENT: denies sore throat, hearing problems  Respiratory: denies shortness of breath, wheezing  Cardiovascular: denies chest pain, palpitations   Gastrointestinal: abdominal pain, N/V, and bowel function as per HPI  Genitourinary: denies burning with urination or urinary frequency Musculoskeletal: denies any other joint pains or cramps  Skin: Denies any other rashes or skin discolorations  Neurological: denies any other headache, dizziness, weakness  Psychiatric: denies any other depression, anxiety   All other review of systems were otherwise negative.  VITAL SIGNS:  Temp:  [98.3 F (36.8 C)-98.5 F (36.9 C)] 98.5 F (36.9 C) (06/16 0641) Pulse Rate:  [82-119] 98 (06/16 0641) Resp:  [14-24] 16 (06/16 0641) BP: (98-159)/(72-109) 159/72 (06/16 0641) SpO2:  [93 %-99 %] 98 % (06/16 0641) Weight:  [145 lb (65.8 kg)] 145 lb (65.8 kg) (06/16 0124)     Height: 5\' 4"  (162.6 cm) Weight: 145 lb (65.8 kg) BMI (Calculated): 24.88   INTAKE/OUTPUT:  This shift: Total I/O In: 40 [I.V.:40] Out: 650 [Urine:200; Stool:450]  Last 2 shifts: @IOLAST2SHIFTS @  PHYSICAL EXAM:  Constitutional:  -- Normal body habitus  -- Awake, alert, and oriented x3, no apparent distress Eyes:  -- Pupils equally round and reactive to light  -- No scleral icterus, B/L no occular discharge Ear, nose, throat: -- Neck is FROM WNL -- No jugular venous distension  Pulmonary:  -- No wheezes or rhales -- Equal breath sounds bilaterally -- Breathing non-labored at rest Cardiovascular:  -- S1, S2 present  -- No pericardial rubs  Gastrointestinal:  -- Abdomen currently soft, nontender, and non-distended with no guarding or rebound tenderness -- Viable functional loop ileostomy with ileostomy bag filled with gas and yellow liquid fecal material -- No abdominal masses appreciated, pulsatile or otherwise  Musculoskeletal and Integumentary:  -- Wounds or skin discoloration: None appreciated except as described above (GI) and post-surgical healed abdominal wounds -- Extremities: B/L UE and LE FROM, hands and feet warm, no edema  Neurologic:  -- Motor function:  Intact and symmetric -- Sensation: Intact and symmetric Psychiatric:  -- Mood and affect WNL  Labs:  CBC Latest Ref Rng & Units 04/18/2018 01/01/2018 12/29/2017  WBC 3.6 - 11.0 K/uL 13.0(H) 5.4 6.5  Hemoglobin 12.0 - 16.0 g/dL 16.9(H) 13.2 15.0  Hematocrit 35.0 - 47.0 % 48.0(H) 38.3 43.5  Platelets 150 - 440 K/uL 258 196 330   CMP Latest Ref Rng & Units 04/18/2018 03/31/2018 01/02/2018  Glucose 65 - 99 mg/dL 181(H) - 98  BUN 6 - 20 mg/dL 8 - 23(H)  Creatinine 0.44 - 1.00 mg/dL 1.11(H) 1.10(H) 0.91  Sodium 135 - 145 mmol/L 136 - 141  Potassium 3.5 - 5.1 mmol/L 3.4(L) - 3.5  Chloride 101 - 111 mmol/L 99(L) - 103  CO2 22 - 32 mmol/L 23 - 25  Calcium 8.9 - 10.3 mg/dL 9.6 - 8.6(L)  Total Protein 6.5 - 8.1 g/dL 8.8(H) - -  Total Bilirubin 0.3 - 1.2 mg/dL 1.2 - -  Alkaline Phos 38 - 126 U/L 100 - -  AST 15 - 41 U/L 36 - -  ALT 14 - 54 U/L 12(L) - -   Imaging studies:  CT Abdomen and Pelvis with Contrast (04/18/2018) - personally reviewed and discussed with patient and her RN Stomach is distended with enteric contrast. Findings suspicious for recurrent small bowel obstruction with dilated fluid-filled small bowel with transition point in the right lower quadrant peristomal hernia. More distal small bowel is decompressed. Mild stranding in the peristomal hernia with trace free fluid. The appendix is visualized and is normal. The colon is decompressed, which answers colon extending into anterior abdominal wall laxity. Unchanged enteric sutures in the sigmoid colon. The previous enteric contrast throughout the colon on prior exam has been evacuated, including the previous barium in the colon. No evidence of colo- enteric fistula. Small mural lipoma in the ascending colon. Nodular hepatic contours suspicious for cirrhosis.   Assessment/Plan: (ICD-10's: K25.690) 67 y.o. 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    - connect NG tube to suction  - if NG tube output low and ongoing ileostomy function without abdominal pain or distention, remove NG tube  - medical management of comorbidities as per medical consultation (appreciated)  - anticipate bridge therapeutic heparin infusion to Coumadin  - outpatient surgical follow-up when discharged  - smoking cessation strongly encouraged  - ambulation encouraged  All of the above findings and recommendations were discussed with the patient and her RN, and all of patient's questions were answered to her expressed satisfaction.  -- Marilynne Drivers Rosana Hoes, MD, [place]: [facility] - Partnering for exceptional care. Office: [phone]

## 2018-04-18 NOTE — ED Provider Notes (Signed)
[facility] Emergency Department Provider Note   ____________________________________________   First MD Initiated Contact with Patient 04/18/18 [phone]     (approximate)  I have reviewed the triage vital signs and the nursing notes.   HISTORY  Chief Complaint Abdominal Pain    HPI Carly Khan is a 67 y.o. female who presents to the ED from home via EMS with a chief complaint of abdominal pain.  Patient has a history of ileostomy status post diverticulitis with fistula.  Reports generalized abdominal pain since yesterday associated with nausea/vomiting/diarrhea.  States yesterday morning she had a bowel movement through her rectum which is very unusual for her.  Over the course of the day she had to empty her bag multiple times secondary to loose stool and gas.  Denies associated fever, chills, chest pain, shortness of breath, dysuria.  Denies recent travel, trauma or antibiotic use.  Of note, patient has been undergoing medical clearance in preparation for ileostomy takedown.  She is on warfarin for atrial thrombus.   Past Medical History:  Diagnosis Date  . CAD in native artery 1991   a. s/p CABG in 1991 in [place]; b. 06/2017 MV: EF 72%, no ischemia/infarct-->Low risk.  . Cancer of left lung ([place]) 2016   a. 12/2015 LUL lung resection.  . Chronic Dyspnea on exertion   . CKD (chronic kidney disease) stage 3, GFR 30-59 ml/min ([place]) 04/09/2017   a. 04/2017 ARF & Hyperkalemia req HD x 1.  . 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 Diverticulitis   . Diverticulitis   . Dyspnea   . Essential hypertension 04/09/2017  . Family history of adverse reaction to anesthesia   . History of sebaceous cyst 02/28/2017  . History of stress test   . Intermittent vertigo 04/09/2017  . Itching 02/28/2017  . Left Atrial Appendage Thrombus    a. Incidentally noted on CT 04/2017;  b. 04/2017 Echo: EF 65-70%, Gr1 DD, no LA filling defect seen; c. 05/2017 TEE: EF 60-65%, no RA/LA thrombus; d. Later found  to have PAF on Event monitor 06/2017-->initially placed on eliquis; changed to coumadin 2/2 $.  . PAF (paroxysmal atrial fibrillation) ([place])    a. Discovered 06/2017 on Event monitor as part of w/u for LAA thrombus. CHA2DS2VASc = 4-->Eliquis.  . Stroke [facility])    a. Pt reports prior h/o CVA; b. 07/2017 Head CT: Chronic atrophy and small vessel ischemic changes. No acute intracranial abnormalities.    Patient Active Problem List   Diagnosis Date Noted  . PUD (peptic ulcer disease)   . Chronic gastric erosion   . Colon cancer screening   . SBO (small bowel obstruction) ([place]) 12/29/2017  . Intractable nausea and vomiting 12/28/2017  . UTI (urinary tract infection) 12/28/2017  . Chronic obstructive pulmonary disease ([place]) 11/25/2017  . Current smoker 11/25/2017  . Personal history of lung cancer 11/25/2017  . Pre-diabetes 08/30/2017  . Pure hypercholesterolemia 08/30/2017  . Hepatic cirrhosis ([place]) 07/01/2017  . Shortness of breath 06/17/2017  . Paroxysmal atrial fibrillation ([place]) 06/17/2017  . Thrombus of left atrial appendage without antecedent myocardial infarction 06/17/2017  . Incisional hernia, without obstruction or gangrene 05/27/2017  . 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 Thrombus in heart chamber 05/17/2017  . Hyperkalemia   . Acute renal failure ([place])   . CKD (chronic kidney disease) stage 3, GFR 30-59 ml/min ([facility]) 04/09/2017  . Essential hypertension 04/09/2017  . Intermittent vertigo 04/09/2017  . CAD in native artery 02/28/2017  .  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 History of sebaceous cyst 02/28/2017  . Itching 02/28/2017  . 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 Lung nodule 06/25/2016  . Adenocarcinoma of left lung ([place]) 06/19/2016  . Primary cancer of left upper lobe of lung ([place]) 06/19/2016  . AKI (acute kidney injury) ([place]) 12/29/2015  . 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 ABDOMINAL HYSTERECTOMY    . cardiac bypass  1990  . carpel tunnel    . CHOLECYSTECTOMY    . COLON SURGERY    . COLONOSCOPY    . COLONOSCOPY WITH PROPOFOL N/A 03/03/2018   Procedure: COLONOSCOPY WITH PROPOFOL  ileostomy takedown;  Surgeon: Lin Landsman, MD;  Location: [place];  Service: Gastroenterology;  Laterality: N/A;  . CORONARY ARTERY BYPASS GRAFT  1990's   x 4  . CT guided needle placement    . ESOPHAGOGASTRODUODENOSCOPY (EGD) WITH PROPOFOL  03/03/2018   Procedure: ESOPHAGOGASTRODUODENOSCOPY (EGD) WITH PROPOFOL;  Surgeon: Lin Landsman, MD;  Location: [facility];  Service: Gastroenterology;;  . EXPLORATORY LAPAROTOMY    . GALLBLADDER SURGERY    . loop ileostomy    . SIGMOID RESECTION / RECTOPEXY    . TEE WITHOUT CARDIOVERSION N/A 05/04/2017   Procedure: TRANSESOPHAGEAL ECHOCARDIOGRAM (TEE);  Surgeon: Wellington Hampshire, MD;  Location: [facility] ORS;  Service: Cardiovascular;  Laterality: N/A;  . THORACOSCOPY    . vaginectomy      Prior to Admission medications   Medication Sig Start Date End Date Taking? Authorizing Provider  acetaminophen (TYLENOL) 500 MG tablet Take 500 mg by mouth every 6 (six) hours as needed (for pain).    Yes [provider]  BREO ELLIPTA 100-25 MCG/INH AEPB Inhale 1 puff into the lungs 2 (two) times daily.   Yes [provider]  metoprolol succinate (TOPROL-XL) 25 MG 24 hr tablet Take 1 tablet (25 mg total) by mouth daily. 01/04/18 01/04/19 Yes Bettey Costa, MD  omeprazole (PRILOSEC) 20 MG capsule Take 1 capsule (20 mg total) by mouth 2 (two) times daily before a meal. 03/03/18 08/30/18 Yes Vanga, Tally Due, MD  pravastatin (PRAVACHOL) 10 MG tablet Take 10 mg by mouth daily.    Yes [provider]  warfarin (COUMADIN) 5 MG tablet Take 1 tablet (5 mg total) by mouth daily at 6 PM. Take 1 tablet daily 04/14/18  Yes End, Harrell Gave, MD  albuterol  (PROVENTIL HFA;VENTOLIN HFA) 108 (90 Base) MCG/ACT inhaler Inhale 2 puffs into the lungs every 6 (six) hours as needed. 04/13/17 04/13/18  [provider]  ondansetron (ZOFRAN ODT) 4 MG disintegrating tablet Take 1 tablet (4 mg total) by mouth every 8 (eight) hours as needed for nausea or vomiting. Patient not taking: Reported on 02/26/2018 12/26/17   Earleen Newport, MD    Allergies Penicillin g and Succinylcholine  Family History  Problem Relation Age of Onset  . Cancer Mother   . Stomach cancer Mother   . Heart disease Father   . Hypertension Father   . Parkinson's disease Sister   . Cervical cancer Sister     Social History Social History   Tobacco Use  . Smoking status: Current Every Day Smoker    Packs/day: 0.50    Years: 40.00    Pack years: 20.00  . Smokeless tobacco: Never Used  . Tobacco comment: denied smoking info  Substance Use Topics  .  Alcohol use: No  . Drug use: No    Review of Systems  Constitutional: No fever/chills Eyes: No visual changes. ENT: No sore throat. Cardiovascular: Denies chest pain. Respiratory: Denies shortness of breath. Gastrointestinal: Positive for abdominal pain, nausea, vomiting and diarrhea.  No constipation. Genitourinary: Negative for dysuria. Musculoskeletal: Negative for back pain. Skin: Negative for rash. Neurological: Negative for headaches, focal weakness or numbness.   ____________________________________________   PHYSICAL EXAM:  VITAL SIGNS: ED Triage Vitals  Enc Vitals Group     BP 04/18/18 0143 (!) 135/94     Pulse Rate 04/18/18 0143 (!) 115     Resp 04/18/18 0143 15     Temp 04/18/18 0143 98.3 F (36.8 C)     Temp Source 04/18/18 0143 Oral     SpO2 04/18/18 0143 96 %     Weight 04/18/18 0124 145 lb (65.8 kg)     Height 04/18/18 0124 5\' 4"  (1.626 m)     Head Circumference --      Peak Flow --      Pain Score 04/18/18 0123 3     Pain Loc --      Pain Edu? --      Excl. in [place]? --      Constitutional: Alert and oriented. Well appearing and in mild acute distress. Eyes: Conjunctivae are normal. PERRL. EOMI. Head: Atraumatic. Nose: No congestion/rhinnorhea. Mouth/Throat: Mucous membranes are moist.  Widespread dental decay. Neck: No stridor.   Cardiovascular: Normal rate, regular rhythm. Grossly normal heart sounds.  Good peripheral circulation. Respiratory: Normal respiratory effort.  No retractions. Lungs CTAB. Gastrointestinal: Soft and mildly tender to palpation diffusely without rebound or guarding.  Watery yellow stool noted in ostomy bag.  Ventral wall hernia noted.  No distention. No abdominal bruits. No CVA tenderness. Musculoskeletal: No lower extremity tenderness nor edema.  No joint effusions. Neurologic:  Normal speech and language. No gross focal neurologic deficits are appreciated. No gait instability. Skin:  Skin is warm, dry and intact. No rash noted. Psychiatric: Mood and affect are normal. Speech and behavior are normal.  ____________________________________________   LABS (all labs ordered are listed, but only abnormal results are displayed)  Labs Reviewed  CBC WITH DIFFERENTIAL/PLATELET - Abnormal; Notable for the following components:      Result Value   WBC 13.0 (*)    RBC 5.32 (*)    Hemoglobin 16.9 (*)    HCT 48.0 (*)    Neutro Abs 11.2 (*)    Lymphs Abs 0.7 (*)    Monocytes Absolute 1.0 (*)    All other components within normal limits  COMPREHENSIVE METABOLIC PANEL - Abnormal; Notable for the following components:   Potassium 3.4 (*)    Chloride 99 (*)    Glucose, Bld 181 (*)    Creatinine, Ser 1.11 (*)    Total Protein 8.8 (*)    ALT 12 (*)    GFR calc non Af Amer 50 (*)    GFR calc Af Amer 58 (*)    All other components within normal limits  C DIFFICILE QUICK SCREEN W PCR REFLEX  GASTROINTESTINAL PANEL BY PCR, STOOL (REPLACES STOOL CULTURE)  LIPASE, BLOOD  TROPONIN I  PROTIME-INR  URINALYSIS, COMPLETE (UACMP) WITH  MICROSCOPIC  HIV ANTIBODY (ROUTINE TESTING)  APTT   ____________________________________________  EKG  ED ECG REPORT I, SUNG,JADE J, the attending physician, personally viewed and interpreted this ECG.   Date: 04/18/2018  EKG Time: 0131  Rate: 111  Rhythm: sinus tachycardia  Axis: RAD  Intervals:none  ST&T Change: Nonspecific  ____________________________________________  RADIOLOGY  ED MD interpretation: Small bowel obstruction  Official radiology report(s): Dg Abdomen 1 View  Result Date: 04/18/2018 CLINICAL DATA:  NG tube placement. EXAM: ABDOMEN - 1 VIEW COMPARISON:  CT earlier this day. FINDINGS: Tip and side port of the enteric tube below the diaphragm in the stomach. Dilated contrast filled small bowel in the right abdomen. Excreted IV contrast in the renal collecting systems and urinary bladder. IMPRESSION: Tip and side-port of the enteric tube below the diaphragm in the stomach Electronically Signed   By: Jeb Levering M.D.   On: 04/18/2018 06:08   Ct Abdomen Pelvis W Contrast  Result Date: 04/18/2018 CLINICAL DATA:  Nausea, vomiting and diarrhea. Patient with right lower quadrant colostomy. Recently passed formed bowel movement through the rectum which is not happened in years. EXAM: CT ABDOMEN AND PELVIS WITH CONTRAST TECHNIQUE: Multidetector CT imaging of the abdomen and pelvis was performed using the standard protocol following bolus administration of intravenous contrast. CONTRAST:  12mL ISOVUE-300 IOPAMIDOL (ISOVUE-300) INJECTION 61% COMPARISON:  Noncontrast CT 12/28/2017 FINDINGS: Lower chest: The lung bases are clear. Hepatobiliary: Decreased hepatic density suggesting steatosis. Fine nodular hepatic contours again seen. Postcholecystectomy with stable biliary prominence, common bile duct measures 13 mm at the porta hepatis with normal tapering distally. Pancreas: No ductal dilatation or inflammation. Spleen: Normal in size without focal abnormality.  Adrenals/Urinary Tract: No adrenal nodule. No hydronephrosis or perinephric edema. Homogeneous renal enhancement with symmetric excretion on delayed phase imaging. Again seen cyst in the anterior mid right kidney, with additional tiny cortical hypodensities. Cortical scarring in the left upper pole. Urinary bladder is physiologically distended without wall thickening. Stomach/Bowel: Stomach is distended with enteric contrast. Findings suspicious for recurrent small bowel obstruction with dilated fluid-filled small bowel with transition point in the right lower quadrant peristomal hernia. More distal small bowel is decompressed. Mild stranding in the peristomal hernia with trace free fluid. The appendix is visualized and is normal. The colon is decompressed, which answers colon extending into anterior abdominal wall laxity. Unchanged enteric sutures in the sigmoid colon. The previous enteric contrast throughout the colon on prior exam has been evacuated, including the previous barium in the colon. No evidence of colo enteric fistula. Small mural lipoma in the ascending colon measuring 15 mm Vascular/Lymphatic: Aorto bi-iliac atherosclerosis. Small retroperitoneal nodes not enlarged by size criteria. Portal vein and mesenteric vessels are patent. No portal venous or mesenteric gas. Reproductive: Status post hysterectomy. No adnexal masses. Other: Laxity of the anterior abdominal wall musculature with stomach and transverse colon extending into the lax defect. No ascites or free air in the abdomen or pelvis. No intra-abdominal abscess. Musculoskeletal: Bones are under mineralized. There are no acute or suspicious osseous abnormalities. Degenerative change in the lumbar spine. IMPRESSION: 1. Findings suspicious for recurrent small bowel obstruction with transition point in the right lower quadrant parastomal hernia. 2. Evacuation of colonic contrast since February 2019 CT. No evidence of colo enteric fistula. 3. Nodular  hepatic contours suspicious for cirrhosis. Hepatic steatosis. 4.  Aortic Atherosclerosis (ICD10-I70.0). Electronically Signed   By: Jeb Levering M.D.   On: 04/18/2018 04:13    ____________________________________________   PROCEDURES  Procedure(s) performed: None  Procedures  Critical Care performed: Yes, see critical care note(s)   CRITICAL CARE Performed by: Paulette Blanch   Total critical care time: 45 minutes  Critical care time was exclusive of separately billable procedures and treating other patients.  Critical care  was necessary to treat or prevent imminent or life-threatening deterioration.  Critical care was time spent personally by me on the following activities: development of treatment plan with patient and/or surrogate as well as nursing, discussions with consultants, evaluation of patient's response to treatment, examination of patient, obtaining history from patient or surrogate, ordering and performing treatments and interventions, ordering and review of laboratory studies, ordering and review of radiographic studies, pulse oximetry and re-evaluation of patient's condition.  ____________________________________________   INITIAL IMPRESSION / ASSESSMENT AND PLAN / ED COURSE  As part of my medical decision making, I reviewed the following data within the [place] notes reviewed and incorporated, Labs reviewed, EKG interpreted, Old chart reviewed, Radiograph reviewed and Notes from prior ED visits   67 year old female with ileostomy who presents with generalized abdominal pain, nausea/vomiting/diarrhea. Differential diagnosis includes, but is not limited to, biliary disease (biliary colic, acute cholecystitis, cholangitis, choledocholithiasis, etc), intrathoracic causes for epigastric abdominal pain including ACS, gastritis, duodenitis, pancreatitis, small bowel or large bowel obstruction, diverticulitis, abdominal aortic aneurysm, hernia,  and ulcer(s).  Will obtain screening lab work and urinalysis, initiate IV fluid resuscitation, 50 mcg IV fentanyl for pain, 4 mg IV Zofran for nausea and proceed with CT abdomen/pelvis to evaluate for intra-abdominal etiology of patient's symptoms.  We will also collect stool for samples.   Clinical Course as of Apr 18 624  Sun Apr 18, 2018  0446 Updated patient of laboratory and CT results.  C. difficile is negative.  Biofire is pending.  Spoke with Dr. Rosana Hoes who recommends NG tube.  Will place admission orders and start patient on heparin bolus and drip which will be started in the emergency department.   [JS]  9518 Addendum on chart review: Biofire is negative.   [JS]    Clinical Course User Index [JS] Paulette Blanch, MD     ____________________________________________   FINAL CLINICAL IMPRESSION(S) / ED DIAGNOSES  Final diagnoses:  Generalized abdominal pain  Nausea vomiting and diarrhea  SBO (small bowel obstruction) ([place])  Parastomal hernia with obstruction and without gangrene     ED Discharge Orders    None       Note:  This document was prepared using Dragon voice recognition software and may include unintentional dictation errors.    Paulette Blanch, MD 04/18/18 [id number]

## 2018-04-18 NOTE — ED Notes (Signed)
Pt reports on Saturday morning she passed gas and a formed BM through her rectum. Has not had that happen in years Pt also reports she has pain across her epigastric region since Saturday morning and has vomited x 2 in the last 24 hours. Pt has colostomy in place to her RLQ from hx of diverticulitis and a fistula.

## 2018-04-18 NOTE — Progress Notes (Signed)
ANTICOAGULATION CONSULT NOTE - Initial Consult  Pharmacy Consult for heparin drip Indication: atrial fibrillation  Allergies  Allergen Reactions  . Penicillin G Hives and Itching    Has patient had a PCN reaction causing immediate rash, facial/tongue/throat swelling, SOB or lightheadedness with hypotension: Yes Has patient had a PCN reaction causing severe rash involving mucus membranes or skin necrosis: No Has patient had a PCN reaction that required hospitalization: No Has patient had a PCN reaction occurring within the last 10 years: Yes If all of the above answers are "NO", then may proceed with Cephalosporin use.  . Succinylcholine     Patient's daughter had emergency surgery and was awake for the entire procedure, was told she should not have succ's and neither should her mom     Patient Measurements: Height: 5\' 4"  (162.6 cm) Weight: 145 lb (65.8 kg) IBW/kg (Calculated) : 54.7 Heparin Dosing Weight: 66kg  Vital Signs: Temp: 98.3 F (36.8 C) (06/16 0143) Temp Source: Oral (06/16 0143) BP: 141/94 (06/16 0300) Pulse Rate: 119 (06/16 0230)  Labs: Recent Labs    04/18/18 0145  HGB 16.9*  HCT 48.0*  PLT 258  LABPROT 13.6  INR 1.05  CREATININE 1.11*  TROPONINI <0.03    Estimated Creatinine Clearance: 45.3 mL/min (A) (by C-G formula based on SCr of 1.11 mg/dL (H)).   Medical History: Past Medical History:  Diagnosis Date  . CAD in native artery 1991   a. s/p CABG in 1991 in [place]; b. 06/2017 MV: EF 72%, no ischemia/infarct-->Low risk.  . Cancer of left lung ([place]) 2016   a. 12/2015 LUL lung resection.  . Chronic Dyspnea on exertion   . CKD (chronic kidney disease) stage 3, GFR 30-59 ml/min ([place]) 04/09/2017   a. 04/2017 ARF & Hyperkalemia req HD x 1.  . 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 Diverticulitis   . Diverticulitis   . Dyspnea   . Essential hypertension 04/09/2017  . Family history of adverse reaction to anesthesia   . History of sebaceous cyst 02/28/2017  . History  of stress test   . Intermittent vertigo 04/09/2017  . Itching 02/28/2017  . Left Atrial Appendage Thrombus    a. Incidentally noted on CT 04/2017;  b. 04/2017 Echo: EF 65-70%, Gr1 DD, no LA filling defect seen; c. 05/2017 TEE: EF 60-65%, no RA/LA thrombus; d. Later found to have PAF on Event monitor 06/2017-->initially placed on eliquis; changed to coumadin 2/2 $.  . PAF (paroxysmal atrial fibrillation) ([place])    a. Discovered 06/2017 on Event monitor as part of w/u for LAA thrombus. CHA2DS2VASc = 4-->Eliquis.  . Stroke [facility])    a. Pt reports prior h/o CVA; b. 07/2017 Head CT: Chronic atrophy and small vessel ischemic changes. No acute intracranial abnormalities.    Medications:  Warfarin as outpatient. Home dose 5 mg daily.  Assessment: INR subtherapeutic on admission  Goal of Therapy:  Heparin level 0.3-0.7 units/ml Monitor platelets by anticoagulation protocol: Yes   Plan:  4000 unit bolus and initial rate of 1000 units/hr. First heparin level 6 hours after start of infusion.  Vonda Harth S 04/18/2018,5:40 AM

## 2018-04-19 DIAGNOSIS — R112 Nausea with vomiting, unspecified: Secondary | ICD-10-CM

## 2018-04-19 DIAGNOSIS — R197 Diarrhea, unspecified: Secondary | ICD-10-CM

## 2018-04-19 LAB — PROTIME-INR
INR: 1.46
PROTHROMBIN TIME: 17.6 s — AB (ref 11.4–15.2)

## 2018-04-19 LAB — BASIC METABOLIC PANEL
ANION GAP: 8 (ref 5–15)
BUN: 6 mg/dL (ref 6–20)
CALCIUM: 8.4 mg/dL — AB (ref 8.9–10.3)
CO2: 23 mmol/L (ref 22–32)
Chloride: 106 mmol/L (ref 101–111)
Creatinine, Ser: 1.04 mg/dL — ABNORMAL HIGH (ref 0.44–1.00)
GFR calc Af Amer: >60 mL/min (ref >60)
GFR, EST NON AFRICAN AMERICAN: 54 mL/min — AB (ref >60)
Glucose, Bld: 128 mg/dL — ABNORMAL HIGH (ref 65–99)
POTASSIUM: 3.4 mmol/L — AB (ref 3.5–5.1)
Sodium: 137 mmol/L (ref 135–145)

## 2018-04-19 LAB — CBC
HCT: 39.6 % (ref 35.0–47.0)
Hemoglobin: 13.8 g/dL (ref 12.0–16.0)
MCH: 31.9 pg (ref 26.0–34.0)
MCHC: 34.9 g/dL (ref 32.0–36.0)
MCV: 91.6 fL (ref 80.0–100.0)
PLATELETS: 169 10*3/uL (ref 150–440)
RBC: 4.32 MIL/uL (ref 3.80–5.20)
RDW: 12.7 % (ref 11.5–14.5)
WBC: 6 10*3/uL (ref 3.6–11.0)

## 2018-04-19 LAB — ECHOCARDIOGRAM COMPLETE
Height: 64 in
WEIGHTICAEL: 2320 [oz_av]

## 2018-04-19 LAB — HEPARIN LEVEL (UNFRACTIONATED): HEPARIN UNFRACTIONATED: 0.35 [IU]/mL (ref 0.30–0.70)

## 2018-04-19 MED ORDER — WARFARIN SODIUM 5 MG PO TABS
5.0000 mg | ORAL_TABLET | Freq: Every day | ORAL | Status: AC
  Administered 2018-04-19: 5 mg via ORAL
  Filled 2018-04-19: qty 1

## 2018-04-19 MED ORDER — WARFARIN - PHYSICIAN DOSING INPATIENT: Freq: Every day | Status: DC

## 2018-04-19 NOTE — Care Management (Signed)
Patient was not able to secure transportation home.  Provided her with a cab voucher and discussed that she will need to make contact with DSS/[employer] for application.  Says that she has called and left messages but "one one ever returns my calls."  Uses [facility] pharmacy as they will deliver meds.  There is no indication that patient has not been obtaining her medications by pharmacist. Her residence is not near the [place] bus route.  Declines home health.

## 2018-04-19 NOTE — Discharge Summary (Signed)
Patient ID: Carly Khan MRN: [id number] DOB/AGE: [birth year] 67 y.o.  Admit date: 04/18/2018 Discharge date: 04/19/2018   Discharge Diagnoses:  Active Problems:   SBO (small bowel obstruction) [facility])     Hospital Course: 67 year old female with multiple comorbidities including A. fib on anticoagulation and noncompliance.  She had a previous history of ileostomy and sigmoid colectomy in the past and outside facility.  Has had a history of lung cancer and certain medical issues she has not been compliant with.  Now presents with pleuritis versus ileus.  She was admitted to the hospital for IV hydration n.p.o. and did well.  Her diet was advanced from a clear liquid diet to a regular diet which tolerated well.  She was placed on a heparin drip and then transition to a p.o. Coumadin.  Her INR was found to be subtherapeutic but when asking the patient she was noncompliant before.  At the time of discharge she was ambulating, she had no pain.  Her vital signs were stable and she was afebrile.  Physical exam showed a female no acute distress.  Awake and alert.  Abdomen: Soft nontender no peritonitis.  Ileostomy working with parastomal hernia. Addition of the patient time of discharge was stable SHe is to follow  with Dr. Hampton Abbot as an outpatient as previously scheduled.  Consults: hospitalist  Disposition: Discharge disposition: 01-Home or Self Care       Discharge Instructions    Call MD for:  difficulty breathing, headache or visual disturbances   Complete by:  As directed    Call MD for:  extreme fatigue   Complete by:  As directed    Call MD for:  hives   Complete by:  As directed    Call MD for:  persistant dizziness or light-headedness   Complete by:  As directed    Call MD for:  persistant nausea and vomiting   Complete by:  As directed    Call MD for:  redness, tenderness, or signs of infection (pain, swelling, redness, odor or green/yellow discharge around incision site)    Complete by:  As directed    Call MD for:  severe uncontrolled pain   Complete by:  As directed    Call MD for:  temperature >100.4   Complete by:  As directed    Diet - low sodium heart healthy   Complete by:  As directed    Discharge instructions   Complete by:  As directed    PT to resume coumadin daily   Increase activity slowly   Complete by:  As directed      Allergies as of 04/19/2018      Reactions   Penicillin G Hives, Itching   Has patient had a PCN reaction causing immediate rash, facial/tongue/throat swelling, SOB or lightheadedness with hypotension: Yes Has patient had a PCN reaction causing severe rash involving mucus membranes or skin necrosis: No Has patient had a PCN reaction that required hospitalization: No Has patient had a PCN reaction occurring within the last 10 years: Yes If all of the above answers are "NO", then may proceed with Cephalosporin use.   Succinylcholine    Patient's daughter had emergency surgery and was awake for the entire procedure, was told she should not have succ's and neither should her mom      Medication List    STOP taking these medications   ondansetron 4 MG disintegrating tablet Commonly known as:  ZOFRAN ODT     TAKE these medications  acetaminophen 500 MG tablet Commonly known as:  TYLENOL Take 500 mg by mouth every 6 (six) hours as needed (for pain).   albuterol 108 (90 Base) MCG/ACT inhaler Commonly known as:  PROVENTIL HFA;VENTOLIN HFA Inhale 2 puffs into the lungs every 6 (six) hours as needed.   BREO ELLIPTA 100-25 MCG/INH Aepb Generic drug:  fluticasone furoate-vilanterol Inhale 1 puff into the lungs 2 (two) times daily.   metoprolol succinate 25 MG 24 hr tablet Commonly known as:  TOPROL-XL Take 1 tablet (25 mg total) by mouth daily.   omeprazole 20 MG capsule Commonly known as:  PRILOSEC Take 1 capsule (20 mg total) by mouth 2 (two) times daily before a meal.   pravastatin 10 MG tablet Commonly known  as:  PRAVACHOL Take 10 mg by mouth daily.   warfarin 5 MG tablet Commonly known as:  COUMADIN Take as directed. If you are unsure how to take this medication, talk to your nurse or doctor. Original instructions:  Take 1 tablet (5 mg total) by mouth daily at 6 PM. Take 1 tablet daily      Follow-up Information    Glendon Axe, MD Follow up in 1 week(s).   Specialty:  Internal Medicine Contact information: [facility] [address] [phone]            Caroleen Hamman, MD FACS

## 2018-04-19 NOTE — Progress Notes (Signed)
Spoke with patient about warfarin. She states that she wasn't taking in the past, but tells me that she has taken it everyday since wed 6/12. She takes it at [facility]. She came in to hospital very early Sunday 6/16 AM. Did not get a dose 6/16. Current dose is 5mg  daily. INR this AM is 1.46. After discussion with Dr. Dahlia Byes, we will not make any changes to the dose at this time. Pt has a follow up appointment with coagulation clinic on 6/19 (wed). I strongly encouraged patient to keep this appointment.  Educated patient on the reasons behind taking warfarin (to decrease stroke risk due to afib and apical thrombus). Encouraged her to take as directed everyday. Reviewed foods high in vit K and asked her to keep consumption consistent week to week. Reviewed s/sx of bleeding. Pt states that her stoma site bleeds when she changes the bag. States that she rinses it in the bath and it eventually stops bleeding. CBC is stable at this time. Told her that she will take longer to stop bleeding if she cuts herself or her skin becomes raw. Encouraged her to apply pressure. Reviewed for her to report blood in colostomy bag, urine, spitting up blood. Reports history of nose bleeds on blood thinners but that they stopped after they packed her nose. Encouraged her to seek medical care if nose bleeds don't stop in reasonable amount of time and then can pack it or apply medication to help it stop bleeding. If they become excessive we can talk about weighing benefits with risks, but at this time the benefit of thinning the blood outweights the risk of nose bleeds. All questions answered for patient and I again encouraged her to be compliant with medications and appointments  Ramond Dial, Pharm.D, BCPS Clinical Pharmacist

## 2018-04-19 NOTE — Progress Notes (Addendum)
ANTICOAGULATION CONSULT NOTE - Initial Consult  Pharmacy Consult for heparin drip Indication: atrial fibrillation  Allergies  Allergen Reactions  . Penicillin G Hives and Itching    Has patient had a PCN reaction causing immediate rash, facial/tongue/throat swelling, SOB or lightheadedness with hypotension: Yes Has patient had a PCN reaction causing severe rash involving mucus membranes or skin necrosis: No Has patient had a PCN reaction that required hospitalization: No Has patient had a PCN reaction occurring within the last 10 years: Yes If all of the above answers are "NO", then may proceed with Cephalosporin use.  . Succinylcholine     Patient's daughter had emergency surgery and was awake for the entire procedure, was told she should not have succ's and neither should her mom     Patient Measurements: Height: 5\' 4"  (162.6 cm) Weight: 145 lb (65.8 kg) IBW/kg (Calculated) : 54.7 Heparin Dosing Weight: 66kg  Vital Signs: Temp: 98 F (36.7 C) (06/16 2001) Temp Source: Oral (06/16 2001) BP: 166/87 (06/16 2001) Pulse Rate: 76 (06/16 2001)  Labs: Recent Labs    04/18/18 0145 04/18/18 0659 04/18/18 1359 04/18/18 2210  HGB 16.9*  --   --   --   HCT 48.0*  --   --   --   PLT 258  --   --   --   APTT  --  31  --   --   LABPROT 13.6  --   --   --   INR 1.05  --   --   --   HEPARINUNFRC  --   --  1.07* 0.41  CREATININE 1.11*  --   --   --   TROPONINI <0.03  --   --   --     Estimated Creatinine Clearance: 45.3 mL/min (A) (by C-G formula based on SCr of 1.11 mg/dL (H)).   Medical History: Past Medical History:  Diagnosis Date  . CAD in native artery 1991   a. s/p CABG in 1991 in [place]; b. 06/2017 MV: EF 72%, no ischemia/infarct-->Low risk.  . Cancer of left lung ([place]) 2016   a. 12/2015 LUL lung resection.  . Chronic Dyspnea on exertion   . CKD (chronic kidney disease) stage 3, GFR 30-59 ml/min ([place]) 04/09/2017   a. 04/2017 ARF & Hyperkalemia req HD x 1.  . 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 Diverticulitis   . Diverticulitis   . Dyspnea   . Essential hypertension 04/09/2017  . Family history of adverse reaction to anesthesia   . History of sebaceous cyst 02/28/2017  . History of stress test   . Intermittent vertigo 04/09/2017  . Itching 02/28/2017  . Left Atrial Appendage Thrombus    a. Incidentally noted on CT 04/2017;  b. 04/2017 Echo: EF 65-70%, Gr1 DD, no LA filling defect seen; c. 05/2017 TEE: EF 60-65%, no RA/LA thrombus; d. Later found to have PAF on Event monitor 06/2017-->initially placed on eliquis; changed to coumadin 2/2 $.  . PAF (paroxysmal atrial fibrillation) ([place])    a. Discovered 06/2017 on Event monitor as part of w/u for LAA thrombus. CHA2DS2VASc = 4-->Eliquis.  . Stroke [facility])    a. Pt reports prior h/o CVA; b. 07/2017 Head CT: Chronic atrophy and small vessel ischemic changes. No acute intracranial abnormalities.    Medications:  Warfarin as outpatient. Home dose 5 mg daily.  Assessment: INR subtherapeutic on admission  Goal of Therapy:  Heparin level 0.3-0.7 units/ml Monitor platelets by anticoagulation protocol: Yes   Plan:  Hold heparin x 1 hours and resume heparin infusion at 800 units/hr. Recheck a HL in 6 hours.   06/16 2200 heparin level 0.41. Continue current regimen. Recheck heparin level and CBC with tomorrow AM labs.  6/17 AM heparin level 0.35. Continue current regimen. Recheck heparin level and CBC with tomorrow AM labs.  Carly Khan S 04/19/2018,12:24 AM

## 2018-04-19 NOTE — Progress Notes (Signed)
[place] [facility] [place] NAME: Carly Khan    MR#:  [id number]  DATE OF BIRTH:  11-Aug-1951  SUBJECTIVE:  CHIEF COMPLAINT:   Chief Complaint  Patient presents with  . Abdominal Pain  Patient seen today No abdominal pain No nausea and vomitngs  REVIEW OF SYSTEMS:    ROS  CONSTITUTIONAL: No documented fever. No fatigue, weakness. No weight gain, no weight loss.  EYES: No blurry or double vision.  ENT: No tinnitus. No postnasal drip. No redness of the oropharynx.  RESPIRATORY: No cough, no wheeze, no hemoptysis. No dyspnea.  CARDIOVASCULAR: No chest pain. No orthopnea. No palpitations. No syncope.  GASTROINTESTINAL: No nausea, no vomiting or diarrhea. No abdominal pain. No melena or hematochezia.  GENITOURINARY: No dysuria or hematuria.  ENDOCRINE: No polyuria or nocturia. No heat or cold intolerance.  HEMATOLOGY: No anemia. No bruising. No bleeding.  INTEGUMENTARY: No rashes. No lesions.  MUSCULOSKELETAL: No arthritis. No swelling. No gout.  NEUROLOGIC: No numbness, tingling, or ataxia. No seizure-type activity.  PSYCHIATRIC: No anxiety. No insomnia. No ADD.   DRUG ALLERGIES:   Allergies  Allergen Reactions  . Penicillin G Hives and Itching    Has patient had a PCN reaction causing immediate rash, facial/tongue/throat swelling, SOB or lightheadedness with hypotension: Yes Has patient had a PCN reaction causing severe rash involving mucus membranes or skin necrosis: No Has patient had a PCN reaction that required hospitalization: No Has patient had a PCN reaction occurring within the last 10 years: Yes If all of the above answers are "NO", then may proceed with Cephalosporin use.  . Succinylcholine     Patient's daughter had emergency surgery and was awake for the entire procedure, was told she should not have succ's and neither should her mom     VITALS:  Blood pressure (!) 159/66, pulse 64, temperature 98.9 F (37.2 C),  temperature source Oral, resp. rate 20, height 5\' 4"  (1.626 m), weight 65.8 kg (145 lb), SpO2 96 %.  PHYSICAL EXAMINATION:   Physical Exam  GENERAL:  68 y.o.-year-old patient lying in the bed with no acute distress.  EYES: Pupils equal, round, reactive to light and accommodation. No scleral icterus. Extraocular muscles intact.  HEENT: Head atraumatic, normocephalic. Oropharynx and nasopharynx clear.  NECK:  Supple, no jugular venous distention. No thyroid enlargement, no tenderness.  LUNGS: Normal breath sounds bilaterally, no wheezing, rales, rhonchi. No use of accessory muscles of respiration.  CARDIOVASCULAR: S1, S2 normal. No murmurs, rubs, or gallops.  ABDOMEN: Soft, nontender, nondistended. Bowel sounds present. No organomegaly or mass.  Has ileostomy EXTREMITIES: No cyanosis, clubbing or edema b/l.    NEUROLOGIC: Cranial nerves II through XII are intact. No focal Motor or sensory deficits b/l.   PSYCHIATRIC: The patient is alert and oriented x 3.  SKIN: No obvious rash, lesion, or ulcer.   LABORATORY PANEL:   CBC Recent Labs  Lab 04/19/18 0411  WBC 6.0  HGB 13.8  HCT 39.6  PLT 169   ------------------------------------------------------------------------------------------------------------------ Chemistries  Recent Labs  Lab 04/18/18 0145 04/19/18 0411  NA 136 137  K 3.4* 3.4*  CL 99* 106  CO2 23 23  GLUCOSE 181* 128*  BUN 8 6  CREATININE 1.11* 1.04*  CALCIUM 9.6 8.4*  AST 36  --   ALT 12*  --   ALKPHOS 100  --   BILITOT 1.2  --    ------------------------------------------------------------------------------------------------------------------  Cardiac Enzymes Recent Labs  Lab 04/18/18 0145  TROPONINI <0.03   ------------------------------------------------------------------------------------------------------------------  RADIOLOGY:  Dg Abdomen 1 View  Result Date: 04/18/2018 CLINICAL DATA:  NG tube placement. EXAM: ABDOMEN - 1 VIEW COMPARISON:   CT earlier this day. FINDINGS: Tip and side port of the enteric tube below the diaphragm in the stomach. Dilated contrast filled small bowel in the right abdomen. Excreted IV contrast in the renal collecting systems and urinary bladder. IMPRESSION: Tip and side-port of the enteric tube below the diaphragm in the stomach Electronically Signed   By: Jeb Levering M.D.   On: 04/18/2018 06:08   Ct Abdomen Pelvis W Contrast  Result Date: 04/18/2018 CLINICAL DATA:  Nausea, vomiting and diarrhea. Patient with right lower quadrant colostomy. Recently passed formed bowel movement through the rectum which is not happened in years. EXAM: CT ABDOMEN AND PELVIS WITH CONTRAST TECHNIQUE: Multidetector CT imaging of the abdomen and pelvis was performed using the standard protocol following bolus administration of intravenous contrast. CONTRAST:  125mL ISOVUE-300 IOPAMIDOL (ISOVUE-300) INJECTION 61% COMPARISON:  Noncontrast CT 12/28/2017 FINDINGS: Lower chest: The lung bases are clear. Hepatobiliary: Decreased hepatic density suggesting steatosis. Fine nodular hepatic contours again seen. Postcholecystectomy with stable biliary prominence, common bile duct measures 13 mm at the porta hepatis with normal tapering distally. Pancreas: No ductal dilatation or inflammation. Spleen: Normal in size without focal abnormality. Adrenals/Urinary Tract: No adrenal nodule. No hydronephrosis or perinephric edema. Homogeneous renal enhancement with symmetric excretion on delayed phase imaging. Again seen cyst in the anterior mid right kidney, with additional tiny cortical hypodensities. Cortical scarring in the left upper pole. Urinary bladder is physiologically distended without wall thickening. Stomach/Bowel: Stomach is distended with enteric contrast. Findings suspicious for recurrent small bowel obstruction with dilated fluid-filled small bowel with transition point in the right lower quadrant peristomal hernia. More distal small bowel  is decompressed. Mild stranding in the peristomal hernia with trace free fluid. The appendix is visualized and is normal. The colon is decompressed, which answers colon extending into anterior abdominal wall laxity. Unchanged enteric sutures in the sigmoid colon. The previous enteric contrast throughout the colon on prior exam has been evacuated, including the previous barium in the colon. No evidence of colo enteric fistula. Small mural lipoma in the ascending colon measuring 15 mm Vascular/Lymphatic: Aorto bi-iliac atherosclerosis. Small retroperitoneal nodes not enlarged by size criteria. Portal vein and mesenteric vessels are patent. No portal venous or mesenteric gas. Reproductive: Status post hysterectomy. No adnexal masses. Other: Laxity of the anterior abdominal wall musculature with stomach and transverse colon extending into the lax defect. No ascites or free air in the abdomen or pelvis. No intra-abdominal abscess. Musculoskeletal: Bones are under mineralized. There are no acute or suspicious osseous abnormalities. Degenerative change in the lumbar spine. IMPRESSION: 1. Findings suspicious for recurrent small bowel obstruction with transition point in the right lower quadrant parastomal hernia. 2. Evacuation of colonic contrast since February 2019 CT. No evidence of colo enteric fistula. 3. Nodular hepatic contours suspicious for cirrhosis. Hepatic steatosis. 4.  Aortic Atherosclerosis (ICD10-I70.0). Electronically Signed   By: Jeb Levering M.D.   On: 04/18/2018 04:13     ASSESSMENT AND PLAN:   67 year old female patient with history of ileostomy status post diverticulitis with fistula, coronary artery disease, carcinoma left lung, chronic kidney disease stage III, hypertension, left atrial appendage thrombus, paroxysmal atrial fibrillation currently under surgical service for bowel obstruction.  -Small bowel obstruction resolved Surgery follow-up appreciated Tolerating diet ok  -History  of left atrial appendage thrombus with paroxysmal atrial fibrillation Has history of multiple CVA Follow-up  echocardiogram Start oral warfarin At discharge needs oral warfarin  -Chronic kidney disease stage III stable  -will sign off and f/u as needed     All the records are reviewed and case discussed with Care Management/Social Worker. Management plans discussed with the patient, family and they are in agreement.  CODE STATUS: Full code  DVT Prophylaxis: SCDs  TOTAL TIME TAKING CARE OF THIS PATIENT: 35 minutes.  Saundra Shelling M.D on 04/19/2018 at 9:32 AM  Between 7am to 6pm - Pager - [phone]  After 6pm go to [email or web address] - password [email or web address]  Office  [phone]  CC: Primary care physician; Glendon Axe, MD  Note: This dictation was prepared with Dragon dictation along with smaller phrase technology. Any transcriptional errors that result from this process are unintentional.

## 2018-04-19 NOTE — Progress Notes (Signed)
Carly Khan  A and O x 4. VSS. Pt tolerating diet well. No complaints of pain or nausea. IV removed intact, prescriptions given. Pt voiced understanding of discharge instructions with no further questions. Pt discharged via wheelchair with axillary.    Allergies as of 04/19/2018      Reactions   Penicillin G Hives, Itching   Has patient had a PCN reaction causing immediate rash, facial/tongue/throat swelling, SOB or lightheadedness with hypotension: Yes Has patient had a PCN reaction causing severe rash involving mucus membranes or skin necrosis: No Has patient had a PCN reaction that required hospitalization: No Has patient had a PCN reaction occurring within the last 10 years: Yes If all of the above answers are "NO", then may proceed with Cephalosporin use.   Succinylcholine    Patient's daughter had emergency surgery and was awake for the entire procedure, was told she should not have succ's and neither should her mom      Medication List    STOP taking these medications   ondansetron 4 MG disintegrating tablet Commonly known as:  ZOFRAN ODT     TAKE these medications   acetaminophen 500 MG tablet Commonly known as:  TYLENOL Take 500 mg by mouth every 6 (six) hours as needed (for pain).   albuterol 108 (90 Base) MCG/ACT inhaler Commonly known as:  PROVENTIL HFA;VENTOLIN HFA Inhale 2 puffs into the lungs every 6 (six) hours as needed.   BREO ELLIPTA 100-25 MCG/INH Aepb Generic drug:  fluticasone furoate-vilanterol Inhale 1 puff into the lungs 2 (two) times daily.   metoprolol succinate 25 MG 24 hr tablet Commonly known as:  TOPROL-XL Take 1 tablet (25 mg total) by mouth daily.   omeprazole 20 MG capsule Commonly known as:  PRILOSEC Take 1 capsule (20 mg total) by mouth 2 (two) times daily before a meal.   pravastatin 10 MG tablet Commonly known as:  PRAVACHOL Take 10 mg by mouth daily.   warfarin 5 MG tablet Commonly known as:  COUMADIN Take as directed. If you are  unsure how to take this medication, talk to your nurse or doctor. Original instructions:  Take 1 tablet (5 mg total) by mouth daily at 6 PM. Take 1 tablet daily       Vitals:   04/19/18 0349 04/19/18 1247  BP: (!) 159/66 (!) 143/75  Pulse: 64 66  Resp: 20 20  Temp: 98.9 F (37.2 C) 98 F (36.7 C)  SpO2: 96% 96%    Carly Khan

## 2018-04-19 NOTE — Progress Notes (Signed)
Following up on previous notes by the care team, the patient is fixated on receiving a pass for cab fair. The voucher is both a practical financial concern and symbolic of the frustration and powerlessness she feels.  What has not been noted is the patient's feelings of being disrespected, overlooked, unimportant, and regretful over pass decisions. The patient has a low self-image, often feeling that she can do nothing right. Her feelings are tied to her life narrative, which she expresses somewhat incoherently. The patient cried several times during the encounter when thinking about being lonely and lacking worth. Chaplain suggests that a voucher be secured and presented to her as a sign of [facility]'s commitment to walk with her during this hospital visit. Chaplain provided active listening, reflective questions, pastoral presence, and prayer. Patient found the visit helpful.

## 2018-04-19 NOTE — Care Management (Signed)
Spoke with patient regarding documentation indicating that was not taking her coumadin due to financial concerns.  Confirmed patient does have medicare part D coverage. It is very difficult to stay on topic with patient. When discussing her transportation issues, she starts discussing the reason her daughter can not help- her fiancee died next to her in Jul 21, 2023, his father died 30 days later and then an uncle died. Provided patient with transportation resources and informed her that [facility] could not provide anymore cab vouchers after today.  Patient denies having issues with transportation to MD appointments because she makes sure they are all on Wednesday when she has her [facility] aide. Gave cab voucher to patient's primary nurse in the event she does fail to have transportation home.

## 2018-04-19 NOTE — Discharge Instructions (Addendum)
Information on my medicine - Coumadin   (Warfarin)  This medication education was reviewed with me or my healthcare representative as part of my discharge preparation.  The pharmacist that spoke with me during my hospital stay was:  Ramond Dial, [facility]  Why was Coumadin prescribed for you? Coumadin was prescribed for you because you have a blood clot or a medical condition that can cause an increased risk of forming blood clots. Blood clots can cause serious health problems by blocking the flow of blood to the heart, lung, or brain. Coumadin can prevent harmful blood clots from forming. As a reminder your indication for Coumadin is:   Stroke Prevention Because Of Atrial Fibrillation  What test will check on my response to Coumadin? While on Coumadin (warfarin) you will need to have an INR test regularly to ensure that your dose is keeping you in the desired range. The INR (international normalized ratio) number is calculated from the result of the laboratory test called prothrombin time (PT).  If an INR APPOINTMENT HAS NOT ALREADY BEEN MADE FOR YOU please schedule an appointment to have this lab work done by your health care provider within 7 days. Your INR goal is usually a number between:  2 to 3 or your provider may give you a more narrow range like 2-2.5.  Ask your health care provider during an office visit what your goal INR is.  What  do you need to  know  About  COUMADIN? Take Coumadin (warfarin) exactly as prescribed by your healthcare provider about the same time each day.  DO NOT stop taking without talking to the doctor who prescribed the medication.  Stopping without other blood clot prevention medication to take the place of Coumadin may increase your risk of developing a new clot or stroke.  Get refills before you run out.  What do you do if you miss a dose? If you miss a dose, take it as soon as you remember on the same day then continue your regularly scheduled regimen the next  day.  Do not take two doses of Coumadin at the same time.  Important Safety Information A possible side effect of Coumadin (Warfarin) is an increased risk of bleeding. You should call your healthcare provider right away if you experience any of the following: ? Bleeding from an injury or your nose that does not stop. ? Unusual colored urine (red or dark brown) or unusual colored stools (red or black). ? Unusual bruising for unknown reasons. ? A serious fall or if you hit your head (even if there is no bleeding).  Some foods or medicines interact with Coumadin (warfarin) and might alter your response to warfarin. To help avoid this: ? Eat a balanced diet, maintaining a consistent amount of Vitamin K. ? Notify your provider about major diet changes you plan to make. ? Avoid alcohol or limit your intake to 1 drink for women and 2 drinks for men per day. (1 drink is 5 oz. wine, 12 oz. beer, or 1.5 oz. liquor.)  Make sure that ANY health care provider who prescribes medication for you knows that you are taking Coumadin (warfarin).  Also make sure the healthcare provider who is monitoring your Coumadin knows when you have started a new medication including herbals and non-prescription products.  Coumadin (Warfarin)  Major Drug Interactions  Increased Warfarin Effect Decreased Warfarin Effect  Alcohol (large quantities) Antibiotics (esp. Septra/Bactrim, Flagyl, Cipro) Amiodarone (Cordarone) Aspirin (ASA) Cimetidine (Tagamet) Megestrol (Megace) NSAIDs (ibuprofen,  naproxen, etc.) °Piroxicam (Feldene) °Propafenone (Rythmol SR) °Propranolol (Inderal) °Isoniazid (INH) °Posaconazole (Noxafil) Barbiturates (Phenobarbital) °Carbamazepine (Tegretol) °Chlordiazepoxide (Librium) °Cholestyramine (Questran) °Griseofulvin °Oral Contraceptives °Rifampin °Sucralfate (Carafate) °Vitamin K  ° °Coumadin® (Warfarin) Major Herbal Interactions  °Increased Warfarin Effect Decreased Warfarin Effect   °Garlic °Ginseng °Ginkgo biloba Coenzyme Q10 °Green tea °St. John’s wort   ° °Coumadin® (Warfarin) FOOD Interactions  °Eat a consistent number of servings per week of foods HIGH in Vitamin K °(1 serving = ½ cup)  °Collards (cooked, or boiled & drained) °Kale (cooked, or boiled & drained) °Mustard greens (cooked, or boiled & drained) °Parsley *serving size only = ¼ cup °Spinach (cooked, or boiled & drained) °Swiss chard (cooked, or boiled & drained) °Turnip greens (cooked, or boiled & drained)  °Eat a consistent number of servings per week of foods MEDIUM-HIGH in Vitamin K °(1 serving = 1 cup)  °Asparagus (cooked, or boiled & drained) °Broccoli (cooked, boiled & drained, or raw & chopped) °Brussel sprouts (cooked, or boiled & drained) *serving size only = ½ cup °Lettuce, raw (green leaf, endive, romaine) °Spinach, raw °Turnip greens, raw & chopped  ° °These websites have more information on Coumadin (warfarin):  www.coumadin.com; °www.ahrq.gov/consumer/coumadin.htm; ° ° ° °

## 2018-04-20 LAB — HIV ANTIBODY (ROUTINE TESTING W REFLEX): HIV Screen 4th Generation wRfx: NONREACTIVE

## 2018-04-21 ENCOUNTER — Ambulatory Visit (INDEPENDENT_AMBULATORY_CARE_PROVIDER_SITE_OTHER): Payer: Medicare Other

## 2018-04-21 DIAGNOSIS — I513 Intracardiac thrombosis, not elsewhere classified: Secondary | ICD-10-CM

## 2018-04-21 DIAGNOSIS — I480 Paroxysmal atrial fibrillation: Secondary | ICD-10-CM | POA: Diagnosis not present

## 2018-04-21 DIAGNOSIS — Z5181 Encounter for therapeutic drug level monitoring: Secondary | ICD-10-CM

## 2018-04-21 LAB — POCT INR: INR: 3.2 — AB (ref 2.0–3.0)

## 2018-04-21 NOTE — Patient Instructions (Signed)
Please START TAKING NEW DOSAGE of 1 tablet every day except 1/2 tablet on Mondays, Wednesdays and Fridays. Check INR in 1 week.

## 2018-04-28 ENCOUNTER — Ambulatory Visit (INDEPENDENT_AMBULATORY_CARE_PROVIDER_SITE_OTHER): Payer: Medicare Other

## 2018-04-28 ENCOUNTER — Other Ambulatory Visit
Admission: RE | Admit: 2018-04-28 | Discharge: 2018-04-28 | Disposition: A | Discharge status: Home / Self Care | Payer: Medicare Other | Source: Ambulatory Visit | Attending: Internal Medicine | Admitting: Internal Medicine

## 2018-04-28 DIAGNOSIS — Z7901 Long term (current) use of anticoagulants: Secondary | ICD-10-CM | POA: Insufficient documentation

## 2018-04-28 DIAGNOSIS — I513 Intracardiac thrombosis, not elsewhere classified: Secondary | ICD-10-CM | POA: Diagnosis not present

## 2018-04-28 DIAGNOSIS — R791 Abnormal coagulation profile: Secondary | ICD-10-CM

## 2018-04-28 DIAGNOSIS — I480 Paroxysmal atrial fibrillation: Secondary | ICD-10-CM | POA: Diagnosis not present

## 2018-04-28 DIAGNOSIS — Z5181 Encounter for therapeutic drug level monitoring: Secondary | ICD-10-CM | POA: Insufficient documentation

## 2018-04-28 LAB — PROTIME-INR
INR: 4.9 — AB (ref <=1.1)
INR: 4.89
Prothrombin Time: 45.3 seconds — ABNORMAL HIGH (ref 11.4–15.2)

## 2018-04-28 LAB — POCT INR: INR: 7.4 — AB (ref 2.0–3.0)

## 2018-04-28 NOTE — Patient Instructions (Addendum)
Please proceed to the [facility] for STAT labs. Please hold coumadin today & tomorrow, then please START NEW DOSAGE of 1/2 tablet daily.  Check INR in 1 week.

## 2018-04-29 ENCOUNTER — Emergency Department: Payer: Medicare Other

## 2018-04-29 ENCOUNTER — Other Ambulatory Visit: Payer: Self-pay

## 2018-04-29 ENCOUNTER — Inpatient Hospital Stay
Admission: EM | Admit: 2018-04-29 | Discharge: 2018-05-09 | DRG: 389 | Disposition: A | Discharge status: Home / Self Care | Payer: Medicare Other | Attending: Surgery | Admitting: Surgery

## 2018-04-29 ENCOUNTER — Encounter: Payer: Self-pay | Admitting: Intensive Care

## 2018-04-29 DIAGNOSIS — N179 Acute kidney failure, unspecified: Secondary | ICD-10-CM | POA: Diagnosis present

## 2018-04-29 DIAGNOSIS — R112 Nausea with vomiting, unspecified: Secondary | ICD-10-CM

## 2018-04-29 DIAGNOSIS — Z820 Family history of epilepsy and other diseases of the nervous system: Secondary | ICD-10-CM | POA: Diagnosis not present

## 2018-04-29 DIAGNOSIS — Z7901 Long term (current) use of anticoagulants: Secondary | ICD-10-CM

## 2018-04-29 DIAGNOSIS — N189 Chronic kidney disease, unspecified: Secondary | ICD-10-CM

## 2018-04-29 DIAGNOSIS — I129 Hypertensive chronic kidney disease with stage 1 through stage 4 chronic kidney disease, or unspecified chronic kidney disease: Secondary | ICD-10-CM | POA: Diagnosis present

## 2018-04-29 DIAGNOSIS — N3001 Acute cystitis with hematuria: Secondary | ICD-10-CM | POA: Diagnosis present

## 2018-04-29 DIAGNOSIS — Z951 Presence of aortocoronary bypass graft: Secondary | ICD-10-CM

## 2018-04-29 DIAGNOSIS — Z932 Ileostomy status: Secondary | ICD-10-CM | POA: Diagnosis not present

## 2018-04-29 DIAGNOSIS — K435 Parastomal hernia without obstruction or  gangrene: Secondary | ICD-10-CM | POA: Diagnosis present

## 2018-04-29 DIAGNOSIS — Z8049 Family history of malignant neoplasm of other genital organs: Secondary | ICD-10-CM | POA: Diagnosis not present

## 2018-04-29 DIAGNOSIS — Z880 Allergy status to penicillin: Secondary | ICD-10-CM | POA: Diagnosis not present

## 2018-04-29 DIAGNOSIS — Z8673 Personal history of transient ischemic attack (TIA), and cerebral infarction without residual deficits: Secondary | ICD-10-CM | POA: Diagnosis not present

## 2018-04-29 DIAGNOSIS — Z90710 Acquired absence of both cervix and uterus: Secondary | ICD-10-CM

## 2018-04-29 DIAGNOSIS — N183 Chronic kidney disease, stage 3 (moderate): Secondary | ICD-10-CM | POA: Diagnosis present

## 2018-04-29 DIAGNOSIS — Z9049 Acquired absence of other specified parts of digestive tract: Secondary | ICD-10-CM | POA: Diagnosis not present

## 2018-04-29 DIAGNOSIS — Z800 Family history of malignant neoplasm of digestive organs: Secondary | ICD-10-CM | POA: Diagnosis not present

## 2018-04-29 DIAGNOSIS — K56609 Unspecified intestinal obstruction, unspecified as to partial versus complete obstruction: Secondary | ICD-10-CM

## 2018-04-29 DIAGNOSIS — E860 Dehydration: Secondary | ICD-10-CM | POA: Diagnosis present

## 2018-04-29 DIAGNOSIS — I2510 Atherosclerotic heart disease of native coronary artery without angina pectoris: Secondary | ICD-10-CM | POA: Diagnosis present

## 2018-04-29 DIAGNOSIS — F17210 Nicotine dependence, cigarettes, uncomplicated: Secondary | ICD-10-CM | POA: Diagnosis present

## 2018-04-29 DIAGNOSIS — K560 Paralytic ileus: Secondary | ICD-10-CM | POA: Diagnosis not present

## 2018-04-29 DIAGNOSIS — I480 Paroxysmal atrial fibrillation: Secondary | ICD-10-CM | POA: Diagnosis present

## 2018-04-29 DIAGNOSIS — Z8249 Family history of ischemic heart disease and other diseases of the circulatory system: Secondary | ICD-10-CM | POA: Diagnosis not present

## 2018-04-29 DIAGNOSIS — J449 Chronic obstructive pulmonary disease, unspecified: Secondary | ICD-10-CM | POA: Diagnosis present

## 2018-04-29 DIAGNOSIS — K5651 Intestinal adhesions [bands], with partial obstruction: Secondary | ICD-10-CM | POA: Diagnosis present

## 2018-04-29 DIAGNOSIS — Z85118 Personal history of other malignant neoplasm of bronchus and lung: Secondary | ICD-10-CM

## 2018-04-29 DIAGNOSIS — K5652 Intestinal adhesions [bands] with complete obstruction: Secondary | ICD-10-CM | POA: Diagnosis not present

## 2018-04-29 LAB — COMPREHENSIVE METABOLIC PANEL
ALT: 9 U/L (ref 0–44)
ANION GAP: 17 — AB (ref 5–15)
AST: 28 U/L (ref 15–41)
Albumin: 4.4 g/dL (ref 3.5–5.0)
Alkaline Phosphatase: 87 U/L (ref 38–126)
BILIRUBIN TOTAL: 2.4 mg/dL — AB (ref 0.3–1.2)
BUN: 22 mg/dL (ref 8–23)
CHLORIDE: 94 mmol/L — AB (ref 98–111)
CO2: 24 mmol/L (ref 22–32)
Calcium: 9.5 mg/dL (ref 8.9–10.3)
Creatinine, Ser: 1.34 mg/dL — ABNORMAL HIGH (ref 0.44–1.00)
GFR, EST AFRICAN AMERICAN: 46 mL/min — AB (ref >60)
GFR, EST NON AFRICAN AMERICAN: 40 mL/min — AB (ref >60)
Glucose, Bld: 175 mg/dL — ABNORMAL HIGH (ref 70–99)
POTASSIUM: 4.2 mmol/L (ref 3.5–5.1)
Sodium: 135 mmol/L (ref 135–145)
TOTAL PROTEIN: 8.9 g/dL — AB (ref 6.5–8.1)

## 2018-04-29 LAB — CBC WITH DIFFERENTIAL/PLATELET
BASOS ABS: 0 10*3/uL (ref 0–0.1)
BASOS PCT: 0 %
EOS PCT: 0 %
Eosinophils Absolute: 0 10*3/uL (ref 0–0.7)
HEMATOCRIT: 51.8 % — AB (ref 35.0–47.0)
Hemoglobin: 17.9 g/dL — ABNORMAL HIGH (ref 12.0–16.0)
LYMPHS PCT: 10 %
Lymphs Abs: 1 10*3/uL (ref 1.0–3.6)
MCH: 31.3 pg (ref 26.0–34.0)
MCHC: 34.5 g/dL (ref 32.0–36.0)
MCV: 90.7 fL (ref 80.0–100.0)
MONO ABS: 0.5 10*3/uL (ref 0.2–0.9)
Monocytes Relative: 6 %
NEUTROS ABS: 7.8 10*3/uL — AB (ref 1.4–6.5)
Neutrophils Relative %: 84 %
PLATELETS: 300 10*3/uL (ref 150–440)
RBC: 5.71 MIL/uL — ABNORMAL HIGH (ref 3.80–5.20)
RDW: 13.2 % (ref 11.5–14.5)
WBC: 9.3 10*3/uL (ref 3.6–11.0)

## 2018-04-29 LAB — URINALYSIS, COMPLETE (UACMP) WITH MICROSCOPIC
Bilirubin Urine: NEGATIVE
GLUCOSE, UA: NEGATIVE mg/dL
Ketones, ur: NEGATIVE mg/dL
NITRITE: NEGATIVE
PROTEIN: 30 mg/dL — AB
SPECIFIC GRAVITY, URINE: 1.021 (ref 1.005–1.030)
WBC, UA: >50 WBC/hpf — ABNORMAL HIGH (ref 0–5)
pH: 5 (ref 5.0–8.0)

## 2018-04-29 LAB — TROPONIN I

## 2018-04-29 LAB — PROTIME-INR
INR: 3.16
Prothrombin Time: 32.2 seconds — ABNORMAL HIGH (ref 11.4–15.2)

## 2018-04-29 LAB — LIPASE, BLOOD: LIPASE: 28 U/L (ref 11–51)

## 2018-04-29 MED ORDER — SODIUM CHLORIDE 0.9 % IV SOLN
1.0000 g | Freq: Once | INTRAVENOUS | Status: AC
  Administered 2018-04-29: 1 g via INTRAVENOUS
  Filled 2018-04-29: qty 10

## 2018-04-29 MED ORDER — FAMOTIDINE IN NACL 20-0.9 MG/50ML-% IV SOLN
20.0000 mg | Freq: Two times a day (BID) | INTRAVENOUS | Status: DC
  Administered 2018-04-30 – 2018-05-07 (×15): 20 mg via INTRAVENOUS
  Filled 2018-04-29 (×15): qty 50

## 2018-04-29 MED ORDER — ONDANSETRON 4 MG PO TBDP: 4.0000 mg | ORAL_TABLET | Freq: Four times a day (QID) | ORAL | Status: DC | PRN

## 2018-04-29 MED ORDER — FLUTICASONE FUROATE-VILANTEROL 100-25 MCG/INH IN AEPB
1.0000 | INHALATION_SPRAY | Freq: Two times a day (BID) | RESPIRATORY_TRACT | Status: DC
  Administered 2018-04-30 – 2018-05-08 (×18): 1 via RESPIRATORY_TRACT
  Filled 2018-04-29 (×2): qty 28

## 2018-04-29 MED ORDER — METOPROLOL TARTRATE 5 MG/5ML IV SOLN
5.0000 mg | Freq: Four times a day (QID) | INTRAVENOUS | Status: DC
  Administered 2018-04-29 – 2018-05-07 (×22): 5 mg via INTRAVENOUS
  Filled 2018-04-29 (×26): qty 5

## 2018-04-29 MED ORDER — SODIUM CHLORIDE 0.9 % IV SOLN
INTRAVENOUS | Status: DC
  Administered 2018-04-29 – 2018-04-30 (×3): via INTRAVENOUS

## 2018-04-29 MED ORDER — LACTATED RINGERS IV SOLN
INTRAVENOUS | Status: DC
  Administered 2018-04-29: 23:00:00 via INTRAVENOUS

## 2018-04-29 MED ORDER — ONDANSETRON HCL 4 MG/2ML IJ SOLN
4.0000 mg | Freq: Once | INTRAMUSCULAR | Status: AC
  Administered 2018-04-29: 4 mg via INTRAVENOUS
  Filled 2018-04-29: qty 2

## 2018-04-29 MED ORDER — BENZOCAINE 20 % MT SOLN
OROMUCOSAL | Status: AC
  Filled 2018-04-29: qty 5

## 2018-04-29 MED ORDER — KETOROLAC TROMETHAMINE 15 MG/ML IJ SOLN
15.0000 mg | Freq: Four times a day (QID) | INTRAMUSCULAR | Status: AC | PRN
  Administered 2018-04-29 – 2018-05-04 (×3): 15 mg via INTRAVENOUS
  Filled 2018-04-29 (×3): qty 1

## 2018-04-29 MED ORDER — BENZOCAINE 20 % MT SOLN
Freq: Once | OROMUCOSAL | Status: AC
  Administered 2018-04-29: 22:00:00 via OROMUCOSAL

## 2018-04-29 MED ORDER — HYDROMORPHONE HCL 1 MG/ML IJ SOLN: 0.5000 mg | INTRAMUSCULAR | Status: DC | PRN

## 2018-04-29 MED ORDER — ALBUTEROL SULFATE (2.5 MG/3ML) 0.083% IN NEBU
2.5000 mg | INHALATION_SOLUTION | Freq: Four times a day (QID) | RESPIRATORY_TRACT | Status: DC | PRN
Start: 2018-04-29 — End: 2018-05-09

## 2018-04-29 MED ORDER — ONDANSETRON HCL 4 MG/2ML IJ SOLN
4.0000 mg | Freq: Four times a day (QID) | INTRAMUSCULAR | Status: DC | PRN
  Administered 2018-04-30 – 2018-05-06 (×6): 4 mg via INTRAVENOUS
  Filled 2018-04-29 (×6): qty 2

## 2018-04-29 MED ORDER — SODIUM CHLORIDE 0.9 % IV BOLUS
1000.0000 mL | Freq: Once | INTRAVENOUS | Status: AC
  Administered 2018-04-29: 1000 mL via INTRAVENOUS

## 2018-04-29 NOTE — ED Notes (Signed)
Patient transported to X-ray 

## 2018-04-29 NOTE — ED Provider Notes (Signed)
[facility] Emergency Department Provider Note  ____________________________________________  Time seen: Approximately 6:58 PM  I have reviewed the triage vital signs and the nursing notes.   HISTORY  Chief Complaint Nausea   HPI Carly Khan is a 67 y.o. female with a history of ileostomy status post diverticulitis with fistula, left atrial appendage thrombus on Coumadin, atrial fibrillation, CVA, CKD, lung cancer, CAD who presents for evaluation of nausea and vomiting.  Patient reports that this has been an ongoing issue for several months.  This episode started last night.  She reports numerous episodes of nonbloody nonbilious emesis.  She reports normal ostomy output.  She reports mild dull epigastric abdominal pain that happens right before she vomits and resolves every time she vomits.  She has no pain at this time.  She has felt cold but no fever or chills, no dysuria or hematuria, no chest pain or shortness of breath.  She reports that she has no medication for nausea at home.  She reports that her last INR was supratherapeutic 2 days ago and she was told to hold her Coumadin yesterday and today.  Past Medical History:  Diagnosis Date  . CAD in native artery 1991   a. s/p CABG in 1991 in [place]; b. 06/2017 MV: EF 72%, no ischemia/infarct-->Low risk.  . Cancer of left lung ([place]) 2016   a. 12/2015 LUL lung resection.  . Chronic Dyspnea on exertion   . CKD (chronic kidney disease) stage 3, GFR 30-59 ml/min ([place]) 04/09/2017   a. 04/2017 ARF & Hyperkalemia req HD x 1.  . 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 Diverticulitis   . Diverticulitis   . Dyspnea   . Essential hypertension 04/09/2017  . Family history of adverse reaction to anesthesia   . History of sebaceous cyst 02/28/2017  . History of stress test   . Intermittent vertigo 04/09/2017  . Itching 02/28/2017  . Left Atrial Appendage Thrombus    a. Incidentally noted on CT 04/2017;  b. 04/2017 Echo: EF 65-70%, Gr1 DD,  no LA filling defect seen; c. 05/2017 TEE: EF 60-65%, no RA/LA thrombus; d. Later found to have PAF on Event monitor 06/2017-->initially placed on eliquis; changed to coumadin 2/2 $.  . PAF (paroxysmal atrial fibrillation) ([place])    a. Discovered 06/2017 on Event monitor as part of w/u for LAA thrombus. CHA2DS2VASc = 4-->Eliquis.  . Stroke [facility])    a. Pt reports prior h/o CVA; b. 07/2017 Head CT: Chronic atrophy and small vessel ischemic changes. No acute intracranial abnormalities.    Patient Active Problem List   Diagnosis Date Noted  . Nausea vomiting and diarrhea   . PUD (peptic ulcer disease)   . Chronic gastric erosion   . Colon cancer screening   . SBO (small bowel obstruction) ([place]) 12/29/2017  . Intractable nausea and vomiting 12/28/2017  . UTI (urinary tract infection) 12/28/2017  . Chronic obstructive pulmonary disease ([place]) 11/25/2017  . Current smoker 11/25/2017  . Personal history of lung cancer 11/25/2017  . Pre-diabetes 08/30/2017  . Pure hypercholesterolemia 08/30/2017  . Hepatic cirrhosis ([place]) 07/01/2017  . Shortness of breath 06/17/2017  . Paroxysmal atrial fibrillation ([place]) 06/17/2017  . Thrombus of left atrial appendage without antecedent myocardial infarction 06/17/2017  . Incisional hernia, without obstruction or gangrene 05/27/2017  . 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 Thrombus in heart chamber 05/17/2017  . Hyperkalemia   . Acute renal failure ([place])   . CKD (chronic kidney disease) stage  3, GFR 30-59 ml/min ([facility]) 04/09/2017  . Essential hypertension 04/09/2017  . Intermittent vertigo 04/09/2017  . CAD in native artery 02/28/2017  . 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 History of sebaceous cyst 02/28/2017  . Itching 02/28/2017  . 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 Lung nodule 06/25/2016  . Adenocarcinoma of left lung ([place]) 06/19/2016  . Primary cancer of left upper lobe of lung  ([place]) 06/19/2016  . AKI (acute kidney injury) ([place]) 12/29/2015  . 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 ABDOMINAL HYSTERECTOMY    . cardiac bypass  1990  . carpel tunnel    . CHOLECYSTECTOMY    . COLON SURGERY    . COLONOSCOPY    . COLONOSCOPY WITH PROPOFOL N/A 03/03/2018   Procedure: COLONOSCOPY WITH PROPOFOL  ileostomy takedown;  Surgeon: Lin Landsman, MD;  Location: [place];  Service: Gastroenterology;  Laterality: N/A;  . CORONARY ARTERY BYPASS GRAFT  1990's   x 4  . CT guided needle placement    . ESOPHAGOGASTRODUODENOSCOPY (EGD) WITH PROPOFOL  03/03/2018   Procedure: ESOPHAGOGASTRODUODENOSCOPY (EGD) WITH PROPOFOL;  Surgeon: Lin Landsman, MD;  Location: [facility];  Service: Gastroenterology;;  . EXPLORATORY LAPAROTOMY    . GALLBLADDER SURGERY    . loop ileostomy    . SIGMOID RESECTION / RECTOPEXY    . TEE WITHOUT CARDIOVERSION N/A 05/04/2017   Procedure: TRANSESOPHAGEAL ECHOCARDIOGRAM (TEE);  Surgeon: Wellington Hampshire, MD;  Location: [facility] OR[facility];  Service: Cardiovascular;  Laterality: N/A;  . THORACOSCOPY    . vaginectomy      Prior to Admission medications   Medication Sig Start Date End Date Taking? Authorizing Provider  acetaminophen (TYLENOL) 500 MG tablet Take 500 mg by mouth every 6 (six) hours as needed (for pain).     [provider]  albuterol (PROVENTIL HFA;VENTOLIN HFA) 108 (90 Base) MCG/ACT inhaler Inhale 2 puffs into the lungs every 6 (six) hours as needed. 04/13/17 04/13/18  [provider]  BREO ELLIPTA 100-25 MCG/INH AEPB Inhale 1 puff into the lungs 2 (two) times daily.    [provider]  metoprolol succinate (TOPROL-XL) 25 MG 24 hr tablet Take 1 tablet (25 mg total) by mouth daily. 01/04/18 01/04/19  Bettey Costa, MD  omeprazole (PRILOSEC) 20 MG capsule Take 1 capsule (20 mg total) by mouth 2 (two) times daily before a meal. 03/03/18  08/30/18  Vanga, Tally Due, MD  pravastatin (PRAVACHOL) 10 MG tablet Take 10 mg by mouth daily.     [provider]  warfarin (COUMADIN) 5 MG tablet Take 1 tablet (5 mg total) by mouth daily at 6 PM. Take 1 tablet daily 04/14/18   End, Harrell Gave, MD    Allergies Penicillin g and Succinylcholine  Family History  Problem Relation Age of Onset  . Cancer Mother   . Stomach cancer Mother   . Heart disease Father   . Hypertension Father   . Parkinson's disease Sister   . Cervical cancer Sister     Social History Social History   Tobacco Use  . Smoking status: Current Every Day Smoker    Packs/day: 0.50    Years: 40.00    Pack years: 20.00  . Smokeless tobacco: Never Used  . Tobacco comment: denied smoking info  Substance Use Topics  . Alcohol use: No  . Drug use: No    Review of  Systems  Constitutional: Negative for fever. Eyes: Negative for visual changes. ENT: Negative for sore throat. Neck: No neck pain  Cardiovascular: Negative for chest pain. Respiratory: Negative for shortness of breath. Gastrointestinal: + epigastric abdominal pain, nausea, and vomiting. No diarrhea. Genitourinary: Negative for dysuria. Musculoskeletal: Negative for back pain. Skin: Negative for rash. Neurological: Negative for headaches, weakness or numbness. Psych: No SI or HI  ____________________________________________   PHYSICAL EXAM:  VITAL SIGNS: ED Triage Vitals  Enc Vitals Group     BP 04/29/18 1844 (!) 124/105     Pulse Rate 04/29/18 1844 (!) 112     Resp 04/29/18 1844 18     Temp 04/29/18 1844 98.2 F (36.8 C)     Temp Source 04/29/18 1844 Oral     SpO2 04/29/18 1844 95 %     Weight 04/29/18 1846 145 lb (65.8 kg)     Height 04/29/18 1846 5\' 4"  (1.626 m)     Head Circumference --      Peak Flow --      Pain Score 04/29/18 1846 4     Pain Loc --      Pain Edu? --      Excl. in [place]? --     Constitutional: Alert and oriented. Well appearing and in no  apparent distress. HEENT:      Head: Normocephalic and atraumatic.         Eyes: Conjunctivae are normal. Sclera is non-icteric.       Mouth/Throat: Mucous membranes are moist.       Neck: Supple with no signs of meningismus. Cardiovascular: Tachycardic with regular. No murmurs, gallops, or rubs. 2+ symmetrical distal pulses are present in all extremities. No JVD. Respiratory: Normal respiratory effort. Lungs are clear to auscultation bilaterally. No wheezes, crackles, or rhonchi.  Gastrointestinal: Soft, non tender, large reducible ventral hernia, non distended with positive bowel sounds. No rebound or guarding. Genitourinary: No CVA tenderness. Musculoskeletal: Nontender with normal range of motion in all extremities. No edema, cyanosis, or erythema of extremities. Neurologic: Normal speech and language. Face is symmetric. Moving all extremities. No gross focal neurologic deficits are appreciated. Skin: Skin is warm, dry and intact. No rash noted. Psychiatric: Mood and affect are normal. Speech and behavior are normal.  ____________________________________________   LABS (all labs ordered are listed, but only abnormal results are displayed)  Labs Reviewed  CBC WITH DIFFERENTIAL/PLATELET - Abnormal; Notable for the following components:      Result Value   RBC 5.71 (*)    Hemoglobin 17.9 (*)    HCT 51.8 (*)    Neutro Abs 7.8 (*)    All other components within normal limits  COMPREHENSIVE METABOLIC PANEL - Abnormal; Notable for the following components:   Chloride 94 (*)    Glucose, Bld 175 (*)    Creatinine, Ser 1.34 (*)    Total Protein 8.9 (*)    Total Bilirubin 2.4 (*)    GFR calc non Af Amer 40 (*)    GFR calc Af Amer 46 (*)    Anion gap 17 (*)    All other components within normal limits  URINALYSIS, COMPLETE (UACMP) WITH MICROSCOPIC - Abnormal; Notable for the following components:   Color, Urine AMBER (*)    APPearance TURBID (*)    Hgb urine dipstick SMALL (*)     Protein, ur 30 (*)    Leukocytes, UA MODERATE (*)    WBC, UA >50 (*)    Bacteria, UA MANY (*)  Squamous Epithelial / LPF >50 (*)    All other components within normal limits  PROTIME-INR - Abnormal; Notable for the following components:   Prothrombin Time 32.2 (*)    All other components within normal limits  LIPASE, BLOOD  TROPONIN I   ____________________________________________  EKG  ED ECG REPORT I, Rudene Re, the attending physician, personally viewed and interpreted this ECG.  Sinus tachycardia, rate of 101, normal intervals, normal axis, T wave inversions in inferior and lateral leads, no ST elevation.  These changes are not new when compared to prior. ____________________________________________  RADIOLOGY  I have personally reviewed the images performed during this visit and I agree with the Radiologist's read.   Interpretation by Radiologist:  Ct Abdomen Pelvis Wo Contrast  Result Date: 04/29/2018 CLINICAL DATA:  Patient c/o nausea for several months. R sided colostomy present. Patient also has abdominal hernia. EXAM: CT ABDOMEN AND PELVIS WITHOUT CONTRAST TECHNIQUE: Multidetector CT imaging of the abdomen and pelvis was performed following the standard protocol without IV contrast. COMPARISON:  CT of the abdomen and pelvis 04/18/2018 FINDINGS: Lower chest: No acute abnormality. Atherosclerotic calcification of the partially imaged coronary vessels. Hepatobiliary: Nodular contour of the liver and enlarged caudate lobe are consistent with cirrhosis. No focal liver lesion identified on this noncontrast exam. Cholecystectomy. Pancreas: Unremarkable. No pancreatic ductal dilatation or surrounding inflammatory changes. Spleen: Normal in size without focal abnormality. Adrenals/Urinary Tract: Normal adrenal glands. Extensive intrarenal calcifications. There is mild renal parenchymal thinning, LEFT greater than RIGHT. No hydronephrosis. Decompressed urinary bladder.  Stomach/Bowel: The stomach is normal in appearance. RIGHT LOWER QUADRANT loop ileostomy. There is a large parastomal hernia. There is marked dilatation of central abdominal small bowel loops secondary to obstruction within the parastomal hernia. A broad ventral hernia contains nondilated and dilated loops of small bowel but does not appear to be a point of obstruction. There is no free intraperitoneal air. The colon is decompressed. Vascular/Lymphatic: There is extensive atherosclerotic calcification of the abdominal aorta not associated with aneurysm. No retroperitoneal or mesenteric adenopathy. Reproductive: Hysterectomy.  No adnexal mass. Other: No free pelvic fluid. Rod midline ventral hernia is nonobstructing. RIGHT peristomal hernia associated with loop ileostomy causing obstruction of the small bowel. Musculoskeletal: LOWER lumbar spine degenerative changes. IMPRESSION: 1. Small-bowel obstruction secondary to RIGHT LOWER QUADRANT parastomal hernia associated with loop ileostomy. 2. Broad-based ventral hernia not associated with obstruction. 3. Changes in the liver are consistent with cirrhosis. 4. Cholecystectomy. 5. Nephrolithiasis. 6.  Aortic atherosclerosis.  (ICD10-I70.0) 7. Hysterectomy. Electronically Signed   By: Nolon Nations M.D.   On: 04/29/2018 20:32   Dg Abd 2 Views  Result Date: 04/29/2018 CLINICAL DATA:  Nausea and vomiting, right-sided colostomy EXAM: ABDOMEN - 2 VIEW COMPARISON:  04/18/2018 abdominal radiograph FINDINGS: Multiple mildly dilated small bowel loops with air-fluid levels throughout the abdomen measuring up to 3.5 cm diameter. No evidence of pneumatosis or pneumoperitoneum. Clear lung bases. No radiopaque nephrolithiasis. Cholecystectomy clips are seen in the right upper quadrant of the abdomen. Intact visualized lower sternotomy wires. Surgical sutures overlie the deep pelvis. IMPRESSION: Dilated small bowel loops with air-fluid levels throughout the abdomen compatible with  distal small bowel obstruction, probably due to parastomal hernia as seen on 04/18/2018 CT study. A repeat CT abdomen/pelvis with oral and IV contrast may be obtained as clinically warranted. Electronically Signed   By: Ilona Sorrel M.D.   On: 04/29/2018 19:32      ____________________________________________   PROCEDURES  Procedure(s) performed: None Procedures Critical Care  performed:  None ____________________________________________   INITIAL IMPRESSION / ASSESSMENT AND PLAN / ED COURSE   67 y.o. female with a history of ileostomy status post diverticulitis with fistula, left atrial appendage thrombus on Coumadin, atrial fibrillation, CVA, CKD, lung cancer, CAD who presents for evaluation of nausea and vomiting since last night.  This seems to be an ongoing issue for the patient.  Unclear why she does not have Zofran at home.  At this time her abdomen is soft with no tenderness throughout, she has a large ventral hernia that is fully reducible, her vitals show tachycardia but no other acute findings.  Differential diagnosis is broad and includes but not limited to cyclic vomiting versus SBO versus gastritis versus pancreatitis. Plan for labs including CBC, CMP, lipase, UA. WIll give IVF and zofran.    _________________________ 9:01 PM on 04/29/2018 -----------------------------------------   KUB was concerning for possible obstruction therefore patient was sent for CT which confirmed a small bowel obstruction.  Discussed with Dr. Hampton Abbot for admission.  Will place an NG tube.  Labs also concerning for acute on chronic kidney injury most likely due to dehydration.  Patient also had a mild anion gap acidosis.  She was given Zofran and fluids and she feels markedly improved.  She is no longer vomiting. UA positive for UTI will start patient on rocephin. INR is therapeutic.        As part of my medical decision making, I reviewed the following data within the [facility] notes reviewed and incorporated, Labs reviewed , EKG interpreted , Old chart reviewed, Radiograph reviewed , Discussed with admitting physician , Notes from prior ED visits and [place] Controlled Substance Database    Pertinent labs & imaging results that were available during my care of the patient were reviewed by me and considered in my medical decision making (see chart for details).    ____________________________________________   FINAL CLINICAL IMPRESSION(S) / ED DIAGNOSES  Final diagnoses:  Nausea and vomiting  SBO (small bowel obstruction) ([facility])  Acute kidney injury superimposed on chronic kidney disease ([place])  Acute cystitis with hematuria  Dehydration      NEW MEDICATIONS STARTED DURING THIS VISIT:  ED Discharge Orders    None       Note:  This document was prepared using Dragon voice recognition software and may include unintentional dictation errors.    Alfred Levins, [place], MD 04/29/18 2106

## 2018-04-29 NOTE — ED Triage Notes (Signed)
Patient c/o nausea for several months. R sided colostomy present. Patient also has abdominal hernia. A&O x4. Independent. Denies any CP or SOB.

## 2018-04-29 NOTE — H&P (Signed)
Date of Admission:  04/29/2018  Reason for Admission:  Small bowel obstruction  History of Present Illness: Carly Khan is a 67 y.o. female known to our practice due to recurrent small bowel obstruction secondary to a parastomal hernia and adhesions.  She's s/p exploratory laparotomy with Hartmann's procedure, and then subsequent colonic anastomosis with loop ileostomy.  She has developed a parastomal hernia as well as adhesions and has a ventral hernia as well.  Her workup has been in progress and has most recently started Coumadin for atrial thrombus.    Patient reports that yesterday she started having abdominal pain and also nausea and vomiting.  She has been having ostomy output including stool and gas but today has been more liquid without real gas.  She reports that her pain radiates from her right lower quadrant ostomy towards her midline.  Denies any fevers or chills, chest pain or shortness of breath.  Past Medical History: Past Medical History:  Diagnosis Date  . CAD in native artery 1991   a. s/p CABG in 1991 in [place]; b. 06/2017 MV: EF 72%, no ischemia/infarct-->Low risk.  . Cancer of left lung ([place]) 2016   a. 12/2015 LUL lung resection.  . Chronic Dyspnea on exertion   . CKD (chronic kidney disease) stage 3, GFR 30-59 ml/min ([place]) 04/09/2017   a. 04/2017 ARF & Hyperkalemia req HD x 1.  . 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 Diverticulitis   . Diverticulitis   . Dyspnea   . Essential hypertension 04/09/2017  . Family history of adverse reaction to anesthesia   . History of sebaceous cyst 02/28/2017  . History of stress test   . Intermittent vertigo 04/09/2017  . Itching 02/28/2017  . Left Atrial Appendage Thrombus    a. Incidentally noted on CT 04/2017;  b. 04/2017 Echo: EF 65-70%, Gr1 DD, no LA filling defect seen; c. 05/2017 TEE: EF 60-65%, no RA/LA thrombus; d. Later found to have PAF on Event monitor 06/2017-->initially placed on eliquis; changed to coumadin 2/2 $.  . PAF (paroxysmal atrial  fibrillation) ([place])    a. Discovered 06/2017 on Event monitor as part of w/u for LAA thrombus. CHA2DS2VASc = 4-->Eliquis.  . Stroke [facility])    a. Pt reports prior h/o CVA; b. 07/2017 Head CT: Chronic atrophy and small vessel ischemic changes. No acute intracranial abnormalities.     Past Surgical History: Past Surgical History:  Procedure Laterality Date  . ABDOMINAL HYSTERECTOMY    . cardiac bypass  1990  . carpel tunnel    . CHOLECYSTECTOMY    . COLON SURGERY    . COLONOSCOPY    . COLONOSCOPY WITH PROPOFOL N/A 03/03/2018   Procedure: COLONOSCOPY WITH PROPOFOL  ileostomy takedown;  Surgeon: Lin Landsman, MD;  Location: [place];  Service: Gastroenterology;  Laterality: N/A;  . CORONARY ARTERY BYPASS GRAFT  1990's   x 4  . CT guided needle placement    . ESOPHAGOGASTRODUODENOSCOPY (EGD) WITH PROPOFOL  03/03/2018   Procedure: ESOPHAGOGASTRODUODENOSCOPY (EGD) WITH PROPOFOL;  Surgeon: Lin Landsman, MD;  Location: [facility];  Service: Gastroenterology;;  . EXPLORATORY LAPAROTOMY    . GALLBLADDER SURGERY    . loop ileostomy    . SIGMOID RESECTION / RECTOPEXY    . TEE WITHOUT CARDIOVERSION N/A 05/04/2017   Procedure: TRANSESOPHAGEAL ECHOCARDIOGRAM (TEE);  Surgeon: Wellington Hampshire, MD;  Location: [facility];  Service: Cardiovascular;  Laterality: N/A;  . THORACOSCOPY    . vaginectomy      Home Medications: Prior  to Admission medications   Medication Sig Start Date End Date Taking? Authorizing Provider  acetaminophen (TYLENOL) 500 MG tablet Take 500 mg by mouth every 6 (six) hours as needed (for pain).    Yes [provider]  albuterol (PROVENTIL HFA;VENTOLIN HFA) 108 (90 Base) MCG/ACT inhaler Inhale 2 puffs into the lungs every 6 (six) hours as needed for wheezing or shortness of breath.   Yes [provider]  BREO ELLIPTA 100-25 MCG/INH AEPB Inhale 1 puff into the lungs 2 (two) times daily.   Yes [provider]  metoprolol succinate  (TOPROL-XL) 25 MG 24 hr tablet Take 1 tablet (25 mg total) by mouth daily. Patient taking differently: Take 25 mg by mouth at bedtime.  01/04/18 01/04/19 Yes Bettey Costa, MD  omeprazole (PRILOSEC) 20 MG capsule Take 1 capsule (20 mg total) by mouth 2 (two) times daily before a meal. 03/03/18 08/30/18 Yes Vanga, Tally Due, MD  pravastatin (PRAVACHOL) 10 MG tablet Take 10 mg by mouth at bedtime.    Yes [provider]  warfarin (COUMADIN) 5 MG tablet Take 1 tablet (5 mg total) by mouth daily at 6 PM. Take 1 tablet daily Patient taking differently: Take 2.5 mg by mouth daily at 6 PM. Take 1 tablet daily 04/14/18  Yes End, Harrell Gave, MD    Allergies: Allergies  Allergen Reactions  . Penicillin G Hives and Itching    Has patient had a PCN reaction causing immediate rash, facial/tongue/throat swelling, SOB or lightheadedness with hypotension: Yes Has patient had a PCN reaction causing severe rash involving mucus membranes or skin necrosis: No Has patient had a PCN reaction that required hospitalization: No Has patient had a PCN reaction occurring within the last 10 years: Yes If all of the above answers are "NO", then may proceed with Cephalosporin use.  . Succinylcholine     Patient's daughter had emergency surgery and was awake for the entire procedure, was told she should not have succ's and neither should her mom     Social History:  reports that she has been smoking.  She has a 20.00 pack-year smoking history. She has never used smokeless tobacco. She reports that she does not drink alcohol or use drugs.   Family History: Family History  Problem Relation Age of Onset  . Cancer Mother   . Stomach cancer Mother   . Heart disease Father   . Hypertension Father   . Parkinson's disease Sister   . Cervical cancer Sister     Review of Systems: Review of Systems  Constitutional: Negative for chills and fever.  HENT: Negative for hearing loss.   Respiratory: Negative for  shortness of breath.   Cardiovascular: Negative for chest pain.  Gastrointestinal: Positive for abdominal pain, nausea and vomiting. Negative for constipation and diarrhea.  Genitourinary: Negative for dysuria.  Musculoskeletal: Negative for myalgias.  Skin: Negative for rash.  Neurological: Negative for dizziness.  Psychiatric/Behavioral: Negative for depression.  All other systems reviewed and are negative.   Physical Exam BP (!) 137/97   Pulse 95   Temp 98.2 F (36.8 C) (Oral)   Resp (!) 21   Ht 5\' 4"  (1.626 m)   Wt 145 lb (65.8 kg)   SpO2 98%   BMI 24.89 kg/m  CONSTITUTIONAL: No acute distress HEENT:  Normocephalic, atraumatic, extraocular motion intact, poor dentition. NECK: Trachea is midline, and there is no jugular venous distension.  RESPIRATORY:  Lungs are clear, and breath sounds are equal bilaterally. Normal respiratory effort without  pathologic use of accessory muscles. CARDIOVASCULAR: Heart is regular without murmurs, gallops, or rubs. GI: The abdomen is soft, mildly distended, with a parastomal hernia on the right lower quadrant, which is partially reducible, and some tenderness to palpation during reduction attempt.  There is gas inside her ostomy bag  Midline incision well healed with a ventral hernia as well, small and reducible. MUSCULOSKELETAL:  Normal muscle strength and tone in all four extremities.  No peripheral edema or cyanosis. SKIN: Skin turgor is normal. There are no pathologic skin lesions.  NEUROLOGIC:  Motor and sensation is grossly normal.  Cranial nerves are grossly intact. PSYCH:  Alert and oriented to person, place and time. Affect is normal.  Laboratory Analysis: Results for orders placed or performed during the hospital encounter of 04/29/18 (from the past 24 hour(s))  CBC with Differential/Platelet     Status: Abnormal   Collection Time: 04/29/18  6:57 PM  Result Value Ref Range   WBC 9.3 3.6 - 11.0 K/uL   RBC 5.71 (H) 3.80 - 5.20 MIL/uL    Hemoglobin 17.9 (H) 12.0 - 16.0 g/dL   HCT 51.8 (H) 35.0 - 47.0 %   MCV 90.7 80.0 - 100.0 fL   MCH 31.3 26.0 - 34.0 pg   MCHC 34.5 32.0 - 36.0 g/dL   RDW 13.2 11.5 - 14.5 %   Platelets 300 150 - 440 K/uL   Neutrophils Relative % 84 %   Neutro Abs 7.8 (H) 1.4 - 6.5 K/uL   Lymphocytes Relative 10 %   Lymphs Abs 1.0 1.0 - 3.6 K/uL   Monocytes Relative 6 %   Monocytes Absolute 0.5 0.2 - 0.9 K/uL   Eosinophils Relative 0 %   Eosinophils Absolute 0.0 0 - 0.7 K/uL   Basophils Relative 0 %   Basophils Absolute 0.0 0 - 0.1 K/uL  Comprehensive metabolic panel     Status: Abnormal   Collection Time: 04/29/18  6:57 PM  Result Value Ref Range   Sodium 135 135 - 145 mmol/L   Potassium 4.2 3.5 - 5.1 mmol/L   Chloride 94 (L) 98 - 111 mmol/L   CO2 24 22 - 32 mmol/L   Glucose, Bld 175 (H) 70 - 99 mg/dL   BUN 22 8 - 23 mg/dL   Creatinine, Ser 1.34 (H) 0.44 - 1.00 mg/dL   Calcium 9.5 8.9 - 10.3 mg/dL   Total Protein 8.9 (H) 6.5 - 8.1 g/dL   Albumin 4.4 3.5 - 5.0 g/dL   AST 28 15 - 41 U/L   ALT 9 0 - 44 U/L   Alkaline Phosphatase 87 38 - 126 U/L   Total Bilirubin 2.4 (H) 0.3 - 1.2 mg/dL   GFR calc non Af Amer 40 (L) >60 mL/min   GFR calc Af Amer 46 (L) >60 mL/min   Anion gap 17 (H) 5 - 15  Lipase, blood     Status: None   Collection Time: 04/29/18  6:57 PM  Result Value Ref Range   Lipase 28 11 - 51 U/L  Urinalysis, Complete w Microscopic     Status: Abnormal   Collection Time: 04/29/18  6:57 PM  Result Value Ref Range   Color, Urine AMBER (A) YELLOW   APPearance TURBID (A) CLEAR   Specific Gravity, Urine 1.021 1.005 - 1.030   pH 5.0 5.0 - 8.0   Glucose, UA NEGATIVE NEGATIVE mg/dL   Hgb urine dipstick SMALL (A) NEGATIVE   Bilirubin Urine NEGATIVE NEGATIVE   Ketones, ur NEGATIVE  NEGATIVE mg/dL   Protein, ur 30 (A) NEGATIVE mg/dL   Nitrite NEGATIVE NEGATIVE   Leukocytes, UA MODERATE (A) NEGATIVE   RBC / HPF 21-50 0 - 5 RBC/hpf   WBC, UA >50 (H) 0 - 5 WBC/hpf   Bacteria, UA MANY (A)  NONE SEEN   Squamous Epithelial / LPF >50 (H) 0 - 5   WBC Clumps PRESENT    Mucus PRESENT    Hyaline Casts, UA PRESENT   Protime-INR     Status: Abnormal   Collection Time: 04/29/18  6:57 PM  Result Value Ref Range   Prothrombin Time 32.2 (H) 11.4 - 15.2 seconds   INR 3.16     Imaging: Ct Abdomen Pelvis Wo Contrast  Result Date: 04/29/2018 CLINICAL DATA:  Patient c/o nausea for several months. R sided colostomy present. Patient also has abdominal hernia. EXAM: CT ABDOMEN AND PELVIS WITHOUT CONTRAST TECHNIQUE: Multidetector CT imaging of the abdomen and pelvis was performed following the standard protocol without IV contrast. COMPARISON:  CT of the abdomen and pelvis 04/18/2018 FINDINGS: Lower chest: No acute abnormality. Atherosclerotic calcification of the partially imaged coronary vessels. Hepatobiliary: Nodular contour of the liver and enlarged caudate lobe are consistent with cirrhosis. No focal liver lesion identified on this noncontrast exam. Cholecystectomy. Pancreas: Unremarkable. No pancreatic ductal dilatation or surrounding inflammatory changes. Spleen: Normal in size without focal abnormality. Adrenals/Urinary Tract: Normal adrenal glands. Extensive intrarenal calcifications. There is mild renal parenchymal thinning, LEFT greater than RIGHT. No hydronephrosis. Decompressed urinary bladder. Stomach/Bowel: The stomach is normal in appearance. RIGHT LOWER QUADRANT loop ileostomy. There is a large parastomal hernia. There is marked dilatation of central abdominal small bowel loops secondary to obstruction within the parastomal hernia. A broad ventral hernia contains nondilated and dilated loops of small bowel but does not appear to be a point of obstruction. There is no free intraperitoneal air. The colon is decompressed. Vascular/Lymphatic: There is extensive atherosclerotic calcification of the abdominal aorta not associated with aneurysm. No retroperitoneal or mesenteric adenopathy.  Reproductive: Hysterectomy.  No adnexal mass. Other: No free pelvic fluid. Rod midline ventral hernia is nonobstructing. RIGHT peristomal hernia associated with loop ileostomy causing obstruction of the small bowel. Musculoskeletal: LOWER lumbar spine degenerative changes. IMPRESSION: 1. Small-bowel obstruction secondary to RIGHT LOWER QUADRANT parastomal hernia associated with loop ileostomy. 2. Broad-based ventral hernia not associated with obstruction. 3. Changes in the liver are consistent with cirrhosis. 4. Cholecystectomy. 5. Nephrolithiasis. 6.  Aortic atherosclerosis.  (ICD10-I70.0) 7. Hysterectomy. Electronically Signed   By: Nolon Nations M.D.   On: 04/29/2018 20:32   Dg Abd 2 Views  Result Date: 04/29/2018 CLINICAL DATA:  Nausea and vomiting, right-sided colostomy EXAM: ABDOMEN - 2 VIEW COMPARISON:  04/18/2018 abdominal radiograph FINDINGS: Multiple mildly dilated small bowel loops with air-fluid levels throughout the abdomen measuring up to 3.5 cm diameter. No evidence of pneumatosis or pneumoperitoneum. Clear lung bases. No radiopaque nephrolithiasis. Cholecystectomy clips are seen in the right upper quadrant of the abdomen. Intact visualized lower sternotomy wires. Surgical sutures overlie the deep pelvis. IMPRESSION: Dilated small bowel loops with air-fluid levels throughout the abdomen compatible with distal small bowel obstruction, probably due to parastomal hernia as seen on 04/18/2018 CT study. A repeat CT abdomen/pelvis with oral and IV contrast may be obtained as clinically warranted. Electronically Signed   By: Ilona Sorrel M.D.   On: 04/29/2018 19:32   Dg Abd Portable 1 View  Result Date: 04/29/2018 CLINICAL DATA:  NG tube placement EXAM: PORTABLE ABDOMEN -  1 VIEW COMPARISON:  None. FINDINGS: NG tube side port is below the gastroesophageal junction. Tip projects below the field of view. Lung bases are clear. Remote median sternotomy. IMPRESSION: NG tube side port in the proximal  stomach. Electronically Signed   By: Ulyses Jarred M.D.   On: 04/29/2018 22:18    Assessment and Plan: This is a 67 y.o. female who presents with small bowel obstruction.  I have independently viewed the patient's imaging studies and reviewed her laboratory studies.  Overall, her CT scan does show a small bowel obstruction with dilated loops of small bowel leading to an area of adhesions at the mouth of her parastomal hernia.  She also has a midline ventral hernia that is non-obstructing.  Her labs are overall unremarkable with a normal WBC.  Discussed with the patient that she would be admitted to the surgical team.  She will be NPO with IV fluid hydration, and appropriate nausea and pain control.  She will get an NG tube for decompression and conservative management.  At this point, her INR remains elevated and will not have any prophylactic medications until her INR < 2.  Discussed with the patient that all the necessary teams have seen her in the outpatient setting and we should be able to schedule a preop visit with me to discuss surgery as an outpatient in about two weeks, to allow her to improve from this episode, with plans for surgery in about 3 weeks.  When her INR is < 2, she would need active anticoagulation in the form of Heparin gtt, which was also given to her on her last admission.  Patient understands this plan and all of her questions have been answered.   Melvyn Neth, [facility]

## 2018-04-29 NOTE — ED Notes (Signed)
Pt resting comfortably

## 2018-04-29 NOTE — ED Notes (Signed)
ED Provider at bedside. 

## 2018-04-30 DIAGNOSIS — K5652 Intestinal adhesions [bands] with complete obstruction: Secondary | ICD-10-CM

## 2018-04-30 LAB — CBC WITH DIFFERENTIAL/PLATELET
BASOS ABS: 0 10*3/uL (ref 0–0.1)
Basophils Relative: 0 %
Eosinophils Absolute: 0 10*3/uL (ref 0–0.7)
Eosinophils Relative: 1 %
HCT: 45.2 % (ref 35.0–47.0)
HEMOGLOBIN: 15.6 g/dL (ref 12.0–16.0)
LYMPHS ABS: 1.2 10*3/uL (ref 1.0–3.6)
LYMPHS PCT: 17 %
MCH: 31.2 pg (ref 26.0–34.0)
MCHC: 34.4 g/dL (ref 32.0–36.0)
MCV: 90.7 fL (ref 80.0–100.0)
Monocytes Absolute: 0.6 10*3/uL (ref 0.2–0.9)
Monocytes Relative: 9 %
NEUTROS PCT: 73 %
Neutro Abs: 4.9 10*3/uL (ref 1.4–6.5)
PLATELETS: 256 10*3/uL (ref 150–440)
RBC: 4.99 MIL/uL (ref 3.80–5.20)
RDW: 13 % (ref 11.5–14.5)
WBC: 6.7 10*3/uL (ref 3.6–11.0)

## 2018-04-30 LAB — BASIC METABOLIC PANEL
Anion gap: 11 (ref 5–15)
BUN: 28 mg/dL — ABNORMAL HIGH (ref 8–23)
CALCIUM: 8.6 mg/dL — AB (ref 8.9–10.3)
CHLORIDE: 100 mmol/L (ref 98–111)
CO2: 28 mmol/L (ref 22–32)
CREATININE: 1.44 mg/dL — AB (ref 0.44–1.00)
GFR calc non Af Amer: 36 mL/min — ABNORMAL LOW (ref >60)
GFR, EST AFRICAN AMERICAN: 42 mL/min — AB (ref >60)
Glucose, Bld: 126 mg/dL — ABNORMAL HIGH (ref 70–99)
Potassium: 3.6 mmol/L (ref 3.5–5.1)
SODIUM: 139 mmol/L (ref 135–145)

## 2018-04-30 LAB — PROTIME-INR
INR: 3.7
Prothrombin Time: 36.4 seconds — ABNORMAL HIGH (ref 11.4–15.2)

## 2018-04-30 LAB — MAGNESIUM: MAGNESIUM: 2.1 mg/dL (ref 1.7–2.4)

## 2018-04-30 NOTE — Progress Notes (Signed)
SURGICAL PROGRESS NOTE (cpt [phone])  Hospital Day(s): 1.   Post op day(s):  Carly Khan   Interval History: Patient seen and examined, no acute events or new complaints since admission overnight. Patient reports mild peri-ileostomy pain with slowly resuming ileostomy function (gas and liquid both), denies N/V, fever/chills, CP, or SOB, though NG tube drainage overnight was >500 mL. Patient states she hopes to recover from her obstruction and follow-up with Dr. Hampton Abbot to schedule ileostomy reversal.  Review of Systems:  Constitutional: denies fever, chills  HEENT: denies cough or congestion  Respiratory: denies any shortness of breath  Cardiovascular: denies chest pain or palpitations  Gastrointestinal: abdominal pain, N/V, and bowel function as per interval history Genitourinary: denies burning with urination or urinary frequency Musculoskeletal: denies pain, decreased motor or sensation Integumentary: denies any other rashes or skin discolorations Neurological: denies HA or vision/hearing changes   Vital signs in last 24 hours: [min-max] current  Temp:  [97.5 F (36.4 C)-98.2 F (36.8 C)] 97.9 F (36.6 C) (06/28 0502) Pulse Rate:  [80-112] 80 (06/28 0502) Resp:  [12-23] 16 (06/28 0502) BP: (124-171)/(70-105) 146/88 (06/28 0502) SpO2:  [95 %-99 %] 95 % (06/28 0502) Weight:  [133 lb 8 oz (60.6 kg)-145 lb (65.8 kg)] 133 lb 8 oz (60.6 kg) (06/27 2259)     Height: 5\' 4"  (162.6 cm) Weight: 133 lb 8 oz (60.6 kg) BMI (Calculated): 22.9   Intake/Output this shift:  No intake/output data recorded.   Intake/Output last 2 shifts:  @IOLAST2SHIFTS @   Physical Exam:  Constitutional: alert, cooperative and no distress  HENT: normocephalic without obvious abnormality  Eyes: PERRL, EOM's grossly intact and symmetric  Neuro: CN II - XII grossly intact and symmetric without deficit  Respiratory: breathing non-labored at rest  Cardiovascular: regular rate and sinus rhythm  Gastrointestinal: soft,  non-tender, and non-distended with no guarding or rebound tenderness, viable functional loop ileostomy with ileostomy bag filled with gas and yellow liquid fecal material, known parastomal hernia Musculoskeletal: UE and LE FROM, no edema or wounds, motor and sensation grossly intact, NT   Labs:  CBC Latest Ref Rng & Units 04/30/2018 04/29/2018 04/19/2018  WBC 3.6 - 11.0 K/uL 6.7 9.3 6.0  Hemoglobin 12.0 - 16.0 g/dL 15.6 17.9(H) 13.8  Hematocrit 35.0 - 47.0 % 45.2 51.8(H) 39.6  Platelets 150 - 440 K/uL 256 300 169   CMP Latest Ref Rng & Units 04/30/2018 04/29/2018 04/19/2018  Glucose 70 - 99 mg/dL 126(H) 175(H) 128(H)  BUN 8 - 23 mg/dL 28(H) 22 6  Creatinine 0.44 - 1.00 mg/dL 1.44(H) 1.34(H) 1.04(H)  Sodium 135 - 145 mmol/L 139 135 137  Potassium 3.5 - 5.1 mmol/L 3.6 4.2 3.4(L)  Chloride 98 - 111 mmol/L 100 94(L) 106  CO2 22 - 32 mmol/L 28 24 23   Calcium 8.9 - 10.3 mg/dL 8.6(L) 9.5 8.4(L)  Total Protein 6.5 - 8.1 g/dL - 8.9(H) -  Total Bilirubin 0.3 - 1.2 mg/dL - 2.4(H) -  Alkaline Phos 38 - 126 U/L - 87 -  AST 15 - 41 U/L - 28 -  ALT 0 - 44 U/L - 9 -   Imaging studies: No new pertinent imaging studies   Assessment/Plan: (ICD-10's: K7.52) 67 y.o. 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  - NPO for now, IV fluids             - NG tube for nasogastric decompression  - start therapeutic heparin  infusion when INR < 2             - monitor ongoing bowel function and abdominal exam             - anticipate symptomatic relief within 24 - 48 hours following NGT insertion, followed by "rumbling" the following day and flatus either the same day or the day following the "rumbling" with anticipated length of stay ~3 - 5 days with successful non-operative management for 8 of 10 patients with small bowel obstruction attributed to post-surgical adhesions             - outpatient surgical follow-up with Dr. Hampton Abbot to schedule ileostomy reversal when discharged  - surgical intervention if doesn't improve was also discussed             - medical management comorbidities per medical team             - ambulation and smoking cessation encouraged  All of the above findings and recommendations were discussed with the patient and patient's family, and all of patient's and family's questions were answered to their expressed satisfaction.  -- Marilynne Drivers Rosana Hoes, MD, [place]: [facility] - Partnering for exceptional care. Office: [phone]

## 2018-05-01 ENCOUNTER — Inpatient Hospital Stay: Payer: Medicare Other

## 2018-05-01 LAB — BASIC METABOLIC PANEL
Anion gap: 8 (ref 5–15)
BUN: 26 mg/dL — AB (ref 8–23)
CALCIUM: 8.1 mg/dL — AB (ref 8.9–10.3)
CHLORIDE: 107 mmol/L (ref 98–111)
CO2: 26 mmol/L (ref 22–32)
Creatinine, Ser: 0.95 mg/dL (ref 0.44–1.00)
GFR calc non Af Amer: >60 mL/min (ref >60)
Glucose, Bld: 83 mg/dL (ref 70–99)
Potassium: 3.3 mmol/L — ABNORMAL LOW (ref 3.5–5.1)
SODIUM: 141 mmol/L (ref 135–145)

## 2018-05-01 LAB — PROTIME-INR
INR: 3.06
Prothrombin Time: 31.4 seconds — ABNORMAL HIGH (ref 11.4–15.2)

## 2018-05-01 MED ORDER — HYDRALAZINE HCL 20 MG/ML IJ SOLN
10.0000 mg | Freq: Four times a day (QID) | INTRAMUSCULAR | Status: DC | PRN
  Administered 2018-05-01 – 2018-05-03 (×3): 10 mg via INTRAVENOUS
  Filled 2018-05-01 (×4): qty 1

## 2018-05-01 MED ORDER — KCL IN DEXTROSE-NACL 20-5-0.45 MEQ/L-%-% IV SOLN
INTRAVENOUS | Status: DC
  Administered 2018-05-01 – 2018-05-03 (×3): via INTRAVENOUS
  Filled 2018-05-01 (×10): qty 1000

## 2018-05-01 MED ORDER — MENTHOL 3 MG MT LOZG
1.0000 | LOZENGE | OROMUCOSAL | Status: DC | PRN
  Administered 2018-05-01 – 2018-05-02 (×3): 3 mg via ORAL
  Filled 2018-05-01: qty 9

## 2018-05-01 MED ORDER — HYDRALAZINE HCL 20 MG/ML IJ SOLN: 10.0000 mg | Freq: Once | INTRAMUSCULAR | Status: DC

## 2018-05-01 NOTE — Progress Notes (Signed)
SURGICAL PROGRESS NOTE (cpt [id number])  Hospital Day(s): 2.   Post op day(s):  Marland Kitchen   Interval History: Patient seen and examined, no acute events or new complaints overnight. Patient reports her ileostomy has resumed functioning WNL, and her NG tube drainage has decreased substantially, but patient still reports "occasional" lower abdominal pain away from her ileostomy (~midline) and mild transient nausea associated with her pain that resolves spontaneously without intervention/medication. She otherwise denies any fever/chills, CP, or SOB and says she feels hungry.  Review of Systems:  Constitutional: denies fever, chills  HEENT: denies cough or congestion  Respiratory: denies any shortness of breath  Cardiovascular: denies chest pain or palpitations  Gastrointestinal: abdominal pain, N/V, and bowel function as per interval history Genitourinary: denies burning with urination or urinary frequency Musculoskeletal: denies pain, decreased motor or sensation Integumentary: denies any other rashes or skin discolorations Neurological: denies HA or vision/hearing changes   Vital signs in last 24 hours: [min-max] current  Temp:  [98 F (36.7 C)-98.2 F (36.8 C)] 98.1 F (36.7 C) (06/29 0526) Pulse Rate:  [50-65] 50 (06/29 0526) Resp:  [16-20] 20 (06/29 0526) BP: (148-182)/(68-88) 149/68 (06/29 0526) SpO2:  [94 %-97 %] 94 % (06/29 0526)     Height: 5\' 4"  (162.6 cm) Weight: 133 lb 8 oz (60.6 kg) BMI (Calculated): 22.9   Intake/Output this shift:  No intake/output data recorded.   Intake/Output last 2 shifts:  @IOLAST2SHIFTS @   Physical Exam:  Constitutional: alert, cooperative and no distress  HENT: normocephalic without obvious abnormality  Eyes: PERRL, EOM's grossly intact and symmetric  Neuro: CN II - XII grossly intact and symmetric without deficit  Respiratory: breathing non-labored at rest  Cardiovascular: regular rate and sinus rhythm  Gastrointestinal: soft and non-distended  with minimal suprapubic tenderness to palpation, pink healthy viable and functional RLQ ileostomy with ostomy bag nearly filled with enteric fluid and gas Musculoskeletal: UE and LE FROM, no edema or wounds, motor and sensation grossly intact, NT   Labs:  CBC Latest Ref Rng & Units 04/30/2018 04/29/2018 04/19/2018  WBC 3.6 - 11.0 K/uL 6.7 9.3 6.0  Hemoglobin 12.0 - 16.0 g/dL 15.6 17.9(H) 13.8  Hematocrit 35.0 - 47.0 % 45.2 51.8(H) 39.6  Platelets 150 - 440 K/uL 256 300 169   CMP Latest Ref Rng & Units 05/01/2018 04/30/2018 04/29/2018  Glucose 70 - 99 mg/dL 83 126(H) 175(H)  BUN 8 - 23 mg/dL 26(H) 28(H) 22  Creatinine 0.44 - 1.00 mg/dL 0.95 1.44(H) 1.34(H)  Sodium 135 - 145 mmol/L 141 139 135  Potassium 3.5 - 5.1 mmol/L 3.3(L) 3.6 4.2  Chloride 98 - 111 mmol/L 107 100 94(L)  CO2 22 - 32 mmol/L 26 28 24   Calcium 8.9 - 10.3 mg/dL 8.1(L) 8.6(L) 9.5  Total Protein 6.5 - 8.1 g/dL - - 8.9(H)  Total Bilirubin 0.3 - 1.2 mg/dL - - 2.4(H)  Alkaline Phos 38 - 126 U/L - - 87  AST 15 - 41 U/L - - 28  ALT 0 - 44 U/L - - 9   INR (05/01/2018): 3.06 (from 3.70 yesterday)  Imaging studies: No new pertinent imaging studies   Assessment/Plan: (ICD-10's: K56.52) 67 y.o. 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  - NPO for now, IV fluids             - start therapeutic heparin infusion when INR <  2 - monitor ongoing bowel function and abdominal exam - continue NG tube for nasogastric decompression for now pending abdominal x-ray  - will check abdominal x-ray to reconcile patient's bowel function + decreased NG tube drainage with occasional mild transient lower abdominal pain and nausea of uncertain etiology  - if abdominal x-ray supports resolution of SBO, will remove NG tube and start clear liquids; otherwise, will continue NG tube until tomorrow - anticipate symptomatic relief within 24 - 48 hours following NGT insertion, followed by "rumbling" the following day and flatus either the same day or the day following the "rumbling" with anticipated length of stay ~3 - 5 days with successful non-operative management for 8 of 10 patients with small bowel obstruction attributed to post-surgical adhesions - outpatient surgical follow-up with Dr. Hampton Abbot to schedule ileostomy reversal when discharged             - surgical intervention if doesn't improve was also discussed - medical management comorbidities per medical team - ambulation and smoking cessation encouraged  All of the above findings and recommendations were discussed with the patient, patient's family, and her RN, and all of patient's and family's questions were answered to their expressed satisfaction.  -- Marilynne Drivers Rosana Hoes, MD, [place]: [facility] - Partnering for exceptional care. Office: [phone]

## 2018-05-02 ENCOUNTER — Inpatient Hospital Stay: Payer: Medicare Other

## 2018-05-02 DIAGNOSIS — K5652 Intestinal adhesions [bands] with complete obstruction: Secondary | ICD-10-CM

## 2018-05-02 LAB — PROTIME-INR
INR: 2.67
Prothrombin Time: 28.2 seconds — ABNORMAL HIGH (ref 11.4–15.2)

## 2018-05-02 NOTE — Progress Notes (Signed)
Patient ID: Carly Khan, female   DOB: January 17, 1951, 67 y.o.   MRN: [id number]     [place] Hospital Day(s): 3.   Post op day(s):  Marland Kitchen   Interval History: Patient seen and examined, no acute events or new complaints overnight. Patient reports passed gas and stool per ostomy, denies nausea and vomiting. .  Vital signs in last 24 hours: [min-max] current  Temp:  [97.7 F (36.5 C)-98.2 F (36.8 C)] 97.7 F (36.5 C) (06/30 0417) Pulse Rate:  [50-67] 53 (06/30 0417) Resp:  [16-20] 16 (06/30 0417) BP: (157-176)/(70-87) 158/82 (06/30 0417) SpO2:  [96 %-97 %] 97 % (06/30 0417)     Height: 5\' 4"  (162.6 cm) Weight: 60.6 kg (133 lb 8 oz) BMI (Calculated): 22.9   NGT: 59mL in last 24 hours  Physical Exam:  Constitutional: alert, cooperative and no distress  Respiratory: breathing non-labored at rest  Cardiovascular: regular rate and sinus rhythm  Gastrointestinal: soft, non-tender, and non-distended. Ostomy pink with watery stool on bag. No air during may evaluation.   Labs:  CBC Latest Ref Rng & Units 04/30/2018 04/29/2018 04/19/2018  WBC 3.6 - 11.0 K/uL 6.7 9.3 6.0  Hemoglobin 12.0 - 16.0 g/dL 15.6 17.9(H) 13.8  Hematocrit 35.0 - 47.0 % 45.2 51.8(H) 39.6  Platelets 150 - 440 K/uL 256 300 169   CMP Latest Ref Rng & Units 05/01/2018 04/30/2018 04/29/2018  Glucose 70 - 99 mg/dL 83 126(H) 175(H)  BUN 8 - 23 mg/dL 26(H) 28(H) 22  Creatinine 0.44 - 1.00 mg/dL 0.95 1.44(H) 1.34(H)  Sodium 135 - 145 mmol/L 141 139 135  Potassium 3.5 - 5.1 mmol/L 3.3(L) 3.6 4.2  Chloride 98 - 111 mmol/L 107 100 94(L)  CO2 22 - 32 mmol/L 26 28 24   Calcium 8.9 - 10.3 mg/dL 8.1(L) 8.6(L) 9.5  Total Protein 6.5 - 8.1 g/dL - - 8.9(H)  Total Bilirubin 0.3 - 1.2 mg/dL - - 2.4(H)  Alkaline Phos 38 - 126 U/L - - 87  AST 15 - 41 U/L - - 28  ALT 0 - 44 U/L - - 9   Imaging studies: No new pertinent imaging studies  INR: 2.67 today  Assessment/Plan:  67 y.o. female with small bowel obstruction. Patient  progressing well with conservative management. Last Xray shows persistent bowel obstruction with dilated small bowel. Will repeat xray for assessment and correlate with clinical improvement. If xray improved, will remove NGT and start clear liquid diet. INR still over 2, will keep DVT prophylaxis on hold.   Arnold Long, MD

## 2018-05-02 NOTE — Progress Notes (Signed)
Patient ID: Carly Khan, female   DOB: July 03, 1951, 67 y.o.   MRN: [id number]  Abdominal xray show resolution of small bowel obstructive pattern. Patient referred passed flatus and having ostomy output. Will order NGT removal and start clear liquid diet.

## 2018-05-03 LAB — PROTIME-INR
INR: 2.24
Prothrombin Time: 24.6 seconds — ABNORMAL HIGH (ref 11.4–15.2)

## 2018-05-03 NOTE — Care Management Important Message (Signed)
Copy of signed IM left with patient in room.  

## 2018-05-03 NOTE — Progress Notes (Signed)
Patient ID: Carly Khan, female   DOB: 02/25/1951, 67 y.o.   MRN: [id number]     [place] Hospital Day(s): 4.   Post op day(s):  Marland Kitchen   Interval History: Patient seen and examined, no acute events or new complaints overnight. Patient reports feeling well and tolerating the clear liquids. Denies nausea or vomiting.  Vital signs in last 24 hours: [min-max] current  Temp:  [97.7 F (36.5 C)-98.4 F (36.9 C)] 98.2 F (36.8 C) (07/01 0554) Pulse Rate:  [56-110] 56 (07/01 0603) Resp:  [18] 18 (07/01 0554) BP: (126-187)/(66-124) 146/66 (07/01 0554) SpO2:  [99 %-100 %] 99 % (07/01 0603)     Height: 5\' 4"  (162.6 cm) Weight: 60.6 kg (133 lb 8 oz) BMI (Calculated): 22.9   Physical Exam:  Constitutional: alert, cooperative and no distress   Respiratory: breathing non-labored at rest  Cardiovascular: regular rate and sinus rhythm  Gastrointestinal: soft, non-tender, and non-distended. Ostomy pink and patent  Labs:  CBC Latest Ref Rng & Units 04/30/2018 04/29/2018 04/19/2018  WBC 3.6 - 11.0 K/uL 6.7 9.3 6.0  Hemoglobin 12.0 - 16.0 g/dL 15.6 17.9(H) 13.8  Hematocrit 35.0 - 47.0 % 45.2 51.8(H) 39.6  Platelets 150 - 440 K/uL 256 300 169   CMP Latest Ref Rng & Units 05/01/2018 04/30/2018 04/29/2018  Glucose 70 - 99 mg/dL 83 126(H) 175(H)  BUN 8 - 23 mg/dL 26(H) 28(H) 22  Creatinine 0.44 - 1.00 mg/dL 0.95 1.44(H) 1.34(H)  Sodium 135 - 145 mmol/L 141 139 135  Potassium 3.5 - 5.1 mmol/L 3.3(L) 3.6 4.2  Chloride 98 - 111 mmol/L 107 100 94(L)  CO2 22 - 32 mmol/L 26 28 24   Calcium 8.9 - 10.3 mg/dL 8.1(L) 8.6(L) 9.5  Total Protein 6.5 - 8.1 g/dL - - 8.9(H)  Total Bilirubin 0.3 - 1.2 mg/dL - - 2.4(H)  Alkaline Phos 38 - 126 U/L - - 87  AST 15 - 41 U/L - - 28  ALT 0 - 44 U/L - - 9     Imaging studies: No new pertinent imaging studies   Assessment/Plan:  67 y.o. female with small bowel obstruction due to adhesion and parastomal hernia. Obstruction resolving with conservative  management. Patient tolerated clear liquid. Will progress to full liquids. Will repeat INR today.   Arnold Long, MD

## 2018-05-04 ENCOUNTER — Telehealth: Payer: Self-pay | Admitting: Internal Medicine

## 2018-05-04 DIAGNOSIS — K560 Paralytic ileus: Secondary | ICD-10-CM

## 2018-05-04 LAB — CBC
HEMATOCRIT: 43.5 % (ref 35.0–47.0)
Hemoglobin: 14.8 g/dL (ref 12.0–16.0)
MCH: 31.4 pg (ref 26.0–34.0)
MCHC: 34.1 g/dL (ref 32.0–36.0)
MCV: 92 fL (ref 80.0–100.0)
PLATELETS: 197 10*3/uL (ref 150–440)
RBC: 4.73 MIL/uL (ref 3.80–5.20)
RDW: 12.7 % (ref 11.5–14.5)
WBC: 6.5 10*3/uL (ref 3.6–11.0)

## 2018-05-04 LAB — BASIC METABOLIC PANEL
Anion gap: 9 (ref 5–15)
CO2: 26 mmol/L (ref 22–32)
CREATININE: 0.9 mg/dL (ref 0.44–1.00)
Calcium: 8.7 mg/dL — ABNORMAL LOW (ref 8.9–10.3)
Chloride: 106 mmol/L (ref 98–111)
Glucose, Bld: 100 mg/dL — ABNORMAL HIGH (ref 70–99)
POTASSIUM: 4 mmol/L (ref 3.5–5.1)
SODIUM: 141 mmol/L (ref 135–145)

## 2018-05-04 MED ORDER — WARFARIN - PHARMACIST DOSING INPATIENT: Freq: Every day | Status: DC

## 2018-05-04 MED ORDER — WARFARIN SODIUM 2 MG PO TABS
2.0000 mg | ORAL_TABLET | Freq: Once | ORAL | Status: AC
  Administered 2018-05-04: 2 mg via ORAL
  Filled 2018-05-04: qty 1

## 2018-05-04 NOTE — Progress Notes (Signed)
Pt now not feeling well nauseated and w some emesis. We will hold DC

## 2018-05-04 NOTE — Telephone Encounter (Signed)
Called pt to confirm appt 05/05/18 for Coumadin and Dr. Harrell Gave End. Pt states she is in the hospital and will be discharged today. Pt request phone call tomorrow to discuss new medication regimen and new appts needed.

## 2018-05-04 NOTE — Progress Notes (Signed)
ANTICOAGULATION CONSULT NOTE - Initial Consult  Pharmacy Consult for warfarin dosing  Indication: atrial fibrillation  Allergies  Allergen Reactions  . Penicillin G Hives and Itching    Has patient had a PCN reaction causing immediate rash, facial/tongue/throat swelling, SOB or lightheadedness with hypotension: Yes Has patient had a PCN reaction causing severe rash involving mucus membranes or skin necrosis: No Has patient had a PCN reaction that required hospitalization: No Has patient had a PCN reaction occurring within the last 10 years: Yes If all of the above answers are "NO", then may proceed with Cephalosporin use.  . Succinylcholine     Patient's daughter had emergency surgery and was awake for the entire procedure, was told she should not have succ's and neither should her mom     Patient Measurements: Height: 5\' 4"  (162.6 cm) Weight: 133 lb 8 oz (60.6 kg) IBW/kg (Calculated) : 54.7  Vital Signs: Temp: 98.3 F (36.8 C) (07/02 0450) Temp Source: Oral (07/02 0450) BP: 161/63 (07/02 0450) Pulse Rate: 68 (07/02 0450)  Labs: Recent Labs    05/02/18 0706 05/03/18 0741 05/04/18 0651  HGB  --   --  14.8  HCT  --   --  43.5  PLT  --   --  197  LABPROT 28.2* 24.6*  --   INR 2.67 2.24  --   CREATININE  --   --  0.90    Estimated Creatinine Clearance: 51.7 mL/min (by C-G formula based on SCr of 0.9 mg/dL).   Medical History: Past Medical History:  Diagnosis Date  . CAD in native artery 1991   a. s/p CABG in 1991 in [place]; b. 06/2017 MV: EF 72%, no ischemia/infarct-->Low risk.  . Cancer of left lung ([place]) 2016   a. 12/2015 LUL lung resection.  . Chronic Dyspnea on exertion   . CKD (chronic kidney disease) stage 3, GFR 30-59 ml/min ([place]) 04/09/2017   a. 04/2017 ARF & Hyperkalemia req HD x 1.  . 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 Diverticulitis   . Diverticulitis   . Dyspnea   . Essential hypertension 04/09/2017  . Family history of adverse reaction to anesthesia   .  History of sebaceous cyst 02/28/2017  . History of stress test   . Intermittent vertigo 04/09/2017  . Itching 02/28/2017  . Left Atrial Appendage Thrombus    a. Incidentally noted on CT 04/2017;  b. 04/2017 Echo: EF 65-70%, Gr1 DD, no LA filling defect seen; c. 05/2017 TEE: EF 60-65%, no RA/LA thrombus; d. Later found to have PAF on Event monitor 06/2017-->initially placed on eliquis; changed to coumadin 2/2 $.  . PAF (paroxysmal atrial fibrillation) ([place])    a. Discovered 06/2017 on Event monitor as part of w/u for LAA thrombus. CHA2DS2VASc = 4-->Eliquis.  . Stroke [facility])    a. Pt reports prior h/o CVA; b. 07/2017 Head CT: Chronic atrophy and small vessel ischemic changes. No acute intracranial abnormalities.    Medications:  Medications Prior to Admission  Medication Sig Dispense Refill Last Dose  . acetaminophen (TYLENOL) 500 MG tablet Take 500 mg by mouth every 6 (six) hours as needed (for pain).    prn at prn  . albuterol (PROVENTIL HFA;VENTOLIN HFA) 108 (90 Base) MCG/ACT inhaler Inhale 2 puffs into the lungs every 6 (six) hours as needed for wheezing or shortness of breath.   prn at prn  . BREO ELLIPTA 100-25 MCG/INH AEPB Inhale 1 puff into the lungs 2 (two) times daily.   04/28/2018 at Unknown  time  . metoprolol succinate (TOPROL-XL) 25 MG 24 hr tablet Take 1 tablet (25 mg total) by mouth daily. (Patient taking differently: Take 25 mg by mouth at bedtime. )   04/28/2018 at Unknown time  . omeprazole (PRILOSEC) 20 MG capsule Take 1 capsule (20 mg total) by mouth 2 (two) times daily before a meal. 180 capsule 1 04/28/2018 at Unknown time  . pravastatin (PRAVACHOL) 10 MG tablet Take 10 mg by mouth at bedtime.    04/28/2018 at Unknown time  . warfarin (COUMADIN) 5 MG tablet Take 1 tablet (5 mg total) by mouth daily at 6 PM. Take 1 tablet daily (Patient taking differently: Take 2.5 mg by mouth daily at 6 PM. Take 1 tablet daily) 30 tablet 0 Past Week at Unknown time   Scheduled:  . fluticasone  furoate-vilanterol  1 puff Inhalation BID  . metoprolol tartrate  5 mg Intravenous Q6H  . warfarin  2 mg Oral ONCE-1800  . Warfarin - Pharmacist Dosing Inpatient   Does not apply q1800   Infusions:  . dextrose 5 % and 0.45 % NaCl with KCl 20 mEq/L 50 mL/hr at 05/03/18 1442  . famotidine (PEPCID) IV Stopped (05/04/18 1000)   PRN: albuterol, hydrALAZINE, HYDROmorphone (DILAUDID) injection, ketorolac, menthol-cetylpyridinium, ondansetron **OR** ondansetron (ZOFRAN) IV Anti-infectives (From admission, onward)   Start     Dose/Rate Route Frequency Ordered Stop   04/29/18 2115  cefTRIAXone (ROCEPHIN) 1 g in sodium chloride 0.9 % 100 mL IVPB     1 g 200 mL/hr over 30 Minutes Intravenous  Once 04/29/18 2105 04/29/18 2302      Assessment: 67 year old female admitted with supratherapeutic INR of 4.89 on 6/26. Patient states she takes warfarin 2.5mg  po daily. 7/2 INR is finally down to 2.24 and Dr Dahlia Byes has requested pharmacy restart warfarin.    Goal of Therapy:  INR 2-3 Monitor platelets by anticoagulation protocol: Yes   Plan:  Warfarin 2mg  po x 1 dose tonight, will follow with AM labs. Pharmacy to adjust to INR 2-3 per protocol.   Carly Khan 05/04/2018,3:05 PM

## 2018-05-04 NOTE — Discharge Summary (Signed)
Patient ID: Carly Khan MRN: [id number] DOB/AGE: 69-Apr-1952 69 y.o.  Admit date: 04/29/2018 Discharge date: 05/04/2018   Discharge Diagnoses:  Active Problems:   Other partial intestinal obstruction [facility])    Hospital Course:  67 year old female with multiple comorbidities including A. fib on anticoagulation presents with recurrent SBO.  She had a previous history of ileostomy and sigmoid colectomy in the past at outside facility..  She was admitted to the hospital for IV hydration n.p.o. and did well.  Her diet was advanced from a clear liquid diet to a regular diet which tolerated well.    At the time of discharge she was ambulating, she had no pain and tolerating PO.  Her vital signs were stable and she was afebrile.  Physical exam showed a female no acute distress.  Awake and alert.  Abdomen: Soft nontender no peritonitis.  Ileostomy working with parastomal hernia. Condition of the patient at the time of discharge was stable SHe is to follow  with Dr. Hampton Abbot as an outpatient as previously scheduled.      Disposition: Discharge disposition: 01-Home or Self Care       Discharge Instructions    Call MD for:  difficulty breathing, headache or visual disturbances   Complete by:  As directed    Call MD for:  extreme fatigue   Complete by:  As directed    Call MD for:  hives   Complete by:  As directed    Call MD for:  persistant dizziness or light-headedness   Complete by:  As directed    Call MD for:  persistant nausea and vomiting   Complete by:  As directed    Call MD for:  redness, tenderness, or signs of infection (pain, swelling, redness, odor or green/yellow discharge around incision site)   Complete by:  As directed    Call MD for:  severe uncontrolled pain   Complete by:  As directed    Call MD for:  temperature >100.4   Complete by:  As directed    Diet - low sodium heart healthy   Complete by:  As directed    Increase activity slowly   Complete by:  As  directed      Allergies as of 05/04/2018      Reactions   Penicillin G Hives, Itching   Has patient had a PCN reaction causing immediate rash, facial/tongue/throat swelling, SOB or lightheadedness with hypotension: Yes Has patient had a PCN reaction causing severe rash involving mucus membranes or skin necrosis: No Has patient had a PCN reaction that required hospitalization: No Has patient had a PCN reaction occurring within the last 10 years: Yes If all of the above answers are "NO", then may proceed with Cephalosporin use.   Succinylcholine    Patient's daughter had emergency surgery and was awake for the entire procedure, was told she should not have succ's and neither should her mom      Medication List    TAKE these medications   acetaminophen 500 MG tablet Commonly known as:  TYLENOL Take 500 mg by mouth every 6 (six) hours as needed (for pain).   albuterol 108 (90 Base) MCG/ACT inhaler Commonly known as:  PROVENTIL HFA;VENTOLIN HFA Inhale 2 puffs into the lungs every 6 (six) hours as needed for wheezing or shortness of breath.   BREO ELLIPTA 100-25 MCG/INH Aepb Generic drug:  fluticasone furoate-vilanterol Inhale 1 puff into the lungs 2 (two) times daily.   metoprolol succinate 25 MG 24 hr  tablet Commonly known as:  TOPROL-XL Take 1 tablet (25 mg total) by mouth daily. What changed:  when to take this   omeprazole 20 MG capsule Commonly known as:  PRILOSEC Take 1 capsule (20 mg total) by mouth 2 (two) times daily before a meal.   pravastatin 10 MG tablet Commonly known as:  PRAVACHOL Take 10 mg by mouth at bedtime.   warfarin 5 MG tablet Commonly known as:  COUMADIN Take as directed. If you are unsure how to take this medication, talk to your nurse or doctor. Original instructions:  Take 1 tablet (5 mg total) by mouth daily at 6 PM. Take 1 tablet daily What changed:    how much to take  additional instructions      Follow-up Information    Olean Ree,  MD. Go on 05/14/2018.   Specialty:  General Surgery Why:  Friday July 12th at 10:15am for a follow-up appointment  Contact information: [place] [address] [phone]            Caroleen Hamman, MD FACS

## 2018-05-04 NOTE — Progress Notes (Signed)
CC: SBO Subjective: Feeling bettter, this have ? Emesis. Wants to go home again.+ Flatus  Objective: Vital signs in last 24 hours: Temp:  [98.1 F (36.7 C)-98.3 F (36.8 C)] 98.3 F (36.8 C) (07/02 0450) Pulse Rate:  [59-84] 68 (07/02 0450) Resp:  [20] 20 (07/02 0450) BP: (147-176)/(63-86) 161/63 (07/02 0450) SpO2:  [98 %-100 %] 98 % (07/02 0450) Last BM Date: 05/02/18(colostomy)  Intake/Output from previous day: 07/01 0701 - 07/02 0700 In: 1648 [P.O.:600; I.V.:998; IV Piggyback:50] Out: 300 [Urine:300] Intake/Output this shift: Total I/O In: 855 [P.O.:240; I.V.:615] Out: -   Physical exam: NAD, awake alert Abd: soft, nt, no peritonitis, + BS Ext: no edema and well perfused  Lab Results: CBC  Recent Labs    05/04/18 0651  WBC 6.5  HGB 14.8  HCT 43.5  PLT 197   BMET Recent Labs    05/04/18 0651  NA 141  K 4.0  CL 106  CO2 26  GLUCOSE 100*  BUN <5*  CREATININE 0.90  CALCIUM 8.7*   PT/INR Recent Labs    05/02/18 0706 05/03/18 0741  LABPROT 28.2* 24.6*  INR 2.67 2.24   ABG No results for input(s): PHART, HCO3 in the last 72 hours.  Invalid input(s): PCO2, PO2  Studies/Results: No results found.  Anti-infectives: Anti-infectives (From admission, onward)   Start     Dose/Rate Route Frequency Ordered Stop   04/29/18 2115  cefTRIAXone (ROCEPHIN) 1 g in sodium chloride 0.9 % 100 mL IVPB     1 g 200 mL/hr over 30 Minutes Intravenous  Once 04/29/18 2105 04/29/18 2302      Assessment/Plan:  Resolving SBO We will keep one more night and repeat xrays in am Pt understands  Caroleen Hamman, MD, FACS  05/04/2018

## 2018-05-05 ENCOUNTER — Inpatient Hospital Stay: Payer: Medicare Other

## 2018-05-05 ENCOUNTER — Ambulatory Visit: Payer: Medicare Other | Admitting: Internal Medicine

## 2018-05-05 LAB — PROTIME-INR
INR: 1.32
Prothrombin Time: 16.3 seconds — ABNORMAL HIGH (ref 11.4–15.2)

## 2018-05-05 LAB — BASIC METABOLIC PANEL
ANION GAP: 8 (ref 5–15)
BUN: 7 mg/dL — ABNORMAL LOW (ref 8–23)
CHLORIDE: 105 mmol/L (ref 98–111)
CO2: 25 mmol/L (ref 22–32)
CREATININE: 1 mg/dL (ref 0.44–1.00)
Calcium: 8.6 mg/dL — ABNORMAL LOW (ref 8.9–10.3)
GFR calc non Af Amer: 57 mL/min — ABNORMAL LOW (ref >60)
Glucose, Bld: 83 mg/dL (ref 70–99)
POTASSIUM: 3.8 mmol/L (ref 3.5–5.1)
Sodium: 138 mmol/L (ref 135–145)

## 2018-05-05 LAB — CBC
HEMATOCRIT: 39 % (ref 35.0–47.0)
HEMOGLOBIN: 13.6 g/dL (ref 12.0–16.0)
MCH: 31.8 pg (ref 26.0–34.0)
MCHC: 34.9 g/dL (ref 32.0–36.0)
MCV: 91.2 fL (ref 80.0–100.0)
Platelets: 201 10*3/uL (ref 150–440)
RBC: 4.28 MIL/uL (ref 3.80–5.20)
RDW: 12.7 % (ref 11.5–14.5)
WBC: 5.6 10*3/uL (ref 3.6–11.0)

## 2018-05-05 MED ORDER — ENOXAPARIN SODIUM 60 MG/0.6ML ~~LOC~~ SOLN
1.0000 mg/kg | SUBCUTANEOUS | Status: DC
  Administered 2018-05-06: 60 mg via SUBCUTANEOUS
  Filled 2018-05-05: qty 0.6

## 2018-05-05 MED ORDER — PROMETHAZINE HCL 25 MG/ML IJ SOLN
12.5000 mg | Freq: Four times a day (QID) | INTRAMUSCULAR | Status: DC | PRN
  Administered 2018-05-05: 12.5 mg via INTRAVENOUS
  Filled 2018-05-05: qty 1

## 2018-05-05 MED ORDER — WARFARIN SODIUM 5 MG PO TABS
5.0000 mg | ORAL_TABLET | Freq: Once | ORAL | Status: DC
  Filled 2018-05-05: qty 1

## 2018-05-05 MED ORDER — LACTATED RINGERS IV SOLN
INTRAVENOUS | Status: DC
  Administered 2018-05-05 – 2018-05-06 (×2): via INTRAVENOUS

## 2018-05-05 MED ORDER — DIATRIZOATE MEGLUMINE & SODIUM 66-10 % PO SOLN
90.0000 mL | Freq: Once | ORAL | Status: AC
  Administered 2018-05-05: 90 mL via NASOGASTRIC

## 2018-05-05 MED ORDER — ENOXAPARIN SODIUM 40 MG/0.4ML ~~LOC~~ SOLN
40.0000 mg | SUBCUTANEOUS | Status: DC
  Administered 2018-05-05: 40 mg via SUBCUTANEOUS
  Filled 2018-05-05: qty 0.4

## 2018-05-05 NOTE — Care Management Important Message (Signed)
Copy of signed IM left with patient in room.  

## 2018-05-05 NOTE — Telephone Encounter (Signed)
Patient still currently admitted. Had plans for discharge yesterday, but remains admitted due to nausea.

## 2018-05-05 NOTE — Progress Notes (Signed)
CC: SBO Subjective: She was ready for discharge yesterday had some nausea and vomiting.  I just saw the patient and she had breakfast this morning and did have some nausea.  She is passing gas and her colostomy is working.  KUB personally reviewed today showing improvement in the gas pattern.  No free air.  White count is normal.  INR is 1.3.  Objective: Vital signs in last 24 hours: Temp:  [97.6 F (36.4 C)-98 F (36.7 C)] 97.6 F (36.4 C) (07/03 0515) Pulse Rate:  [57-64] 59 (07/03 0515) Resp:  [20] 20 (07/03 0515) BP: (120-161)/(76-85) 154/76 (07/03 0515) SpO2:  [97 %-98 %] 97 % (07/03 0515) Last BM Date: 05/05/18  Intake/Output from previous day: 07/02 0701 - 07/03 0700 In: 1213 [P.O.:598; I.V.:615] Out: -  Intake/Output this shift: Total I/O In: 360 [P.O.:360] Out: 0   Physical exam:  NAD Awake and alert Abd: soft, nt, no peritonitis Ostomy working Ext: no edema, and well perfused  Lab Results: CBC  Recent Labs    05/04/18 0651 05/05/18 0416  WBC 6.5 5.6  HGB 14.8 13.6  HCT 43.5 39.0  PLT 197 201   BMET Recent Labs    05/04/18 0651 05/05/18 0416  NA 141 138  K 4.0 3.8  CL 106 105  CO2 26 25  GLUCOSE 100* 83  BUN <5* 7*  CREATININE 0.90 1.00  CALCIUM 8.7* 8.6*   PT/INR Recent Labs    05/03/18 0741 05/05/18 0416  LABPROT 24.6* 16.3*  INR 2.24 1.32   ABG No results for input(s): PHART, HCO3 in the last 72 hours.  Invalid input(s): PCO2, PO2  Studies/Results: Dg Abd Acute W/chest  Result Date: 05/05/2018 CLINICAL DATA:  Small bowel obstruction. EXAM: DG ABDOMEN ACUTE W/ 1V CHEST COMPARISON:  Radiographs dated 05/02/2018 07/10/2017 05/01/2018 and 04/29/2018 and CT scans dated 04/29/2018 and 11/18/2017 FINDINGS: NG tube has been removed. There is a single slightly distended small bowel loops in the mid abdomen. The stomach is not distended. Parastomal hernia containing small bowel is noted in the right lower quadrant. Those loops are not  dilated. No free air or free fluid. No acute abnormality in the chest. Prior CABG. Scarring at the left lung base. Multiple old healed left lateral rib fractures. IMPRESSION: Improved small bowel obstructive pattern with a single slightly distended loops of small bowel in the mid abdomen. 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 Electronically Signed   By: Lorriane Shire M.D.   On: 05/05/2018 09:14    Anti-infectives: Anti-infectives (From admission, onward)   Start     Dose/Rate Route Frequency Ordered Stop   04/29/18 2115  cefTRIAXone (ROCEPHIN) 1 g in sodium chloride 0.9 % 100 mL IVPB     1 g 200 mL/hr over 30 Minutes Intravenous  Once 04/29/18 2105 04/29/18 2302      Assessment/Plan:  Recurrent SBO Situation because she clinically feels fine but every time she has some fluid now becomes nauseated.  We will perform a Gastrografin study and small bowel follow-through.  I feel that she does have persistent partial small bowel obstruction. We will plan to discontinue the Coumadin for now just transition to therapeutic Lovenox. She may need surgical intervention during this hospitalization if her symptoms do not improve.  Discussed with daughter in detail  Caroleen Hamman, MD, [facility]  05/05/2018

## 2018-05-05 NOTE — Progress Notes (Signed)
ANTICOAGULATION CONSULT NOTE - Follow up [place] for warfarin dosing  Indication: atrial fibrillation  Allergies  Allergen Reactions  . Penicillin G Hives and Itching    Has patient had a PCN reaction causing immediate rash, facial/tongue/throat swelling, SOB or lightheadedness with hypotension: Yes Has patient had a PCN reaction causing severe rash involving mucus membranes or skin necrosis: No Has patient had a PCN reaction that required hospitalization: No Has patient had a PCN reaction occurring within the last 10 years: Yes If all of the above answers are "NO", then may proceed with Cephalosporin use.  . Succinylcholine     Patient's daughter had emergency surgery and was awake for the entire procedure, was told she should not have succ's and neither should her mom     Patient Measurements: Height: 5\' 4"  (162.6 cm) Weight: 133 lb 8 oz (60.6 kg) IBW/kg (Calculated) : 54.7  Vital Signs: Temp: 97.6 F (36.4 C) (07/03 0515) Temp Source: Oral (07/03 0515) BP: 154/76 (07/03 0515) Pulse Rate: 59 (07/03 0515)  Labs: Recent Labs    05/03/18 0741 05/04/18 0651 05/05/18 0416  HGB  --  14.8 13.6  HCT  --  43.5 39.0  PLT  --  197 201  LABPROT 24.6*  --  16.3*  INR 2.24  --  1.32  CREATININE  --  0.90 1.00    Estimated Creatinine Clearance: 46.5 mL/min (by C-G formula based on SCr of 1 mg/dL).   Assessment: 67 year old female admitted with supratherapeutic INR of 4.89 on 6/26. Patient states she takes warfarin 2.5mg  po daily. 7/2 INR is finally down to 2.24 and Dr Dahlia Byes has requested pharmacy restart warfarin.   Per warfarin clinic notes:  6/19 regimen changed to 2.5 mg MWF and 5 mg all other days (previously on 5 mg daily) 6/26 regimen changed to 2.5 mg daily   Goal of Therapy:  INR 2-3 Monitor platelets by anticoagulation protocol: Yes   Plan:  INR subtherapeutic at 1.32, likely due to holding for warfarin for almost a week. No INR available from  yesterday. Will give higher dose of warfarin 5 mg PO x1 tonight. Follow up INR in AM.  Anticipate pt will need higher dose for 1-2 days then resume home regimen of warfarin 2.5 mg daily.   Pharmacy will continue to follow.    Rocky Morel 05/05/2018,7:41 AM

## 2018-05-06 LAB — COMPREHENSIVE METABOLIC PANEL
ALBUMIN: 3.1 g/dL — AB (ref 3.5–5.0)
ALT: 7 U/L (ref 0–44)
AST: 19 U/L (ref 15–41)
Alkaline Phosphatase: 55 U/L (ref 38–126)
Anion gap: 8 (ref 5–15)
BUN: 9 mg/dL (ref 8–23)
CHLORIDE: 106 mmol/L (ref 98–111)
CO2: 26 mmol/L (ref 22–32)
CREATININE: 1.11 mg/dL — AB (ref 0.44–1.00)
Calcium: 8.4 mg/dL — ABNORMAL LOW (ref 8.9–10.3)
GFR calc Af Amer: 58 mL/min — ABNORMAL LOW (ref >60)
GFR calc non Af Amer: 50 mL/min — ABNORMAL LOW (ref >60)
Glucose, Bld: 74 mg/dL (ref 70–99)
Potassium: 3.6 mmol/L (ref 3.5–5.1)
SODIUM: 140 mmol/L (ref 135–145)
Total Bilirubin: 1.1 mg/dL (ref 0.3–1.2)
Total Protein: 5.8 g/dL — ABNORMAL LOW (ref 6.5–8.1)

## 2018-05-06 LAB — CBC
HEMATOCRIT: 39.5 % (ref 35.0–47.0)
Hemoglobin: 13.6 g/dL (ref 12.0–16.0)
MCH: 31.4 pg (ref 26.0–34.0)
MCHC: 34.4 g/dL (ref 32.0–36.0)
MCV: 91.1 fL (ref 80.0–100.0)
PLATELETS: 179 10*3/uL (ref 150–440)
RBC: 4.33 MIL/uL (ref 3.80–5.20)
RDW: 12.7 % (ref 11.5–14.5)
WBC: 5.8 10*3/uL (ref 3.6–11.0)

## 2018-05-06 LAB — PROTIME-INR
INR: 1.41
Prothrombin Time: 17.1 seconds — ABNORMAL HIGH (ref 11.4–15.2)

## 2018-05-06 MED ORDER — ENOXAPARIN SODIUM 60 MG/0.6ML ~~LOC~~ SOLN
1.0000 mg/kg | Freq: Two times a day (BID) | SUBCUTANEOUS | Status: DC
  Administered 2018-05-06 – 2018-05-08 (×5): 60 mg via SUBCUTANEOUS
  Filled 2018-05-06 (×7): qty 0.6

## 2018-05-06 MED ORDER — WARFARIN - PHARMACIST DOSING INPATIENT
Freq: Every day | Status: DC
  Administered 2018-05-08: 18:00:00

## 2018-05-06 MED ORDER — WARFARIN SODIUM 5 MG PO TABS
5.0000 mg | ORAL_TABLET | Freq: Once | ORAL | Status: AC
  Administered 2018-05-06: 5 mg via ORAL
  Filled 2018-05-06: qty 1

## 2018-05-06 NOTE — Progress Notes (Signed)
ANTICOAGULATION CONSULT NOTE - Follow up [place] for warfarin dosing  Indication: atrial fibrillation  Allergies  Allergen Reactions  . Penicillin G Hives and Itching    Has patient had a PCN reaction causing immediate rash, facial/tongue/throat swelling, SOB or lightheadedness with hypotension: Yes Has patient had a PCN reaction causing severe rash involving mucus membranes or skin necrosis: No Has patient had a PCN reaction that required hospitalization: No Has patient had a PCN reaction occurring within the last 10 years: Yes If all of the above answers are "NO", then may proceed with Cephalosporin use.  . Succinylcholine     Patient's daughter had emergency surgery and was awake for the entire procedure, was told she should not have succ's and neither should her mom     Patient Measurements: Height: 5\' 4"  (162.6 cm) Weight: 135 lb 2.3 oz (61.3 kg) IBW/kg (Calculated) : 54.7  Vital Signs: Temp: 97.8 F (36.6 C) (07/04 0320) Temp Source: Oral (07/04 0320) BP: 143/78 (07/04 0320) Pulse Rate: 64 (07/04 0320)  Labs: Recent Labs    05/04/18 0651 05/05/18 0416 05/06/18 0600  HGB 14.8 13.6 13.6  HCT 43.5 39.0 39.5  PLT 197 201 179  LABPROT  --  16.3* 17.1*  INR  --  1.32 1.41  CREATININE 0.90 1.00 1.11*    Estimated Creatinine Clearance: 41.9 mL/min (A) (by C-G formula based on SCr of 1.11 mg/dL (H)).   Assessment: 67 year old female admitted with supratherapeutic INR of 4.89 on 6/26. Patient states she takes warfarin 2.5mg  po daily. 7/2 INR is finally down to 2.24 and Dr Dahlia Byes has requested pharmacy restart warfarin.   Per warfarin clinic notes:  6/19 regimen changed to 2.5 mg MWF and 5 mg all other days (previously on 5 mg daily) 6/26 regimen changed to 2.5 mg daily  DATE INR DOSE  7/3 1.32 MISSED 7/4 1.41   Goal of Therapy:  INR 2-3 Monitor platelets by anticoagulation protocol: Yes   Plan:  INR subtherapeutic at 1.32, likely due to  holding for warfarin for almost a week. No INR available from yesterday. Will give higher dose of warfarin 5 mg PO x1 tonight. Follow up INR in AM.  Anticipate pt will need higher dose for 1-2 days then resume home regimen of warfarin 2.5 mg daily.   05/06/18 07:45 warfarin dose not give last night. Will order warfarin 5 mg po daily x 1 dose for this evening and try again.  Pharmacy will continue to follow.    Laural Benes 05/06/2018,7:45 AM

## 2018-05-06 NOTE — Progress Notes (Signed)
SURGICAL PROGRESS NOTE (cpt [phone])  Hospital Day(s): 7.   Post op day(s):  Marland Kitchen   Interval History: Patient seen and examined, no acute events or new complaints overnight. Patient reports she is hungry and denies abdominal pain or N/V with ongoing enteric liquid and gas from her ileostomy. She says she feels "fine" and would like to go home before surgery, denies fever/chills, CP, or SOB.  Review of Systems:  Constitutional: denies fever, chills  HEENT: denies cough or congestion  Respiratory: denies any shortness of breath  Cardiovascular: denies chest pain or palpitations  Gastrointestinal: abdominal pain, N/V, and bowel function as per interval history Genitourinary: denies burning with urination or urinary frequency Musculoskeletal: denies pain, decreased motor or sensation Integumentary: denies any other rashes or skin discolorations Neurological: denies HA or vision/hearing changes   Vital signs in last 24 hours: [min-max] current  Temp:  [97.7 F (36.5 C)-98.3 F (36.8 C)] 97.8 F (36.6 C) (07/04 0320) Pulse Rate:  [54-64] 64 (07/04 0320) Resp:  [18-20] 18 (07/04 0320) BP: (143-192)/(70-91) 143/78 (07/04 0320) SpO2:  [97 %-100 %] 100 % (07/04 0320) Weight:  [135 lb 2.3 oz (61.3 kg)] 135 lb 2.3 oz (61.3 kg) (07/03 1453)     Height: 5\' 4"  (162.6 cm) Weight: 135 lb 2.3 oz (61.3 kg) BMI (Calculated): 23.19   Intake/Output this shift:  No intake/output data recorded.   Intake/Output last 2 shifts:  @IOLAST2SHIFTS @   Physical Exam:  Constitutional: alert, cooperative and no distress  HENT: normocephalic without obvious abnormality  Eyes: PERRL, EOM's grossly intact and symmetric  Neuro: CN II - XII grossly intact and symmetric without deficit  Respiratory: breathing non-labored at rest  Cardiovascular: regular rate and sinus rhythm  Gastrointestinal: soft, completely non-tender, and non-distended with enteric liquid and gas filling ileostomy bag Musculoskeletal: UE and  LE FROM, no edema or wounds, motor and sensation grossly intact, NT   Labs:  CBC Latest Ref Rng & Units 05/06/2018 05/05/2018 05/04/2018  WBC 3.6 - 11.0 K/uL 5.8 5.6 6.5  Hemoglobin 12.0 - 16.0 g/dL 13.6 13.6 14.8  Hematocrit 35.0 - 47.0 % 39.5 39.0 43.5  Platelets 150 - 440 K/uL 179 201 197   CMP Latest Ref Rng & Units 05/06/2018 05/05/2018 05/04/2018  Glucose 70 - 99 mg/dL 74 83 100(H)  BUN 8 - 23 mg/dL 9 7(L) <5(L)  Creatinine 0.44 - 1.00 mg/dL 1.11(H) 1.00 0.90  Sodium 135 - 145 mmol/L 140 138 141  Potassium 3.5 - 5.1 mmol/L 3.6 3.8 4.0  Chloride 98 - 111 mmol/L 106 105 106  CO2 22 - 32 mmol/L 26 25 26   Calcium 8.9 - 10.3 mg/dL 8.4(L) 8.6(L) 8.7(L)  Total Protein 6.5 - 8.1 g/dL 5.8(L) - -  Total Bilirubin 0.3 - 1.2 mg/dL 1.1 - -  Alkaline Phos 38 - 126 U/L 55 - -  AST 15 - 41 U/L 19 - -  ALT 0 - 44 U/L 7 - -  Imaging studies: No new pertinent imaging studies, though yesterday's imaging studies personally reviewed and discussed with patient  Assessment/Plan: (ICD-10's: K56.52) 67 y.o.femalewith what appears to be resolving complete small bowel obstruction, likely attributable to post-surgical adhesions followingpartial sigmoid colectomy with primary anastomosis for diverticular disease,superimposed on chronic parastomal hernia-associated partial SBOandcomplicated by comorbidities including"pre-diabetes", HTN, hypercholesterolemia, CKD, CAD s/p CABG (1991), Coumadin anticoagulation for atrial fibrillation with Left atrial appendage thrombus on CT (04/2017) not visualized on TEE 05/2017) s/p stroke, COPD, Left lung cancer s/p Left upper lobectomy (2017), 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  - resumed regular diet - monitor ongoing bowel function and abdominal exam - will continue Coumadin with Lovenox bridging while inpatient - if patient tolerates diet, outpatient surgical  follow-upto schedule ileostomy reversal - surgical intervention if doesn't improve was also discussed at length - ambulationand smoking cessationencouraged - medical management of comorbidities  All of the above findings and recommendations were discussed with the patient,patient's family, and her RN, and all of patient's and family's questions were answered to their expressed satisfaction.  -- Marilynne Drivers Rosana Hoes, MD, [place]: [facility] - Partnering for exceptional care. Office: [phone]

## 2018-05-07 LAB — PROTIME-INR
INR: 1.21
PROTHROMBIN TIME: 15.2 s (ref 11.4–15.2)

## 2018-05-07 MED ORDER — PANTOPRAZOLE SODIUM 40 MG PO TBEC
40.0000 mg | DELAYED_RELEASE_TABLET | Freq: Every day | ORAL | Status: DC
  Administered 2018-05-07 – 2018-05-08 (×2): 40 mg via ORAL
  Filled 2018-05-07 (×2): qty 1

## 2018-05-07 MED ORDER — METOPROLOL SUCCINATE ER 25 MG PO TB24
25.0000 mg | ORAL_TABLET | Freq: Every day | ORAL | Status: DC
  Administered 2018-05-07 – 2018-05-08 (×2): 25 mg via ORAL
  Filled 2018-05-07 (×2): qty 1

## 2018-05-07 MED ORDER — PRAVASTATIN SODIUM 20 MG PO TABS
10.0000 mg | ORAL_TABLET | Freq: Every day | ORAL | Status: DC
  Administered 2018-05-07 – 2018-05-08 (×2): 10 mg via ORAL
  Filled 2018-05-07 (×2): qty 1

## 2018-05-07 MED ORDER — WARFARIN SODIUM 5 MG PO TABS
5.0000 mg | ORAL_TABLET | Freq: Once | ORAL | Status: AC
  Administered 2018-05-07: 5 mg via ORAL
  Filled 2018-05-07: qty 1

## 2018-05-07 NOTE — Telephone Encounter (Signed)
Patient still currently admitted.

## 2018-05-07 NOTE — Care Management Important Message (Signed)
Copy of signed IM left with patient in room.  

## 2018-05-07 NOTE — Progress Notes (Signed)
SURGICAL PROGRESS NOTE (cpt [phone])  Hospital Day(s): 8.   Post op day(s):  Marland Kitchen   Interval History: Patient seen and examined, no acute events or new complaints overnight. Patient reports she continues to experience mild epigastric abdominal pain and sensation of food regurgitating with nausea without emesis, denies abdominal pain or distention otherwise with ongoing enteric material and gas into her ileostomy bag already changed once this morning and full again.  Review of Systems:  Constitutional: denies fever, chills  HEENT: denies cough or congestion  Respiratory: denies any shortness of breath  Cardiovascular: denies chest pain or palpitations  Gastrointestinal: abdominal pain, N/V, and bowel function as per interval history Genitourinary: denies burning with urination or urinary frequency Musculoskeletal: denies pain, decreased motor or sensation Integumentary: denies any other rashes or skin discolorations except chronic loop ileostomy Neurological: denies HA or vision/hearing changes   Vital signs in last 24 hours: [min-max] current  Temp:  [97.6 F (36.4 C)-98.8 F (37.1 C)] 97.6 F (36.4 C) (07/05 1320) Pulse Rate:  [57-68] 61 (07/05 1320) Resp:  [18-20] 20 (07/05 1227) BP: (120-172)/(54-87) 120/72 (07/05 1432) SpO2:  [98 %-100 %] 100 % (07/05 1320)     Height: 5\' 4"  (162.6 cm) Weight: 135 lb 2.3 oz (61.3 kg) BMI (Calculated): 23.19   Intake/Output this shift:  Total I/O In: 360 [P.O.:360] Out: -    Intake/Output last 2 shifts:  @IOLAST2SHIFTS @   Physical Exam:  Constitutional: alert, cooperative and no distress  HENT: normocephalic without obvious abnormality  Eyes: PERRL, EOM's grossly intact and symmetric  Neuro: CN II - XII grossly intact and symmetric without deficit  Respiratory: breathing non-labored at rest  Cardiovascular: regular rate and sinus rhythm  Gastrointestinal: soft, non-tender, and non-distended with viable and functional loop ileostomy with  enteric material and gas in ostomy bag Musculoskeletal: UE and LE FROM, no edema or wounds, motor and sensation grossly intact, NT   Labs:  CBC Latest Ref Rng & Units 05/06/2018 05/05/2018 05/04/2018  WBC 3.6 - 11.0 K/uL 5.8 5.6 6.5  Hemoglobin 12.0 - 16.0 g/dL 13.6 13.6 14.8  Hematocrit 35.0 - 47.0 % 39.5 39.0 43.5  Platelets 150 - 440 K/uL 179 201 197   CMP Latest Ref Rng & Units 05/06/2018 05/05/2018 05/04/2018  Glucose 70 - 99 mg/dL 74 83 100(H)  BUN 8 - 23 mg/dL 9 7(L) <5(L)  Creatinine 0.44 - 1.00 mg/dL 1.11(H) 1.00 0.90  Sodium 135 - 145 mmol/L 140 138 141  Potassium 3.5 - 5.1 mmol/L 3.6 3.8 4.0  Chloride 98 - 111 mmol/L 106 105 106  CO2 22 - 32 mmol/L 26 25 26   Calcium 8.9 - 10.3 mg/dL 8.4(L) 8.6(L) 8.7(L)  Total Protein 6.5 - 8.1 g/dL 5.8(L) - -  Total Bilirubin 0.3 - 1.2 mg/dL 1.1 - -  Alkaline Phos 38 - 126 U/L 55 - -  AST 15 - 41 U/L 19 - -  ALT 0 - 44 U/L 7 - -   INR (05/07/2018): 1.21 (down from 1.41, which was up from 1.32)  Imaging studies: No new pertinent imaging studies   Assessment/Plan: (ICD-10's: K56.52) 67 y.o.femalewithwhat appears to be resolvingcomplete small bowel obstruction, likely attributable to post-surgical adhesions followingpartial sigmoid colectomy with primary anastomosis for diverticular disease,superimposed on chronic parastomal hernia-associated partial SBOandcomplicated by comorbidities including"pre-diabetes", HTN, hypercholesterolemia, CKD, CAD s/p CABG (1991), Coumadin anticoagulation for atrial fibrillation with Left atrial appendage thrombus on CT (04/2017) not visualized on TEE 05/2017) s/p stroke, COPD, Left lung cancer s/p Left upper  lobectomy (2017), 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  - continue regular diet  - pantoprazole added to patient's medications - monitor ongoing bowel function and abdominal exam - will continue Coumadin with Lovenox  bridging while inpatient - if patient tolerates diet, outpatient surgical follow-upto schedule ileostomy reversal - surgical intervention if doesn't improve was also discussed at length - ambulationand smoking cessationencouraged - medical management of comorbidities  All of the above findings and recommendations were discussed with the patient,patient's family,and her RN,and all of patient's and family's questions were answered to their expressed satisfaction.  -- Marilynne Drivers Rosana Hoes, MD, [place]: [facility] - Partnering for exceptional care. Office: [phone]

## 2018-05-07 NOTE — Progress Notes (Signed)
Dr Rosana Hoes gave the order to put patient back on her home meds and discontinue IV metoprolol.  meds verified with [facility] and [facility].  Orders put in mor pravastatin and meroprolol

## 2018-05-07 NOTE — Progress Notes (Signed)
ANTICOAGULATION CONSULT NOTE - Follow up [place] for warfarin dosing  Indication: atrial fibrillation  Allergies  Allergen Reactions  . Penicillin G Hives and Itching    Has patient had a PCN reaction causing immediate rash, facial/tongue/throat swelling, SOB or lightheadedness with hypotension: Yes Has patient had a PCN reaction causing severe rash involving mucus membranes or skin necrosis: No Has patient had a PCN reaction that required hospitalization: No Has patient had a PCN reaction occurring within the last 10 years: Yes If all of the above answers are "NO", then may proceed with Cephalosporin use.  . Succinylcholine     Patient's daughter had emergency surgery and was awake for the entire procedure, was told she should not have succ's and neither should her mom     Patient Measurements: Height: 5\' 4"  (162.6 cm) Weight: 135 lb 2.3 oz (61.3 kg) IBW/kg (Calculated) : 54.7  Vital Signs: Temp: 98.1 F (36.7 C) (07/05 0539) Temp Source: Oral (07/05 0539) BP: 135/72 (07/05 0539) Pulse Rate: 60 (07/05 0539)  Labs: Recent Labs    05/05/18 0416 05/06/18 0600 05/07/18 0453  HGB 13.6 13.6  --   HCT 39.0 39.5  --   PLT 201 179  --   LABPROT 16.3* 17.1* 15.2  INR 1.32 1.41 1.21  CREATININE 1.00 1.11*  --     Estimated Creatinine Clearance: 41.9 mL/min (A) (by C-G formula based on SCr of 1.11 mg/dL (H)).   Assessment: 67 year old female admitted with supratherapeutic INR of 4.89 on 6/26. Patient states she takes warfarin 2.5mg  po daily. 7/2 INR is finally down to 2.24 and Dr Dahlia Byes has requested pharmacy restart warfarin.   Per warfarin clinic notes:  6/19 regimen changed to 2.5 mg MWF and 5 mg all other days (previously on 5 mg daily) 6/26 regimen changed to 2.5 mg daily   Warfarin held for 6 days prior to restarting on 7/2  DATE INR DOSE  7/2 -- 2mg  7/3 1.32 Warfarin consult canceled 7/4 1.41 5 mg 7/5 1.21   Goal of Therapy:  INR  2-3 Monitor platelets by anticoagulation protocol: Yes   Plan:  INR subtherapeutic at 1.21 (trended down), likely due to holding for warfarin for almost a week and sporadic dosing.  Will repeat higher dose of warfarin 5 mg PO x1 tonight. Follow up INR in AM.   Will need to order CBC at least every three days per policy. Pt also on therapeutic enoxaparin for bridging while inpatient.   Pharmacy will continue to follow.    Rayna Sexton L 05/07/2018,8:40 AM

## 2018-05-08 LAB — PROTIME-INR
INR: 1.67
Prothrombin Time: 19.6 seconds — ABNORMAL HIGH (ref 11.4–15.2)

## 2018-05-08 MED ORDER — WARFARIN SODIUM 5 MG PO TABS
5.0000 mg | ORAL_TABLET | Freq: Once | ORAL | Status: AC
  Administered 2018-05-08: 5 mg via ORAL
  Filled 2018-05-08: qty 1

## 2018-05-08 NOTE — Discharge Instructions (Addendum)
Take 2.5 mg Coumadin as previously instructed per Coumadin clinic with outpatient Coumadin clinic follow-up.

## 2018-05-08 NOTE — Progress Notes (Signed)
ANTICOAGULATION CONSULT NOTE - Follow up [place] for warfarin dosing  Indication: atrial fibrillation  Allergies  Allergen Reactions  . Penicillin G Hives and Itching    Has patient had a PCN reaction causing immediate rash, facial/tongue/throat swelling, SOB or lightheadedness with hypotension: Yes Has patient had a PCN reaction causing severe rash involving mucus membranes or skin necrosis: No Has patient had a PCN reaction that required hospitalization: No Has patient had a PCN reaction occurring within the last 10 years: Yes If all of the above answers are "NO", then may proceed with Cephalosporin use.  . Succinylcholine     Patient's daughter had emergency surgery and was awake for the entire procedure, was told she should not have succ's and neither should her mom     Patient Measurements: Height: 5\' 4"  (162.6 cm) Weight: 135 lb 2.3 oz (61.3 kg) IBW/kg (Calculated) : 54.7  Vital Signs: Temp: 98.5 F (36.9 C) (07/06 0429) Temp Source: Oral (07/06 0429) BP: 149/71 (07/06 0429) Pulse Rate: 56 (07/06 0429)  Labs: Recent Labs    05/06/18 0600 05/07/18 0453 05/08/18 0554  HGB 13.6  --   --   HCT 39.5  --   --   PLT 179  --   --   LABPROT 17.1* 15.2 19.6*  INR 1.41 1.21 1.67  CREATININE 1.11*  --   --     Estimated Creatinine Clearance: 41.9 mL/min (A) (by C-G formula based on SCr of 1.11 mg/dL (H)).   Assessment: 67 year old female admitted with supratherapeutic INR of 4.89 on 6/26. Patient states she takes warfarin 2.5mg  po daily. 7/2 INR is finally down to 2.24 and Dr Dahlia Byes has requested pharmacy restart warfarin.   Per warfarin clinic notes:  6/19 regimen changed to 2.5 mg MWF and 5 mg all other days (previously on 5 mg daily) 6/26 regimen changed to 2.5 mg daily   Warfarin held for 6 days prior to restarting on 7/2  DATE INR DOSE  7/2 -- 2mg  7/3 1.32 Warfarin consult canceled 7/4 1.41 5 mg 7/5 1.21 5 mg  7/6       1.67     Goal of  Therapy:  INR 2-3 Monitor platelets by anticoagulation protocol: Yes   Plan:  INR subtherapeutic. Order warfarin 5mg  tonight.  Pt also on therapeutic enoxaparin for bridging while inpatient.  Follow up INR with am labs. Will need to order CBC at least every three days per policy.  Pharmacy will continue to follow.   Danila Eddie K, [place] 05/08/2018,11:18 AM

## 2018-05-09 LAB — CBC
HEMATOCRIT: 36.3 % (ref 35.0–47.0)
HEMOGLOBIN: 12.5 g/dL (ref 12.0–16.0)
MCH: 31.4 pg (ref 26.0–34.0)
MCHC: 34.5 g/dL (ref 32.0–36.0)
MCV: 91.2 fL (ref 80.0–100.0)
Platelets: 177 10*3/uL (ref 150–440)
RBC: 3.98 MIL/uL (ref 3.80–5.20)
RDW: 12.9 % (ref 11.5–14.5)
WBC: 4.3 10*3/uL (ref 3.6–11.0)

## 2018-05-09 LAB — PROTIME-INR
INR: 2.16
Prothrombin Time: 23.9 seconds — ABNORMAL HIGH (ref 11.4–15.2)

## 2018-05-09 MED ORDER — WARFARIN SODIUM 2.5 MG PO TABS
2.5000 mg | ORAL_TABLET | Freq: Once | ORAL | Status: DC
  Filled 2018-05-09: qty 1

## 2018-05-09 NOTE — Progress Notes (Signed)
SURGICAL PROGRESS NOTE (cpt [phone])  Hospital Day(s): 10.   Post op day(s):  Carly Khan   Interval History: Patient seen and examined, no acute events or new complaints overnight. Patient reports her post-prandial epigastric abdominal pain and nausea/reflux both resolved with resuming oral PPI, denies any fever/chills, CP, or SOB with ongoing ileostomy function.  Review of Systems:  Constitutional: denies fever, chills  HEENT: denies cough or congestion  Respiratory: denies any shortness of breath  Cardiovascular: denies chest pain or palpitations  Gastrointestinal: abdominal pain, N/V, and bowel function as per interval history Genitourinary: denies burning with urination or urinary frequency Musculoskeletal: denies pain, decreased motor or sensation Integumentary: denies any other rashes or skin discolorations except chronic loop ileostomy Neurological: denies HA or vision/hearing changes   Vital signs in last 24 hours: [min-max] current  Temp:  [97.9 F (36.6 C)-98.5 F (36.9 C)] 97.9 F (36.6 C) (07/07 0458) Pulse Rate:  [57-83] 59 (07/07 0458) Resp:  [18] 18 (07/07 0458) BP: (155-178)/(72-94) 155/72 (07/07 0458) SpO2:  [95 %-100 %] 95 % (07/07 0458)     Height: 5\' 4"  (162.6 cm) Weight: 135 lb 2.3 oz (61.3 kg) BMI (Calculated): 23.19   Intake/Output this shift:  Total I/O In: 600 [P.O.:600] Out: -    Intake/Output last 2 shifts:  @IOLAST2SHIFTS @   Physical Exam:  Constitutional: alert, cooperative and no distress  HENT: normocephalic without obvious abnormality  Eyes: PERRL, EOM's grossly intact and symmetric  Neuro: CN II - XII grossly intact and symmetric without deficit  Respiratory: breathing non-labored at rest  Cardiovascular: regular rate and sinus rhythm  Gastrointestinal: soft, non-tender, and non-distended with viable and functional loop ileostomy with enteric material and gas in ostomy bag Musculoskeletal: UE and LE FROM, no edema or wounds, motor and sensation  grossly intact, NT   Labs:  CBC Latest Ref Rng & Units 05/09/2018 05/06/2018 05/05/2018  WBC 3.6 - 11.0 K/uL 4.3 5.8 5.6  Hemoglobin 12.0 - 16.0 g/dL 12.5 13.6 13.6  Hematocrit 35.0 - 47.0 % 36.3 39.5 39.0  Platelets 150 - 440 K/uL 177 179 201   CMP Latest Ref Rng & Units 05/06/2018 05/05/2018 05/04/2018  Glucose 70 - 99 mg/dL 74 83 100(H)  BUN 8 - 23 mg/dL 9 7(L) <5(L)  Creatinine 0.44 - 1.00 mg/dL 1.11(H) 1.00 0.90  Sodium 135 - 145 mmol/L 140 138 141  Potassium 3.5 - 5.1 mmol/L 3.6 3.8 4.0  Chloride 98 - 111 mmol/L 106 105 106  CO2 22 - 32 mmol/L 26 25 26   Calcium 8.9 - 10.3 mg/dL 8.4(L) 8.6(L) 8.7(L)  Total Protein 6.5 - 8.1 g/dL 5.8(L) - -  Total Bilirubin 0.3 - 1.2 mg/dL 1.1 - -  Alkaline Phos 38 - 126 U/L 55 - -  AST 15 - 41 U/L 19 - -  ALT 0 - 44 U/L 7 - -   INR (05/08/2018): 1.67 (from 1.21, 1.41)  Imaging studies: No new pertinent imaging studies   Assessment/Plan: (ICD-10's: K56.52) 67 y.o.femalewithwhat appears to beresolvingcomplete small bowel obstruction, likely attributable to post-surgical adhesions followingpartial sigmoid colectomy with primary anastomosis for diverticular disease,superimposed on chronic parastomal hernia-associated partial SBOandcomplicated by comorbidities including"pre-diabetes", HTN, hypercholesterolemia, CKD, CAD s/p CABG (1991),Coumadin anticoagulation foratrial fibrillation with Left atrial appendage thrombus on CT (04/2017) not visualized on TEE 05/2017) s/p stroke, COPD, Left lung cancer s/p Left upper lobectomy (2017), 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  - continue regular diet and PPI - Coumadin 5 mg  tonight, then resume outpatient 2.5 mg daily - outpatient Coumadin clinic and surgical follow-upto schedule ileostomy reversal - ambulationand smoking cessation stronglyencouraged - medical management  ofcomorbidities  - will discharge home today  All of the above findings and recommendations were discussed with the patient,patient's family,and her RN,and all of patient's and family's questions were answered to their expressed satisfaction.  -- Marilynne Drivers Rosana Hoes, MD, [place]: [facility] - Partnering for exceptional care. Office: [phone]

## 2018-05-09 NOTE — Progress Notes (Signed)
Patient cleared for discharge to home.        Education complete. AVS printed. Discharge instructions given. All questions answered for patient clarification.  Prescriptions given, pharmacy verified.  IV removed.  Discharged to home Via  POV

## 2018-05-09 NOTE — Progress Notes (Signed)
ANTICOAGULATION CONSULT NOTE - Follow up [place] for warfarin dosing  Indication: atrial fibrillation  Allergies  Allergen Reactions  . Penicillin G Hives and Itching    Has patient had a PCN reaction causing immediate rash, facial/tongue/throat swelling, SOB or lightheadedness with hypotension: Yes Has patient had a PCN reaction causing severe rash involving mucus membranes or skin necrosis: No Has patient had a PCN reaction that required hospitalization: No Has patient had a PCN reaction occurring within the last 10 years: Yes If all of the above answers are "NO", then may proceed with Cephalosporin use.  . Succinylcholine     Patient's daughter had emergency surgery and was awake for the entire procedure, was told she should not have succ's and neither should her mom     Patient Measurements: Height: 5\' 4"  (162.6 cm) Weight: 135 lb 2.3 oz (61.3 kg) IBW/kg (Calculated) : 54.7  Vital Signs: Temp: 97.9 F (36.6 C) (07/07 0458) Temp Source: Oral (07/07 0458) BP: 155/72 (07/07 0458) Pulse Rate: 59 (07/07 0458)  Labs: Recent Labs    05/07/18 0453 05/08/18 0554 05/09/18 0449  HGB  --   --  12.5  HCT  --   --  36.3  PLT  --   --  177  LABPROT 15.2 19.6* 23.9*  INR 1.21 1.67 2.16    Estimated Creatinine Clearance: 41.9 mL/min (A) (by C-G formula based on SCr of 1.11 mg/dL (H)).   Assessment: 67 year old female admitted with supratherapeutic INR of 4.89 on 6/26. Patient states she takes warfarin 2.5mg  po daily. 7/2 INR is finally down to 2.24 and Dr Dahlia Byes has requested pharmacy restart warfarin.   Per warfarin clinic notes:  6/19 regimen changed to 2.5 mg MWF and 5 mg all other days (previously on 5 mg daily) 6/26 regimen changed to 2.5 mg daily   Warfarin held for 6 days prior to restarting on 7/2  DATE INR DOSE  7/2 -- 2mg  7/3 1.32 Warfarin consult canceled 7/4 1.41 5 mg 7/5 1.21 5 mg  7/6       1.67     5 mg 7/7       2.16  Goal of  Therapy:  INR 2-3 Monitor platelets by anticoagulation protocol: Yes   Plan:  INR is therapeutic. Order warfarin 2.5mg  tonight.  Pt also on therapeutic enoxaparin for bridging while inpatient.  Follow up INR with am labs. Will need to order CBC at least every three days per policy.  Pharmacy will continue to follow.   Nathania Waldman K, RPH 05/09/2018,7:57 AM

## 2018-05-11 NOTE — Telephone Encounter (Signed)
Patient is coming in tomorrow for coumadin check.  She also needs to have f/u with Dr End.  Routing to scheduling to please call and schedule her to see Dr End or APP at first available.

## 2018-05-12 ENCOUNTER — Telehealth: Payer: Self-pay | Admitting: Pharmacist

## 2018-05-12 ENCOUNTER — Ambulatory Visit (INDEPENDENT_AMBULATORY_CARE_PROVIDER_SITE_OTHER): Payer: Medicare Other | Admitting: Pharmacist

## 2018-05-12 ENCOUNTER — Encounter: Payer: Self-pay | Admitting: Surgery

## 2018-05-12 ENCOUNTER — Ambulatory Visit (INDEPENDENT_AMBULATORY_CARE_PROVIDER_SITE_OTHER): Payer: Medicare Other | Admitting: Surgery

## 2018-05-12 ENCOUNTER — Telehealth: Payer: Self-pay

## 2018-05-12 ENCOUNTER — Telehealth: Payer: Self-pay | Admitting: Cardiovascular Disease

## 2018-05-12 VITALS — BP 147/84 | HR 108 | Ht 62.0 in | Wt 131.0 lb

## 2018-05-12 DIAGNOSIS — Z932 Ileostomy status: Secondary | ICD-10-CM

## 2018-05-12 DIAGNOSIS — K435 Parastomal hernia without obstruction or  gangrene: Secondary | ICD-10-CM | POA: Diagnosis not present

## 2018-05-12 DIAGNOSIS — I513 Intracardiac thrombosis, not elsewhere classified: Secondary | ICD-10-CM

## 2018-05-12 DIAGNOSIS — I480 Paroxysmal atrial fibrillation: Secondary | ICD-10-CM

## 2018-05-12 DIAGNOSIS — Z5181 Encounter for therapeutic drug level monitoring: Secondary | ICD-10-CM

## 2018-05-12 DIAGNOSIS — I2510 Atherosclerotic heart disease of native coronary artery without angina pectoris: Secondary | ICD-10-CM

## 2018-05-12 LAB — POCT INR: INR: 3.9 — AB (ref 2.0–3.0)

## 2018-05-12 MED ORDER — ENOXAPARIN SODIUM 60 MG/0.6ML ~~LOC~~ SOLN: 60.0000 mg | Freq: Two times a day (BID) | SUBCUTANEOUS | 1 refills | Status: DC

## 2018-05-12 NOTE — H&P (View-Only) (Signed)
05/12/2018  History of Present Illness: Carly Khan is a 67 y.o. female who presents for follow up of small bowel obstruction.  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  She was admitted on 6/27 and discharged on 7/7.  She presents for follow up.  Currently she's doing well and without any obstructive symptoms.  She's taking her coumadin as indicated and just had an INR check which was supratherapeutic at 3.9.  Denies any fevers, chills, chest pain, shortness of breath, nausea, vomiting.  We have discussed in the past plans for ileostomy takedown and lysis of adhesions and she has finally gone through all the necessary workup including appointments with ENT for a possible parotid mass, GI for colonoscopy, Oncology for follow up of left lung cancer s/p left upper lobectomy, and cardiology for anticoagulation regimen for an atrial thrombus.    Past Medical History: Past Medical History:  Diagnosis Date  . CAD in native artery 1991   a. s/p CABG in 1991 in [place]; b. 06/2017 MV: EF 72%, no ischemia/infarct-->Low risk.  . Cancer of left lung ([place]) 2016   a. 12/2015 LUL lung resection.  . Chronic Dyspnea on exertion   . CKD (chronic kidney disease) stage 3, GFR 30-59 ml/min ([place]) 04/09/2017   a. 04/2017 ARF & Hyperkalemia req HD x 1.  . 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 Diverticulitis   . Diverticulitis   . Dyspnea   . Essential hypertension 04/09/2017  . Family history of adverse reaction to anesthesia   . History of sebaceous cyst 02/28/2017  . History of stress test   . Intermittent vertigo 04/09/2017  . Itching 02/28/2017  . Left Atrial Appendage Thrombus    a. Incidentally noted  on CT 04/2017;  b. 04/2017 Echo: EF 65-70%, Gr1 DD, no LA filling defect seen; c. 05/2017 TEE: EF 60-65%, no RA/LA thrombus; d. Later found to have PAF on Event monitor 06/2017-->initially placed on eliquis; changed to coumadin 2/2 $.  . PAF (paroxysmal atrial fibrillation) ([place])    a. Discovered 06/2017 on Event monitor as part of w/u for LAA thrombus. CHA2DS2VASc = 4-->Eliquis.  . Stroke [facility])    a. Pt reports prior h/o CVA; b. 07/2017 Head CT: Chronic atrophy and small vessel ischemic changes. No acute intracranial abnormalities.     Past Surgical History: Past Surgical History:  Procedure Laterality Date  . ABDOMINAL HYSTERECTOMY    . cardiac bypass  1990  . carpel tunnel    . CHOLECYSTECTOMY    . COLON SURGERY    . COLONOSCOPY    . COLONOSCOPY WITH PROPOFOL N/A 03/03/2018   Procedure: COLONOSCOPY WITH PROPOFOL  ileostomy takedown;  Surgeon: Lin Landsman, MD;  Location: [place];  Service: Gastroenterology;  Laterality: N/A;  . CORONARY ARTERY BYPASS GRAFT  1990's   x 4  . CT guided needle placement    . ESOPHAGOGASTRODUODENOSCOPY (EGD) WITH PROPOFOL  03/03/2018   Procedure: ESOPHAGOGASTRODUODENOSCOPY (EGD) WITH PROPOFOL;  Surgeon: Lin Landsman, MD;  Location: [facility];  Service: Gastroenterology;;  . EXPLORATORY LAPAROTOMY    . GALLBLADDER SURGERY    . loop ileostomy    . SIGMOID RESECTION / RECTOPEXY    . TEE WITHOUT CARDIOVERSION N/A  05/04/2017   Procedure: TRANSESOPHAGEAL ECHOCARDIOGRAM (TEE);  Surgeon: Wellington Hampshire, MD;  Location: [facility] ORS;  Service: Cardiovascular;  Laterality: N/A;  . THORACOSCOPY    . vaginectomy      Home Medications: Prior to Admission medications   Medication Sig Start Date End Date Taking? Authorizing Provider  acetaminophen (TYLENOL) 500 MG tablet Take 500 mg by mouth every 6 (six) hours as needed (for pain).     [provider]  albuterol (PROVENTIL HFA;VENTOLIN HFA) 108 (90 Base) MCG/ACT inhaler Inhale 2 puffs into  the lungs every 6 (six) hours as needed for wheezing or shortness of breath.    [provider]  BREO ELLIPTA 100-25 MCG/INH AEPB Inhale 1 puff into the lungs 2 (two) times daily.    [provider]  metoprolol succinate (TOPROL-XL) 25 MG 24 hr tablet Take 1 tablet (25 mg total) by mouth daily. Patient taking differently: Take 25 mg by mouth at bedtime.  01/04/18 01/04/19  Bettey Costa, MD  omeprazole (PRILOSEC) 20 MG capsule Take 1 capsule (20 mg total) by mouth 2 (two) times daily before a meal. 03/03/18 08/30/18  Vanga, Tally Due, MD  pravastatin (PRAVACHOL) 10 MG tablet Take 10 mg by mouth at bedtime.     [provider]  warfarin (COUMADIN) 5 MG tablet Take 1 tablet (5 mg total) by mouth daily at 6 PM. Take 1 tablet daily Patient taking differently: Take 2.5 mg by mouth daily at 6 PM. Take 1 tablet daily 04/14/18   End, Harrell Gave, MD    Allergies: Allergies  Allergen Reactions  . Penicillin G Hives and Itching    Has patient had a PCN reaction causing immediate rash, facial/tongue/throat swelling, SOB or lightheadedness with hypotension: Yes Has patient had a PCN reaction causing severe rash involving mucus membranes or skin necrosis: No Has patient had a PCN reaction that required hospitalization: No Has patient had a PCN reaction occurring within the last 10 years: Yes If all of the above answers are "NO", then may proceed with Cephalosporin use.  . Succinylcholine     Patient's daughter had emergency surgery and was awake for the entire procedure, was told she should not have succ's and neither should her mom    Review of Systems: Review of Systems  Constitutional: Negative for chills and fever.  Respiratory: Negative for shortness of breath.   Cardiovascular: Negative for chest pain.  Gastrointestinal: Negative for abdominal pain, constipation, diarrhea, nausea and vomiting.    Physical Exam BP (!) 147/84   Pulse (!) 108   Ht 5\' 2"  (1.575 m)   Wt  131 lb (59.4 kg)   BMI 23.96 kg/m  CONSTITUTIONAL: No acute distress HEENT:  Normocephalic, atraumatic, extraocular motion intact, poor dentition. NECK: Trachea is midline, and there is no jugular venous distension.  RESPIRATORY:  Lungs are clear, and breath sounds are equal bilaterally. Normal respiratory effort without pathologic use of accessory muscles. CARDIOVASCULAR: Heart is regular without murmurs, gallops, or rubs. GI: The abdomen is soft, nondistended, nontender to palpation.  Right lower quadrant loop ileostomy viable with stool and gas in bag. Patient has a reducible parastomal hernia.  She also has a midline ventral hernia that is reducible and nontender.  MUSCULOSKELETAL:  Normal muscle strength and tone in all four extremities.  No peripheral edema or cyanosis. SKIN: Skin turgor is normal. There are no pathologic skin lesions.  NEUROLOGIC:  Motor and sensation is grossly normal.  Cranial nerves are grossly intact. PSYCH:  Alert and  oriented to person, place and time. Affect is normal.  Labs/Imaging: INR today 3.9. Labs from recent admission:  7/7 WBC 4.3, Hgb 12.5, hct 36.3, Plt 177  Assessment and Plan: This is a 67 y.o. female who presents for follow up of small bowel obstruction due to a parastomal hernia and adhesions proximal to the stoma.    Discussed with the patient that we're ready for her surgery as she has completed all the needed workup.  Plans would be for an ileostomy takedown with lysis of adhesions.  Discussed with the patient that we would attempt this all from the ostomy site, but may need a midline incision if unable to free up the adhesions safely.  At this point would try not to deal with her midline hernia as I suspect she would need mesh placement and do not want it to get infected by combining with her ostomy takedown.  We would deal with the ventral hernia in the future instead.  Risks of bleeding, infection, and injury to surrounding structures were  discussed.  Post-op hospital stay, outcomes, and restrictions were discussed with the patient.  Will call Dr. Darnelle Bos office and send him a message to discuss her Coumadin and if she needs bridging prior to her surgery or not.  Hopefully we can schedule her surgery for 7/16.  Face-to-face time spent with the patient and care providers was 25 minutes, with more than 50% of the time spent counseling, educating, and coordinating care of the patient.     Melvyn Neth, [facility]

## 2018-05-12 NOTE — Telephone Encounter (Signed)
Dr. Olean Ree calling needing to know since she is on coumadin what recommendations do we have.      [facility] Pre-operative Risk Assessment    Request for surgical clearance:  1. What type of surgery is being performed? Ileostomy takedown   2. When is this surgery scheduled? 05/18/18  3. What type of clearance is required (medical clearance vs. Pharmacy clearance to hold med vs. Both)? Asking about patient's coumadin   4. Are there any medications that need to be held prior to surgery and how long? Not sure if patient will need bridge    5. Practice name and name of physician performing surgery? Dr. Jacqulyn Bath Piscoya   6. What is your office phone number [phone] cell [phone] office   7.   What is your office fax number [phone]  8.   Anesthesia type (None, local, MAC, general) ? General

## 2018-05-12 NOTE — Progress Notes (Signed)
05/12/2018  History of Present Illness: Carly Khan is a 67 y.o. female who presents for follow up of small bowel obstruction.  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  She was admitted on 6/27 and discharged on 7/7.  She presents for follow up.  Currently she's doing well and without any obstructive symptoms.  She's taking her coumadin as indicated and just had an INR check which was supratherapeutic at 3.9.  Denies any fevers, chills, chest pain, shortness of breath, nausea, vomiting.  We have discussed in the past plans for ileostomy takedown and lysis of adhesions and she has finally gone through all the necessary workup including appointments with ENT for a possible parotid mass, GI for colonoscopy, Oncology for follow up of left lung cancer s/p left upper lobectomy, and cardiology for anticoagulation regimen for an atrial thrombus.    Past Medical History: Past Medical History:  Diagnosis Date  . CAD in native artery 1991   a. s/p CABG in 1991 in [place]; b. 06/2017 MV: EF 72%, no ischemia/infarct-->Low risk.  . Cancer of left lung ([place]) 2016   a. 12/2015 LUL lung resection.  . Chronic Dyspnea on exertion   . CKD (chronic kidney disease) stage 3, GFR 30-59 ml/min ([place]) 04/09/2017   a. 04/2017 ARF & Hyperkalemia req HD x 1.  . 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 Diverticulitis   . Diverticulitis   . Dyspnea   . Essential hypertension 04/09/2017  . Family history of adverse reaction to anesthesia   . History of sebaceous cyst 02/28/2017  . History of stress test   . Intermittent vertigo 04/09/2017  . Itching 02/28/2017  . Left Atrial Appendage Thrombus    a. Incidentally noted  on CT 04/2017;  b. 04/2017 Echo: EF 65-70%, Gr1 DD, no LA filling defect seen; c. 05/2017 TEE: EF 60-65%, no RA/LA thrombus; d. Later found to have PAF on Event monitor 06/2017-->initially placed on eliquis; changed to coumadin 2/2 $.  . PAF (paroxysmal atrial fibrillation) ([place])    a. Discovered 06/2017 on Event monitor as part of w/u for LAA thrombus. CHA2DS2VASc = 4-->Eliquis.  . Stroke [facility])    a. Pt reports prior h/o CVA; b. 07/2017 Head CT: Chronic atrophy and small vessel ischemic changes. No acute intracranial abnormalities.     Past Surgical History: Past Surgical History:  Procedure Laterality Date  . ABDOMINAL HYSTERECTOMY    . cardiac bypass  1990  . carpel tunnel    . CHOLECYSTECTOMY    . COLON SURGERY    . COLONOSCOPY    . COLONOSCOPY WITH PROPOFOL N/A 03/03/2018   Procedure: COLONOSCOPY WITH PROPOFOL  ileostomy takedown;  Surgeon: Lin Landsman, MD;  Location: [facility];  Service: Gastroenterology;  Laterality: N/A;  . CORONARY ARTERY BYPASS GRAFT  1990's   x 4  . CT guided needle placement    . ESOPHAGOGASTRODUODENOSCOPY (EGD) WITH PROPOFOL  03/03/2018   Procedure: ESOPHAGOGASTRODUODENOSCOPY (EGD) WITH PROPOFOL;  Surgeon: Lin Landsman, MD;  Location: [facility];  Service: Gastroenterology;;  . EXPLORATORY LAPAROTOMY    . GALLBLADDER SURGERY    . loop ileostomy    . SIGMOID RESECTION / RECTOPEXY    . TEE WITHOUT CARDIOVERSION N/A  05/04/2017   Procedure: TRANSESOPHAGEAL ECHOCARDIOGRAM (TEE);  Surgeon: Wellington Hampshire, MD;  Location: [facility] ORS;  Service: Cardiovascular;  Laterality: N/A;  . THORACOSCOPY    . vaginectomy      Home Medications: Prior to Admission medications   Medication Sig Start Date End Date Taking? Authorizing Provider  acetaminophen (TYLENOL) 500 MG tablet Take 500 mg by mouth every 6 (six) hours as needed (for pain).     [provider]  albuterol (PROVENTIL HFA;VENTOLIN HFA) 108 (90 Base) MCG/ACT inhaler Inhale 2 puffs into  the lungs every 6 (six) hours as needed for wheezing or shortness of breath.    [provider]  BREO ELLIPTA 100-25 MCG/INH AEPB Inhale 1 puff into the lungs 2 (two) times daily.    [provider]  metoprolol succinate (TOPROL-XL) 25 MG 24 hr tablet Take 1 tablet (25 mg total) by mouth daily. Patient taking differently: Take 25 mg by mouth at bedtime.  01/04/18 01/04/19  Bettey Costa, MD  omeprazole (PRILOSEC) 20 MG capsule Take 1 capsule (20 mg total) by mouth 2 (two) times daily before a meal. 03/03/18 08/30/18  Vanga, Tally Due, MD  pravastatin (PRAVACHOL) 10 MG tablet Take 10 mg by mouth at bedtime.     [provider]  warfarin (COUMADIN) 5 MG tablet Take 1 tablet (5 mg total) by mouth daily at 6 PM. Take 1 tablet daily Patient taking differently: Take 2.5 mg by mouth daily at 6 PM. Take 1 tablet daily 04/14/18   End, Harrell Gave, MD    Allergies: Allergies  Allergen Reactions  . Penicillin G Hives and Itching    Has patient had a PCN reaction causing immediate rash, facial/tongue/throat swelling, SOB or lightheadedness with hypotension: Yes Has patient had a PCN reaction causing severe rash involving mucus membranes or skin necrosis: No Has patient had a PCN reaction that required hospitalization: No Has patient had a PCN reaction occurring within the last 10 years: Yes If all of the above answers are "NO", then may proceed with Cephalosporin use.  . Succinylcholine     Patient's daughter had emergency surgery and was awake for the entire procedure, was told she should not have succ's and neither should her mom    Review of Systems: Review of Systems  Constitutional: Negative for chills and fever.  Respiratory: Negative for shortness of breath.   Cardiovascular: Negative for chest pain.  Gastrointestinal: Negative for abdominal pain, constipation, diarrhea, nausea and vomiting.    Physical Exam BP (!) 147/84   Pulse (!) 108   Ht 5\' 2"  (1.575 m)   Wt  131 lb (59.4 kg)   BMI 23.96 kg/m  CONSTITUTIONAL: No acute distress HEENT:  Normocephalic, atraumatic, extraocular motion intact, poor dentition. NECK: Trachea is midline, and there is no jugular venous distension.  RESPIRATORY:  Lungs are clear, and breath sounds are equal bilaterally. Normal respiratory effort without pathologic use of accessory muscles. CARDIOVASCULAR: Heart is regular without murmurs, gallops, or rubs. GI: The abdomen is soft, nondistended, nontender to palpation.  Right lower quadrant loop ileostomy viable with stool and gas in bag. Patient has a reducible parastomal hernia.  She also has a midline ventral hernia that is reducible and nontender.  MUSCULOSKELETAL:  Normal muscle strength and tone in all four extremities.  No peripheral edema or cyanosis. SKIN: Skin turgor is normal. There are no pathologic skin lesions.  NEUROLOGIC:  Motor and sensation is grossly normal.  Cranial nerves are grossly intact. PSYCH:  Alert and  oriented to person, place and time. Affect is normal.  Labs/Imaging: INR today 3.9. Labs from recent admission:  7/7 WBC 4.3, Hgb 12.5, hct 36.3, Plt 177  Assessment and Plan: This is a 67 y.o. female who presents for follow up of small bowel obstruction due to a parastomal hernia and adhesions proximal to the stoma.    Discussed with the patient that we're ready for her surgery as she has completed all the needed workup.  Plans would be for an ileostomy takedown with lysis of adhesions.  Discussed with the patient that we would attempt this all from the ostomy site, but may need a midline incision if unable to free up the adhesions safely.  At this point would try not to deal with her midline hernia as I suspect she would need mesh placement and do not want it to get infected by combining with her ostomy takedown.  We would deal with the ventral hernia in the future instead.  Risks of bleeding, infection, and injury to surrounding structures were  discussed.  Post-op hospital stay, outcomes, and restrictions were discussed with the patient.  Will call Dr. Darnelle Bos office and send him a message to discuss her Coumadin and if she needs bridging prior to her surgery or not.  Hopefully we can schedule her surgery for 7/16.  Face-to-face time spent with the patient and care providers was 25 minutes, with more than 50% of the time spent counseling, educating, and coordinating care of the patient.     Melvyn Neth, [facility]

## 2018-05-12 NOTE — Telephone Encounter (Signed)
Called patient to let her know that her Pre-Admit will be on 05/13/2018 at 2:30 PM. However, patient stated that she would not be able to go to that appointment due to transportation problems. I then gave patient the number to call Pre-Admit and reschedule with them. Patient agreed and had no further questions.

## 2018-05-12 NOTE — Telephone Encounter (Signed)
Tried to reach patient to bring for instructions for procedure. Unable to reach as VM box not set up. LM on daughter's VM.   Patient with diagnosis of Afib with CVA and atrial thrombus on warfarin for anticoagulation.    Procedure: ileostomy takedown Date of procedure: 05/18/18  CHADS2-VASc score of at least 6 (CHF, HTN, AGE, DM2, stroke/tia x 2, CAD, AGE, female)  Per office protocol, patient can hold warfarin for  5  days prior to procedure [place].     I have been unable to reach pt to bring back into office to go over lovenox instructions. Will notify provider.

## 2018-05-12 NOTE — Patient Instructions (Signed)
Description   Skip warfarin today then take 1/2 tablet daily. Do not refill the 5mg  tablets - we will call a smaller tablet strength to the pharmacy at next visit.  Check INR in 1 week.

## 2018-05-12 NOTE — Telephone Encounter (Signed)
See phone note from 05/12/18 (lovenox instructions for dosing instructions).   Will forward clearance to provider.

## 2018-05-12 NOTE — Telephone Encounter (Signed)
Spoke with Daughter and went over instructions below. She has done lovenox injections before and will be giving patient the injection. She feels comfortable with doing this without reeducation. Daughter read back dose instructions and states understanding. Pt will be in hospital for several days after procedure more than likely. Did give instructions in case sent home. RX sent to [facility] as per request. Advised to order today as they may need to order medication.    Crcl 54ml/min, weight 59kg     05/12/18: No Coumadin due to elevated INR.   05/13/18: No Coumadin or Lovenox.  05/14/18: Inject Lovenox 60mg  in the fatty abdominal tissue at least 2 inches from the belly button twice a day about 12 hours apart, 9am and 9pm rotate sites. No Coumadin.  05/15/18: Inject Lovenox in the fatty tissue every 12 hours, 9am and 9pm. No Coumadin.  05/16/18: Inject Lovenox in the fatty tissue every 12 hours, 9am and 9pm. No Coumadin.  05/17/18: Inject Lovenox in the fatty tissue in the morning at 9 am (No PM dose). No Coumadin.  05/18/18: Procedure Day - No Lovenox - Resume Coumadin in the evening or as directed by doctor (take an extra half tablet with usual dose for 2 days then resume normal dose).  05/19/18: Resume Lovenox inject in the fatty tissue every 12 hours and take Coumadin.  05/20/18: Inject Lovenox in the fatty tissue every 12 hours and take Coumadin.  05/21/18: Inject Lovenox in the fatty tissue every 12 hours and take Coumadin.  05/22/18: Inject Lovenox in the fatty tissue every 12 hours and take Coumadin.  05/23/18: Inject Lovenox in the fatty tissue every 12 hours and take Coumadin.  05/24/18: Coumadin appt to check INR.

## 2018-05-12 NOTE — Addendum Note (Signed)
Addended by: Riki Sheer on: 05/12/2018 01:03 PM   Modules accepted: Orders, SmartSet

## 2018-05-12 NOTE — Patient Instructions (Signed)
You have chosen to have an Ileostomy takedown on 05/18/2018 at [facility] by Dr. Olean Ree.  Please read your blue sheet in case you have any questions about your surgery.

## 2018-05-13 ENCOUNTER — Inpatient Hospital Stay: Admission: RE | Admit: 2018-05-13 | Payer: Medicare Other | Source: Ambulatory Visit

## 2018-05-14 ENCOUNTER — Telehealth: Payer: Self-pay | Admitting: Surgery

## 2018-05-14 ENCOUNTER — Telehealth: Payer: Self-pay

## 2018-05-14 ENCOUNTER — Inpatient Hospital Stay: Payer: Self-pay | Admitting: Surgery

## 2018-05-14 NOTE — Telephone Encounter (Signed)
Called patient to let her know that since missed her pre-admit appointment yesterday, I wanted to let her know that she had options and she stated that it would be easier for her to come in 2 hours prior to her surgery. I then called Caryl Pina from [facility] to let them know what the patient agreed on.

## 2018-05-14 NOTE — Telephone Encounter (Signed)
Pt advised of pre op date/time and sx date. Sx: 05/18/18 with Dr Hampton Abbot and Dr Genevive Bi- Ileostomy takedown.  Pre op: arrive the day of surgery 2 hours.   Patient made aware to call [phone], between 1-3:00pm the day before surgery, to find out what time to arrive.

## 2018-05-14 NOTE — Telephone Encounter (Signed)
Lmov for patient to call and schedule appointment

## 2018-05-17 ENCOUNTER — Telehealth: Payer: Self-pay

## 2018-05-17 ENCOUNTER — Telehealth: Payer: Self-pay | Admitting: Surgery

## 2018-05-17 MED ORDER — CIPROFLOXACIN IN D5W 400 MG/200ML IV SOLN
400.0000 mg | INTRAVENOUS | Status: AC
  Administered 2018-05-18: 400 mg via INTRAVENOUS

## 2018-05-17 MED ORDER — METRONIDAZOLE IN NACL 5-0.79 MG/ML-% IV SOLN
500.0000 mg | INTRAVENOUS | Status: AC
  Administered 2018-05-18: 500 mg via INTRAVENOUS
  Filled 2018-05-17: qty 100

## 2018-05-17 NOTE — Telephone Encounter (Signed)
Carly Khan from [facility] asking for the cardiac clearance. They have the pharmacy part just need the provider to give okay     Please advise   Surgery is tomorrow

## 2018-05-17 NOTE — Telephone Encounter (Signed)
Please have Carly Khan see [name] note from April.  Patient cleared at that time.  As long as no new symptoms have arisen, patient is ok to proceed with surgery.  Anticoagulation should be followed as outlines in [place] Auten's note.  [name], MD [facility] Pager: [phone]

## 2018-05-17 NOTE — Telephone Encounter (Signed)
We have received patients medical clearance from Dr. Marius Ditch patient has received GI clearance for surgery.

## 2018-05-17 NOTE — Telephone Encounter (Signed)
One of the nurses from Pre-Admit called stating that patient's Potassium and Creatinine were elevated. Therefore, they were requesting Medical Clearance. I told the nurse that Dr. Rosana Hoes was aware and that's why we had called her to let her know that she needed a lab repeat to make sure. Once we received results, I informed Dr. Rosana Hoes and he recommended for the patient to go to the ED because she could probably be dehydrated. When I spoke with Dr. Rosana Hoes, I told him that Anesthesia was requesting medical clearance. Dr. Rosana Hoes stated that he would get in contact with the anesthesiologist in reference to the medical clearance requested since he did not think patient needed one.

## 2018-05-17 NOTE — Telephone Encounter (Signed)
Routing to Dr End. 

## 2018-05-17 NOTE — Telephone Encounter (Signed)
This will be faxed to Pre-Admit.

## 2018-05-18 ENCOUNTER — Inpatient Hospital Stay: Payer: Medicare Other | Admitting: Certified Registered Nurse Anesthetist

## 2018-05-18 ENCOUNTER — Encounter: Payer: Self-pay | Admitting: *Deleted

## 2018-05-18 ENCOUNTER — Other Ambulatory Visit: Payer: Self-pay

## 2018-05-18 ENCOUNTER — Inpatient Hospital Stay
Admission: RE | Admit: 2018-05-18 | Discharge: 2018-05-21 | DRG: 331 | Disposition: A | Discharge status: Home / Self Care | Payer: Medicare Other | Source: Ambulatory Visit | Attending: Surgery | Admitting: Surgery

## 2018-05-18 ENCOUNTER — Encounter
Admission: RE | Disposition: A | Discharge status: Home / Self Care | Payer: Self-pay | Source: Ambulatory Visit | Attending: Surgery

## 2018-05-18 DIAGNOSIS — I2510 Atherosclerotic heart disease of native coronary artery without angina pectoris: Secondary | ICD-10-CM | POA: Diagnosis present

## 2018-05-18 DIAGNOSIS — Z888 Allergy status to other drugs, medicaments and biological substances status: Secondary | ICD-10-CM

## 2018-05-18 DIAGNOSIS — K433 Parastomal hernia with obstruction, without gangrene: Secondary | ICD-10-CM | POA: Diagnosis not present

## 2018-05-18 DIAGNOSIS — Z9049 Acquired absence of other specified parts of digestive tract: Secondary | ICD-10-CM | POA: Diagnosis not present

## 2018-05-18 DIAGNOSIS — Z932 Ileostomy status: Secondary | ICD-10-CM

## 2018-05-18 DIAGNOSIS — Z7951 Long term (current) use of inhaled steroids: Secondary | ICD-10-CM

## 2018-05-18 DIAGNOSIS — Z951 Presence of aortocoronary bypass graft: Secondary | ICD-10-CM | POA: Diagnosis not present

## 2018-05-18 DIAGNOSIS — Z85118 Personal history of other malignant neoplasm of bronchus and lung: Secondary | ICD-10-CM

## 2018-05-18 DIAGNOSIS — Z880 Allergy status to penicillin: Secondary | ICD-10-CM | POA: Diagnosis not present

## 2018-05-18 DIAGNOSIS — Z7901 Long term (current) use of anticoagulants: Secondary | ICD-10-CM | POA: Diagnosis not present

## 2018-05-18 DIAGNOSIS — N183 Chronic kidney disease, stage 3 (moderate): Secondary | ICD-10-CM | POA: Diagnosis present

## 2018-05-18 DIAGNOSIS — I480 Paroxysmal atrial fibrillation: Secondary | ICD-10-CM | POA: Diagnosis present

## 2018-05-18 DIAGNOSIS — Z90710 Acquired absence of both cervix and uterus: Secondary | ICD-10-CM | POA: Diagnosis not present

## 2018-05-18 DIAGNOSIS — K435 Parastomal hernia without obstruction or  gangrene: Secondary | ICD-10-CM | POA: Diagnosis present

## 2018-05-18 DIAGNOSIS — Z8673 Personal history of transient ischemic attack (TIA), and cerebral infarction without residual deficits: Secondary | ICD-10-CM | POA: Diagnosis not present

## 2018-05-18 DIAGNOSIS — K660 Peritoneal adhesions (postprocedural) (postinfection): Secondary | ICD-10-CM | POA: Diagnosis present

## 2018-05-18 DIAGNOSIS — Z432 Encounter for attention to ileostomy: Secondary | ICD-10-CM | POA: Diagnosis present

## 2018-05-18 DIAGNOSIS — I129 Hypertensive chronic kidney disease with stage 1 through stage 4 chronic kidney disease, or unspecified chronic kidney disease: Secondary | ICD-10-CM | POA: Diagnosis present

## 2018-05-18 DIAGNOSIS — Z9889 Other specified postprocedural states: Secondary | ICD-10-CM

## 2018-05-18 HISTORY — PX: PARASTOMAL HERNIA REPAIR: SHX2162

## 2018-05-18 HISTORY — PX: ILEOSTOMY CLOSURE: SHX1784

## 2018-05-18 LAB — PROTIME-INR
INR: 1.07
Prothrombin Time: 13.8 seconds (ref 11.4–15.2)

## 2018-05-18 SURGERY — CLOSURE, ILEOSTOMY
Anesthesia: General | Laterality: Right

## 2018-05-18 MED ORDER — PHENYLEPHRINE HCL 10 MG/ML IJ SOLN
INTRAMUSCULAR | Status: DC | PRN
  Administered 2018-05-18 (×2): 50 ug via INTRAVENOUS

## 2018-05-18 MED ORDER — FENTANYL CITRATE (PF) 100 MCG/2ML IJ SOLN
INTRAMUSCULAR | Status: DC | PRN
  Administered 2018-05-18: 75 ug via INTRAVENOUS
  Administered 2018-05-18: 175 ug via INTRAVENOUS

## 2018-05-18 MED ORDER — DEXAMETHASONE SODIUM PHOSPHATE 10 MG/ML IJ SOLN
INTRAMUSCULAR | Status: DC | PRN
  Administered 2018-05-18: 5 mg via INTRAVENOUS

## 2018-05-18 MED ORDER — FLUTICASONE FUROATE-VILANTEROL 100-25 MCG/INH IN AEPB
1.0000 | INHALATION_SPRAY | Freq: Two times a day (BID) | RESPIRATORY_TRACT | Status: DC
  Administered 2018-05-18 – 2018-05-21 (×6): 1 via RESPIRATORY_TRACT
  Filled 2018-05-18: qty 28

## 2018-05-18 MED ORDER — FAMOTIDINE IN NACL 20-0.9 MG/50ML-% IV SOLN
20.0000 mg | Freq: Two times a day (BID) | INTRAVENOUS | Status: DC
  Administered 2018-05-18 – 2018-05-19 (×3): 20 mg via INTRAVENOUS
  Filled 2018-05-18 (×5): qty 50

## 2018-05-18 MED ORDER — BUPIVACAINE LIPOSOME 1.3 % IJ SUSP
INTRAMUSCULAR | Status: DC | PRN
  Administered 2018-05-18: 50 mL

## 2018-05-18 MED ORDER — KETOROLAC TROMETHAMINE 30 MG/ML IJ SOLN
15.0000 mg | Freq: Four times a day (QID) | INTRAMUSCULAR | Status: DC | PRN
  Administered 2018-05-18 – 2018-05-21 (×3): 15 mg via INTRAVENOUS
  Filled 2018-05-18 (×3): qty 1

## 2018-05-18 MED ORDER — PHENYLEPHRINE HCL 10 MG/ML IJ SOLN
INTRAMUSCULAR | Status: AC
  Filled 2018-05-18: qty 1

## 2018-05-18 MED ORDER — ROCURONIUM BROMIDE 100 MG/10ML IV SOLN
INTRAVENOUS | Status: DC | PRN
  Administered 2018-05-18: 20 mg via INTRAVENOUS
  Administered 2018-05-18: 50 mg via INTRAVENOUS
  Administered 2018-05-18: 10 mg via INTRAVENOUS

## 2018-05-18 MED ORDER — LIDOCAINE HCL (CARDIAC) PF 100 MG/5ML IV SOSY
PREFILLED_SYRINGE | INTRAVENOUS | Status: DC | PRN
  Administered 2018-05-18: 100 mg via INTRAVENOUS

## 2018-05-18 MED ORDER — PROPOFOL 10 MG/ML IV BOLUS
INTRAVENOUS | Status: DC | PRN
  Administered 2018-05-18: 150 mg via INTRAVENOUS
  Administered 2018-05-18: 50 mg via INTRAVENOUS

## 2018-05-18 MED ORDER — ACETAMINOPHEN 10 MG/ML IV SOLN
INTRAVENOUS | Status: DC | PRN
  Administered 2018-05-18: 1000 mg via INTRAVENOUS

## 2018-05-18 MED ORDER — GABAPENTIN 300 MG PO CAPS
300.0000 mg | ORAL_CAPSULE | ORAL | Status: AC
  Administered 2018-05-18: 300 mg via ORAL

## 2018-05-18 MED ORDER — DEXAMETHASONE SODIUM PHOSPHATE 10 MG/ML IJ SOLN
INTRAMUSCULAR | Status: AC
  Filled 2018-05-18: qty 1

## 2018-05-18 MED ORDER — CHLORHEXIDINE GLUCONATE CLOTH 2 % EX PADS: 6.0000 | MEDICATED_PAD | Freq: Once | CUTANEOUS | Status: DC

## 2018-05-18 MED ORDER — ROCURONIUM BROMIDE 50 MG/5ML IV SOLN
INTRAVENOUS | Status: AC
  Filled 2018-05-18: qty 1

## 2018-05-18 MED ORDER — ONDANSETRON 4 MG PO TBDP: 4.0000 mg | ORAL_TABLET | Freq: Four times a day (QID) | ORAL | Status: DC | PRN

## 2018-05-18 MED ORDER — ONDANSETRON HCL 4 MG/2ML IJ SOLN
INTRAMUSCULAR | Status: AC
  Filled 2018-05-18: qty 2

## 2018-05-18 MED ORDER — SUGAMMADEX SODIUM 200 MG/2ML IV SOLN
INTRAVENOUS | Status: AC
  Filled 2018-05-18: qty 2

## 2018-05-18 MED ORDER — MIDAZOLAM HCL 2 MG/2ML IJ SOLN
INTRAMUSCULAR | Status: AC
  Filled 2018-05-18: qty 2

## 2018-05-18 MED ORDER — SUGAMMADEX SODIUM 200 MG/2ML IV SOLN
INTRAVENOUS | Status: DC | PRN
  Administered 2018-05-18: 200 mg via INTRAVENOUS

## 2018-05-18 MED ORDER — HYDROMORPHONE HCL 1 MG/ML IJ SOLN: 0.5000 mg | INTRAMUSCULAR | Status: DC | PRN

## 2018-05-18 MED ORDER — ACETAMINOPHEN 10 MG/ML IV SOLN
INTRAVENOUS | Status: AC
  Filled 2018-05-18: qty 100

## 2018-05-18 MED ORDER — MIDAZOLAM HCL 2 MG/2ML IJ SOLN
INTRAMUSCULAR | Status: DC | PRN
  Administered 2018-05-18: 1 mg via INTRAVENOUS

## 2018-05-18 MED ORDER — BUPIVACAINE LIPOSOME 1.3 % IJ SUSP
20.0000 mL | Freq: Once | INTRAMUSCULAR | Status: DC
  Filled 2018-05-18: qty 20

## 2018-05-18 MED ORDER — LIDOCAINE HCL (PF) 2 % IJ SOLN
INTRAMUSCULAR | Status: AC
  Filled 2018-05-18: qty 10

## 2018-05-18 MED ORDER — ALBUTEROL SULFATE HFA 108 (90 BASE) MCG/ACT IN AERS
INHALATION_SPRAY | RESPIRATORY_TRACT | Status: DC | PRN
  Administered 2018-05-18: 4 via RESPIRATORY_TRACT

## 2018-05-18 MED ORDER — FENTANYL CITRATE (PF) 100 MCG/2ML IJ SOLN: 25.0000 ug | INTRAMUSCULAR | Status: DC | PRN

## 2018-05-18 MED ORDER — POLYETHYLENE GLYCOL 3350 17 G PO PACK: 17.0000 g | PACK | Freq: Every day | ORAL | Status: DC | PRN

## 2018-05-18 MED ORDER — ACETAMINOPHEN 500 MG PO TABS
1000.0000 mg | ORAL_TABLET | ORAL | Status: AC
  Administered 2018-05-18: 1000 mg via ORAL

## 2018-05-18 MED ORDER — ONDANSETRON HCL 4 MG/2ML IJ SOLN
INTRAMUSCULAR | Status: DC | PRN
  Administered 2018-05-18: 4 mg via INTRAVENOUS

## 2018-05-18 MED ORDER — ONDANSETRON HCL 4 MG/2ML IJ SOLN: 4.0000 mg | Freq: Once | INTRAMUSCULAR | Status: DC | PRN

## 2018-05-18 MED ORDER — OXYCODONE HCL 5 MG PO TABS: 5.0000 mg | ORAL_TABLET | ORAL | Status: DC | PRN

## 2018-05-18 MED ORDER — ACETAMINOPHEN 500 MG PO TABS
1000.0000 mg | ORAL_TABLET | Freq: Four times a day (QID) | ORAL | Status: DC
  Administered 2018-05-18 – 2018-05-21 (×10): 1000 mg via ORAL
  Filled 2018-05-18 (×11): qty 2

## 2018-05-18 MED ORDER — ONDANSETRON HCL 4 MG/2ML IJ SOLN
4.0000 mg | Freq: Four times a day (QID) | INTRAMUSCULAR | Status: DC | PRN
  Administered 2018-05-18 – 2018-05-21 (×3): 4 mg via INTRAVENOUS
  Filled 2018-05-18 (×3): qty 2

## 2018-05-18 MED ORDER — SODIUM CHLORIDE 0.9 % IV SOLN
INTRAVENOUS | Status: DC
  Administered 2018-05-18 – 2018-05-19 (×3): via INTRAVENOUS

## 2018-05-18 MED ORDER — SODIUM CHLORIDE 0.9 % IV SOLN
INTRAVENOUS | Status: DC
  Administered 2018-05-18 (×2): via INTRAVENOUS

## 2018-05-18 MED ORDER — FENTANYL CITRATE (PF) 250 MCG/5ML IJ SOLN
INTRAMUSCULAR | Status: AC
Start: 2018-05-18 — End: ?
  Filled 2018-05-18: qty 5

## 2018-05-18 MED ORDER — PROPOFOL 10 MG/ML IV BOLUS
INTRAVENOUS | Status: AC
  Filled 2018-05-18: qty 20

## 2018-05-18 SURGICAL SUPPLY — 30 items
CANISTER SUCT 1200ML W/VALVE (MISCELLANEOUS) ×2 IMPLANT
DRAIN PENROSE 1/4X12 LTX (DRAIN) ×2 IMPLANT
DRAPE LAPAROTOMY 100X77 ABD (DRAPES) ×2 IMPLANT
ELECT BLADE 6.5 EXT (BLADE) ×2 IMPLANT
ELECT CAUTERY BLADE 6.4 (BLADE) ×2 IMPLANT
ELECT REM PT RETURN 9FT ADLT (ELECTROSURGICAL) ×2
ELECTRODE REM PT RTRN 9FT ADLT (ELECTROSURGICAL) ×1 IMPLANT
GLOVE PROTEXIS LATEX SZ 7.5 (GLOVE) ×6 IMPLANT
GLOVE SURG SYN 7.0 (GLOVE) ×6 IMPLANT
GOWN STRL REUS W/ TWL LRG LVL3 (GOWN DISPOSABLE) ×2 IMPLANT
GOWN STRL REUS W/ TWL XL LVL3 (GOWN DISPOSABLE) ×2 IMPLANT
GOWN STRL REUS W/TWL LRG LVL3 (GOWN DISPOSABLE) ×2
GOWN STRL REUS W/TWL XL LVL3 (GOWN DISPOSABLE) ×2
HOLDER FOLEY CATH W/STRAP (MISCELLANEOUS) ×2 IMPLANT
LABEL OR SOLS (LABEL) ×2 IMPLANT
NEEDLE HYPO 22GX1.5 SAFETY (NEEDLE) ×2 IMPLANT
PACK BASIN MAJOR ARMC (MISCELLANEOUS) ×2 IMPLANT
RELOAD PROXIMATE 75MM BLUE (ENDOMECHANICALS) ×4 IMPLANT
SPONGE LAP 18X18 RF (DISPOSABLE) ×2 IMPLANT
STAPLER PROXIMATE 75MM BLUE (STAPLE) ×2 IMPLANT
STAPLER SKIN PROX 35W (STAPLE) ×2 IMPLANT
SUT PDS AB 1 TP1 54 (SUTURE) ×4 IMPLANT
SUT SILK 2 0 (SUTURE) ×1
SUT SILK 2 0 SH CR/8 (SUTURE) IMPLANT
SUT SILK 2 0SH CR/8 30 (SUTURE) ×2 IMPLANT
SUT SILK 2-0 18XBRD TIE 12 (SUTURE) ×1 IMPLANT
SUT SILK 3-0 (SUTURE) ×6 IMPLANT
SYR 20CC LL (SYRINGE) ×2 IMPLANT
SYR BULB IRRIG 60ML STRL (SYRINGE) ×2 IMPLANT
TRAY FOLEY MTR SLVR 16FR STAT (SET/KITS/TRAYS/PACK) ×2 IMPLANT

## 2018-05-18 NOTE — Transfer of Care (Signed)
Immediate Anesthesia Transfer of Care Note  Patient: Carly Khan  Procedure(s) Performed: ILEOSTOMY TAKEDOWN (Right ) HERNIA REPAIR PARASTOMAL (Right )  Patient Location: PACU  Anesthesia Type:General  Level of Consciousness: awake, alert , oriented and patient cooperative  Airway & Oxygen Therapy: Patient connected to nasal cannula oxygen  Post-op Assessment: Report given to RN and Post -op Vital signs reviewed and stable  Post vital signs: Reviewed and stable  Last Vitals:  Vitals Value Taken Time  BP    Temp    Pulse 75 05/18/2018  1:33 PM  Resp    SpO2 100 % 05/18/2018  1:33 PM  Vitals shown include unvalidated device data.  Last Pain:  Vitals:   05/18/18 0946  TempSrc: Oral         Complications: No apparent anesthesia complications

## 2018-05-18 NOTE — Anesthesia Preprocedure Evaluation (Signed)
Anesthesia Evaluation  Patient identified by MRN, date of birth, ID band Patient awake    Reviewed: Allergy & Precautions, H&P , NPO status , Patient's Chart, lab work & pertinent test results, reviewed documented beta blocker date and time   Airway Mallampati: II  TM Distance: >3 FB Neck ROM: full    Dental  (+) Poor Dentition, Chipped, Missing, Loose   Pulmonary neg pulmonary ROS, shortness of breath and with exertion, COPD,  COPD inhaler, Current Smoker,    Pulmonary exam normal        Cardiovascular Exercise Tolerance: Poor hypertension, On Medications + CAD and + CABG  negative cardio ROS Normal cardiovascular exam Rhythm:regular Rate:Normal     Neuro/Psych CVA negative neurological ROS  negative psych ROS   GI/Hepatic negative GI ROS, Neg liver ROS, PUD,   Endo/Other  negative endocrine ROS  Renal/GU CRFRenal diseasenegative Renal ROS  negative genitourinary   Musculoskeletal   Abdominal   Peds  Hematology negative hematology ROS (+)   Anesthesia Other Findings Past Medical History: 1991: CAD in native artery     Comment:  a. s/p CABG in 1991 in [place]; b. 06/2017 MV: EF 72%, no               ischemia/infarct-->Low risk. 2016: Cancer of left lung ([place])     Comment:  a. 12/2015 LUL lung resection. No date: Chronic Dyspnea on exertion 04/09/2017: CKD (chronic kidney disease) stage 3, GFR 30-59 ml/min  ([facility])     Comment:  a. 04/2017 ARF & Hyperkalemia req HD x 1. 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 Incidentally noted on CT 04/2017;  b. 04/2017 Echo: EF               65-70%, Gr1 DD, no LA  filling defect seen; c. 05/2017 TEE:              EF 60-65%, no RA/LA thrombus; d. Later found to have PAF               on Event monitor 06/2017-->initially placed on eliquis;               changed to coumadin 2/2 $. No date: PAF (paroxysmal atrial fibrillation) ([place])     Comment:  a. Discovered 06/2017 on Event monitor as part of w/u for              LAA thrombus. CHA2DS2VASc = 4-->Eliquis. No date: Stroke [facility])     Comment:  a. Pt reports prior h/o CVA; b. 07/2017 Head CT: Chronic               atrophy and small vessel ischemic changes. No acute               intracranial abnormalities. Past Surgical History: No date: ABDOMINAL HYSTERECTOMY 1990: cardiac bypass No date: carpel tunnel No date: CHOLECYSTECTOMY No date: COLON SURGERY No date: COLONOSCOPY 03/03/2018: COLONOSCOPY WITH PROPOFOL; N/A     Comment:  Procedure: COLONOSCOPY WITH PROPOFOL  ileostomy               takedown;  Surgeon: Lin Landsman, MD;  Location:  [facility];  Service: Gastroenterology;  Laterality:               N/A; 1990's: CORONARY ARTERY BYPASS GRAFT     Comment:  x 4 No date: CT guided needle placement 03/03/2018: ESOPHAGOGASTRODUODENOSCOPY (EGD) WITH PROPOFOL     Comment:  Procedure: ESOPHAGOGASTRODUODENOSCOPY (EGD) WITH               PROPOFOL;  Surgeon: Lin Landsman, MD;  Location:               [place];  Service: Gastroenterology;; No date: EXPLORATORY LAPAROTOMY No date: GALLBLADDER SURGERY No date: loop ileostomy No date: SIGMOID RESECTION / RECTOPEXY 05/04/2017: TEE WITHOUT CARDIOVERSION; N/A     Comment:  Procedure: TRANSESOPHAGEAL ECHOCARDIOGRAM (TEE);                Surgeon: Wellington Hampshire, MD;  Location: [facility] ORS;                Service: Cardiovascular;  Laterality: N/A; No date: THORACOSCOPY No date: vaginectomy BMI    Body Mass Index:  23.96 kg/m     Reproductive/Obstetrics negative OB ROS                              Anesthesia Physical Anesthesia Plan  ASA: IV  Anesthesia Plan: General ETT   Post-op Pain Management:    Induction:   PONV Risk Score and Plan:   Airway Management Planned:   Additional Equipment:   Intra-op Plan:   Post-operative Plan:   Informed Consent: I have reviewed the patients History and Physical, chart, labs and discussed the procedure including the risks, benefits and alternatives for the proposed anesthesia with the patient or authorized representative who has indicated his/her understanding and acceptance.   Dental Advisory Given  Plan Discussed with: CRNA  Anesthesia Plan Comments:         Anesthesia Quick Evaluation

## 2018-05-18 NOTE — Op Note (Signed)
Procedure Date:  05/18/2018  Pre-operative Diagnosis:  Ileostomy in place, parastomal hernia  Post-operative Diagnosis: Ileostomy in place, parastomal hernia  Procedure:  Ileostomy closure, repair of parastomal hernia  Surgeon:  Melvyn Neth, MD  Assistant:  Nestor Lewandowsky, MD.  His assistance was essential due to the complexity of the case and the possibility of converting to a formal laparotomy.  His help was critical in lysis of adhesions, creation of a new anastomosis, and fascial closure  Anesthesia:  General endotracheal  Estimated Blood Loss:  50 ml  Specimens:  Loop ileostomy, hernia sac  Complications:  None  Indications for Procedure:  This is a 67 y.o. female who presents with a history of loop ileostomy from a sigmoidectomy with colovaginal fistula takedown a few years ago. She was seen preoperatively by GI, Cardiology, Oncology, and ENT, and she was cleared for ostomy reversal.  The risks of bleeding, abscess or infection, injury to surrounding structures, and need for further procedures were all discussed with the patient and was willing to proceed.  Description of Procedure: The patient was correctly identified in the preoperative area and brought into the operating room.  The patient was placed supine with VTE prophylaxis in place.  Appropriate time-outs were performed.  Anesthesia was induced and the patient was intubated.  Foley catheter was placed.  Appropriate antibiotics were infused.  The ostomy appliance was removed revealing a patent and healthy loop ileostomy.  The abdomen was prepped and draped in a sterile fashion.  Cautery was used to cut at the mucocutaneous junction, dissecting the loop ileostomy free of adhesions and surrounding subcutaneous tissue.  While doing this, the parastomal hernia was identified and the hernia sac was entered.  Then we proceeded with lysis of adhesions both bluntly and with cautery to free up the rest of the small bowel at the  ostomy and hernia site.  We were able to free up enough length of bowel to get the scarring that was causing her prior episodes of small bowel obstruction. The bowel was freed from the fascial edges and these were cleaned up with cautery as well.  We then identified healthy small bowel segments for our new anastomosis.  Windows in the mesentery of the loop ileostomy were created and the ostomy itself was resected using a linear 75 mm blue load stapler.  Enterotomies were created at the blind ends and another 75 mm blue load was fired along the antimesenteric border of the bowel, creating our common channel.  The open end was then closed using another 75 mm blue load.  3-0 Silk sutures were used to imbricate the staple line.  The bowel appeared healthy and the anastomosis was patent.  This was placed back into the abdomen.  The abdominal cavity was the irrigated with a liter of warm normal saline.  The hernia sac that was extending to the lateral aspect of the ostomy site was resected using cautery.  60 ml of Exparel solution was infiltrated along the peritoneum, fascia, and subcutaneous tissue.  The fascia was then closed using two #1 PDS sutures.  The wound was irrigated again, and hemostasis was assured with cautery.  The wound was then closed using staples, keeping a 1/4 inch penrose drain in to allow for drainage.  The skin was then cleaned and the wound was dressed with ABD pad and tape.  The patient was emerged from anesthesia and extubated and brought to the recovery room for further management.  The patient tolerated the procedure  well and all counts were correct at the end of the case.   Melvyn Neth, MD

## 2018-05-18 NOTE — Anesthesia Procedure Notes (Signed)
Procedure Name: Intubation Date/Time: 05/18/2018 10:48 AM Performed by: Bernardo Heater, CRNA Pre-anesthesia Checklist: Patient identified, Emergency Drugs available, Suction available and Patient being monitored Patient Re-evaluated:Patient Re-evaluated prior to induction Oxygen Delivery Method: Circle system utilized Preoxygenation: Pre-oxygenation with 100% oxygen Induction Type: IV induction Laryngoscope Size: Mac and 3 Grade View: Grade I Tube size: 7.0 mm Number of attempts: 1 Placement Confirmation: ETT inserted through vocal cords under direct vision,  positive ETCO2 and breath sounds checked- equal and bilateral Secured at: 21 cm Tube secured with: Tape Dental Injury: Teeth and Oropharynx as per pre-operative assessment

## 2018-05-18 NOTE — Brief Op Note (Signed)
05/18/2018  1:38 PM  PATIENT:  Carly Khan  67 y.o. female  PRE-OPERATIVE DIAGNOSIS:  ileostomy in place, parastomal hernia  POST-OPERATIVE DIAGNOSIS:  ileostomy in place, parastomal hernia  PROCEDURE:  Procedure(s): ILEOSTOMY TAKEDOWN (Right) HERNIA REPAIR PARASTOMAL (Right)  SURGEON:  Surgeon(s) and Role:    * Ipek Westra, MD - Primary    * Nestor Lewandowsky, MD - Assisting  ANESTHESIA:   general  EBL:  50 mL   BLOOD ADMINISTERED:none  DRAINS: Penrose drain in the ostomy site   LOCAL MEDICATIONS USED:  BUPIVICAINE   SPECIMEN:  Source of Specimen:  loop ileostomy  DISPOSITION OF SPECIMEN:  PATHOLOGY  COUNTS:  YES  TOURNIQUET:  * No tourniquets in log *  DICTATION: .Dragon Dictation  PLAN OF CARE: Admit to inpatient   PATIENT DISPOSITION:  PACU - hemodynamically stable.   Delay start of Pharmacological VTE agent (>24hrs) due to surgical blood loss or risk of bleeding: yes

## 2018-05-18 NOTE — Interval H&P Note (Signed)
History and Physical Interval Note:  05/18/2018 10:18 AM  Carly Khan  has presented today for surgery, with the diagnosis of ileostomy in place, parastomal hernia  The various methods of treatment have been discussed with the patient and family. After consideration of risks, benefits and other options for treatment, the patient has consented to  Procedure(s): ILEOSTOMY TAKEDOWN (Right) HERNIA REPAIR PARASTOMAL (Right) as a surgical intervention .  The patient's history has been reviewed, patient examined, no change in status, stable for surgery.  I have reviewed the patient's chart and labs.  Questions were answered to the patient's satisfaction.     Rowena Moilanen

## 2018-05-18 NOTE — Anesthesia Post-op Follow-up Note (Signed)
Anesthesia QCDR form completed.        

## 2018-05-19 ENCOUNTER — Encounter: Payer: Self-pay | Admitting: Surgery

## 2018-05-19 LAB — PROTIME-INR
INR: 1.14
Prothrombin Time: 14.5 s (ref 11.4–15.2)

## 2018-05-19 LAB — CBC WITH DIFFERENTIAL/PLATELET
Basophils Absolute: 0 K/uL (ref 0–0.1)
Basophils Relative: 0 %
Eosinophils Absolute: 0 K/uL (ref 0–0.7)
Eosinophils Relative: 0 %
HCT: 36.5 % (ref 35.0–47.0)
Hemoglobin: 12.8 g/dL (ref 12.0–16.0)
Lymphocytes Relative: 4 %
Lymphs Abs: 0.4 K/uL — ABNORMAL LOW (ref 1.0–3.6)
MCH: 31.7 pg (ref 26.0–34.0)
MCHC: 34.9 g/dL (ref 32.0–36.0)
MCV: 90.8 fL (ref 80.0–100.0)
Monocytes Absolute: 0.5 K/uL (ref 0.2–0.9)
Monocytes Relative: 6 %
Neutro Abs: 8.4 K/uL — ABNORMAL HIGH (ref 1.4–6.5)
Neutrophils Relative %: 90 %
Platelets: 203 K/uL (ref 150–440)
RBC: 4.03 MIL/uL (ref 3.80–5.20)
RDW: 13.2 % (ref 11.5–14.5)
WBC: 9.3 K/uL (ref 3.6–11.0)

## 2018-05-19 LAB — BASIC METABOLIC PANEL WITH GFR
Anion gap: 6 (ref 5–15)
BUN: 9 mg/dL (ref 8–23)
CO2: 23 mmol/L (ref 22–32)
Calcium: 8.2 mg/dL — ABNORMAL LOW (ref 8.9–10.3)
Chloride: 107 mmol/L (ref 98–111)
Creatinine, Ser: 1.1 mg/dL — ABNORMAL HIGH (ref 0.44–1.00)
GFR calc Af Amer: 58 mL/min — ABNORMAL LOW
GFR calc non Af Amer: 50 mL/min — ABNORMAL LOW
Glucose, Bld: 132 mg/dL — ABNORMAL HIGH (ref 70–99)
Potassium: 4.1 mmol/L (ref 3.5–5.1)
Sodium: 136 mmol/L (ref 135–145)

## 2018-05-19 LAB — MAGNESIUM: MAGNESIUM: 2.1 mg/dL (ref 1.7–2.4)

## 2018-05-19 MED ORDER — WARFARIN - PHYSICIAN DOSING INPATIENT
Freq: Every day | Status: DC
  Administered 2018-05-19: 18:00:00

## 2018-05-19 MED ORDER — WARFARIN SODIUM 5 MG PO TABS
5.0000 mg | ORAL_TABLET | Freq: Once | ORAL | Status: AC
  Administered 2018-05-19 – 2018-05-20 (×2): 5 mg via ORAL
  Filled 2018-05-19 (×2): qty 1

## 2018-05-19 MED ORDER — ENOXAPARIN SODIUM 60 MG/0.6ML ~~LOC~~ SOLN
60.0000 mg | Freq: Two times a day (BID) | SUBCUTANEOUS | Status: DC
  Administered 2018-05-19 – 2018-05-21 (×5): 60 mg via SUBCUTANEOUS
  Filled 2018-05-19 (×6): qty 0.6

## 2018-05-19 NOTE — Progress Notes (Signed)
05/19/2018  Subjective: Patient is 1 Day Post-Op s/p ileostomy takedown with repair of parastomal hernia.  No acute events overnight.  Patient tolerating clear liquids and feels that she wants to have a bowel movement.  No significant pain at the ostomy site, and the small pain she has is controlled with medications.  Vital signs: Temp:  [96.6 F (35.9 C)-97.7 F (36.5 C)] 97.7 F (36.5 C) (07/17 0502) Pulse Rate:  [52-75] 55 (07/17 0502) Resp:  [14-18] 18 (07/17 0502) BP: (128-155)/(67-85) 148/73 (07/17 0502) SpO2:  [92 %-100 %] 98 % (07/17 0502)   Intake/Output: 07/16 0701 - 07/17 0700 In: 1818.8 [I.V.:1818.8] Out: 500 [Urine:450; Blood:50] Last BM Date: 05/17/18  Physical Exam: Constitutional: No acute distress Abdomen:  Soft, non-distended, appropriately tender to palpation.  Incision clean, dry, intact with staples and penrose drain in place.  New dressing placed.  Patient has a known midline ventral hernia as well, which is reducible.  Labs:  Recent Labs    05/19/18 0326  WBC 9.3  HGB 12.8  HCT 36.5  PLT 203   Recent Labs    05/19/18 0326  NA 136  K 4.1  CL 107  CO2 23  GLUCOSE 132*  BUN 9  CREATININE 1.10*  CALCIUM 8.2*   Recent Labs    05/18/18 0948 05/19/18 0326  LABPROT 13.8 14.5  INR 1.07 1.14    Imaging: No results found.  Assessment/Plan: 67 yo female s/p ileostomy takedown and repair of parastomal hernia  --will advance to full liquid diet today --resume therapeutic Lovenox and Coumadin per Dr. Darnelle Bos guidelines (telephone encounter 7/10) --d/c foley catheter --decrease IV fluids --OOB and ambulate   Melvyn Neth, [place] Pager: [phone]

## 2018-05-19 NOTE — Progress Notes (Signed)
RN removed pt.'s foley per order pt is due to void by 1600 05/19/2018.   Carly Khan [employer]

## 2018-05-19 NOTE — Plan of Care (Signed)
  Problem: Elimination: Goal: Will not experience complications related to bowel motility Outcome: Progressing   Pt is having multiple loose stools throughout the day.  Problem: Elimination: Goal: Will not experience complications related to urinary retention Outcome: Progressing  Pt was able to pee after foley was removed.

## 2018-05-19 NOTE — Anesthesia Postprocedure Evaluation (Signed)
Anesthesia Post Note  Patient: Ailsa Mireles  Procedure(s) Performed: ILEOSTOMY TAKEDOWN (Right ) HERNIA REPAIR PARASTOMAL (Right )  Patient location during evaluation: PACU Anesthesia Type: General Level of consciousness: awake and alert Pain management: pain level controlled Vital Signs Assessment: post-procedure vital signs reviewed and stable Respiratory status: spontaneous breathing, nonlabored ventilation, respiratory function stable and patient connected to nasal cannula oxygen Cardiovascular status: blood pressure returned to baseline and stable Postop Assessment: no apparent nausea or vomiting Anesthetic complications: no     Last Vitals:  Vitals:   05/19/18 1252 05/19/18 2211  BP: (!) 152/77 132/90  Pulse: (!) 58 61  Resp: 16 20  Temp: (!) 36.4 C 36.8 C  SpO2: 100% 98%    Last Pain:  Vitals:   05/19/18 2211  TempSrc: Oral  PainSc:                  Molli Barrows

## 2018-05-20 LAB — SURGICAL PATHOLOGY

## 2018-05-20 LAB — PROTIME-INR
INR: 1.34
Prothrombin Time: 16.5 seconds — ABNORMAL HIGH (ref 11.4–15.2)

## 2018-05-20 MED ORDER — PSYLLIUM 95 % PO PACK
1.0000 | PACK | Freq: Two times a day (BID) | ORAL | Status: DC
  Administered 2018-05-20 – 2018-05-21 (×3): 1 via ORAL
  Filled 2018-05-20 (×4): qty 1

## 2018-05-20 MED ORDER — PRAVASTATIN SODIUM 10 MG PO TABS
10.0000 mg | ORAL_TABLET | Freq: Every day | ORAL | Status: DC
  Administered 2018-05-20 – 2018-05-21 (×2): 10 mg via ORAL
  Filled 2018-05-20 (×2): qty 1

## 2018-05-20 MED ORDER — HYDRALAZINE HCL 20 MG/ML IJ SOLN
10.0000 mg | Freq: Four times a day (QID) | INTRAMUSCULAR | Status: DC | PRN
  Administered 2018-05-20: 10 mg via INTRAVENOUS
  Filled 2018-05-20: qty 1

## 2018-05-20 MED ORDER — PANTOPRAZOLE SODIUM 40 MG PO TBEC
40.0000 mg | DELAYED_RELEASE_TABLET | Freq: Every day | ORAL | Status: DC
  Administered 2018-05-20 – 2018-05-21 (×2): 40 mg via ORAL
  Filled 2018-05-20 (×2): qty 1

## 2018-05-20 NOTE — Progress Notes (Signed)
05/20/2018  Subjective: Patient is 2 Days Post-Op s/p ileostomy takedown and parastomal hernia repair.  No acute events overnight.  She does report that she's had multiple bowel movements and that food runs through her.  Her pain is well controlled and she walked yesterday  Vital signs: Temp:  [97.5 F (36.4 C)-98.2 F (36.8 C)] 97.9 F (36.6 C) (07/18 0450) Pulse Rate:  [58-61] 59 (07/18 0450) Resp:  [16-20] 20 (07/18 0450) BP: (132-163)/(77-90) 163/90 (07/18 0450) SpO2:  [98 %-100 %] 100 % (07/18 0450)   Intake/Output: 07/17 0701 - 07/18 0700 In: 1542.8 [I.V.:1492.8; IV Piggyback:50] Out: 1500 [Urine:1500] Last BM Date: 05/19/18  Physical Exam: Constitutional: No acute distress Abdomen:  Soft, nondistended, appropriately tender to palpation.  Incision is clean, dry, intact with staples and penrose drain in place.  Patient has a known midline ventral hernia which is reducible.  Labs:  Recent Labs    05/19/18 0326  WBC 9.3  HGB 12.8  HCT 36.5  PLT 203   Recent Labs    05/19/18 0326  NA 136  K 4.1  CL 107  CO2 23  GLUCOSE 132*  BUN 9  CREATININE 1.10*  CALCIUM 8.2*   Recent Labs    05/19/18 0326 05/20/18 0409  LABPROT 14.5 16.5*  INR 1.14 1.34    Imaging: No results found.  Assessment/Plan: 67 yo female s/p ileostomy takedown and parastomal hernia repair.  --Advance to soft diet, and add Metamucil to help bulk her stool.  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 --D/C IV fluids --Continue Coumadin 5 mg dose today and bridging with therapeutic Lovenox 60 mg BID per Dr. Darnelle Bos guidelines. --Continue ambulation. --Likely d/c to home tomorrow.   Melvyn Neth, MD [facility] Pager: [phone]

## 2018-05-21 LAB — PROTIME-INR
INR: 2.16
Prothrombin Time: 23.9 seconds — ABNORMAL HIGH (ref 11.4–15.2)

## 2018-05-21 MED ORDER — OXYCODONE HCL 5 MG PO TABS: 5.0000 mg | ORAL_TABLET | Freq: Four times a day (QID) | ORAL | 0 refills | Status: DC | PRN

## 2018-05-21 MED ORDER — PSYLLIUM 95 % PO PACK: 1.0000 | PACK | Freq: Every day | ORAL | Status: AC

## 2018-05-21 MED ORDER — WARFARIN SODIUM 5 MG PO TABS: 2.5000 mg | ORAL_TABLET | Freq: Every day | ORAL | 0 refills | Status: DC

## 2018-05-21 MED ORDER — ACETAMINOPHEN 500 MG PO TABS: 1000.0000 mg | ORAL_TABLET | Freq: Four times a day (QID) | ORAL | 0 refills | Status: AC | PRN

## 2018-05-21 MED ORDER — IBUPROFEN 600 MG PO TABS: 600.0000 mg | ORAL_TABLET | Freq: Three times a day (TID) | ORAL | 0 refills | Status: AC | PRN

## 2018-05-21 MED ORDER — POLYETHYLENE GLYCOL 3350 17 G PO PACK: 17.0000 g | PACK | Freq: Every day | ORAL | 0 refills | Status: AC | PRN

## 2018-05-21 NOTE — Care Management Important Message (Signed)
Copy of signed IM left with patient in room.  

## 2018-05-21 NOTE — Discharge Instructions (Signed)
End Ileostomy Reversal, Care After Refer to this sheet in the next few weeks. These instructions provide you with information about caring for yourself after your procedure. Your health care provider may also give you more specific instructions. Your treatment has been planned according to current medical practices, but problems sometimes occur. Call your health care provider if you have any problems or questions after your procedure. What can I expect after the procedure? After the procedure, it is common to have:  A small amount of blood or clear fluid coming from your incision.  Pain and discomfort in your abdomen, especially near your incision.  Irregular bowel movements for several days.  Follow these instructions at home: Medicines  Take over-the-counter and prescription medicines only as told by your health care provider.  If you were prescribed an antibiotic medicine, take it as told by your health care provider. Do not stop taking the antibiotic even if you start to feel better. Incision care   Keep your incision area clean and dry.  Follow instructions from your health care provider about how to take care of your incision. Make sure you: ? Wash your hands with soap and water before you change your bandage (dressing). If soap and water are not available, use hand sanitizer. ? Change your dressing as told by your health care provider. ? Leave stitches (sutures), skin glue, or adhesive strips in place. These skin closures may need to stay in place for 2 weeks or longer. If adhesive strip edges start to loosen and curl up, you may trim the loose edges. Do not remove adhesive strips completely unless your health care provider tells you to do that.  Check your incision area every day for signs of infection. Check for: ? More redness, swelling, or pain. ? More fluid or blood. ? Warmth. ? Pus or a bad smell. Eating and drinking  Follow instructions from your health care provider  about eating or drinking restrictions.  If you have pain or discomfort after you eat, try eating soft foods until you do not experience these symptoms any more.  Eat meals and snacks at regular intervals.  Drink enough fluid to keep your urine clear or pale yellow. Activity  Rest as much as possible while your small intestine heals.  Return to your normal activities as told by your health care provider. Ask your health care provider what activities are safe for you.  Avoid intense physical activity for as long as you are told by your health care provider.  Do not lift anything that is heavier than 10 lb (4.5 kg) for 6 weeks or as long as told by your health care provider. Driving  Do not drive for 24 hours if you received a sedative.  Do not drive or operate heavy machinery while taking prescription pain medicine. General instructions  Wear compression stockings as told by your health care provider. These stockings help to prevent blood clots and reduce swelling in your legs.  Do not take baths, swim, or use a hot tub until your health care provider approves.  Do not use tobacco products, including cigarettes, chewing tobacco, or e-cigarettes. If you need help quitting, ask your health care provider.  Keep track of your bowel movements.  Keep all follow-up visits as told by your health care provider. This is important. Contact a health care provider if:  You have more redness, swelling, or pain around your incision.  You have more fluid or blood coming from your incision.  Your  incision feels warm to the touch.  You have pus or a bad smell coming from your incision.  You have a fever.  You have abdominal pain, bloating, pressure, or cramping.  You are having difficulty with bowel movements, such as: ? Diarrhea. ? Constipation. ? Pain during bowel movements.  You feel nauseous.  You vomit.  You feel dizzy or lightheaded.  You have shortness of breath.  You  have an unusual lack of energy (fatigue). Get help right away if:  You have abdominal pain or cramps that do not go away with medicine or get worse.  You vomit more than once.  You have an irregular heartbeat.  You have chest pain. This information is not intended to replace advice given to you by your health care provider. Make sure you discuss any questions you have with your health care provider. Document Released: 10/09/2011 Document Revised: 03/27/2016 Document Reviewed: 07/03/2015 Elsevier Interactive Patient Education  2018 [employer].

## 2018-05-21 NOTE — Discharge Summary (Signed)
Patient ID: Carly Khan MRN: [id number] DOB/AGE: August 21, 1951 67 y.o.  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  She tolerated the surgery well and has done well post-operatively.  Her diet was slowly advanced and she's tolerating well.  Her pain is controlled with oral medication.  She's ambulating and voiding and having bowel function.  She was deemed ready for discharge home.  On exam, she was in no acute distress, with stable vital signs.  Her abdomen was soft, non-distended, and appropriately tender to palpation.  Her incision was clean, dry, intact with staples and penrose drain in place.  She has a known midline ventral hernia which has some discomfort on palpation.  This will be repaired at a later time.  She has continued on Lovenox and Coumadin per Dr. Darnelle Bos guidelines and her INR today is 2.16.  Consults: None  Disposition: Discharge disposition: 01-Home or Self Care       Discharge Instructions    Call MD for:  difficulty breathing, headache or visual disturbances   Complete by:  As directed    Call MD for:  persistant nausea and vomiting   Complete by:  As directed    Call MD for:  redness, tenderness, or signs of infection (pain, swelling, redness, odor or green/yellow discharge around incision site)   Complete by:  As directed    Call MD for:  severe uncontrolled pain   Complete by:  As directed    Call MD for:  temperature >100.4   Complete by:  As directed    Change dressing (specify)   Complete by:  As directed    Dry gauze dressing change once daily.  Secure with tape.   Diet - low sodium heart healthy   Complete by:   As directed    Discharge instructions   Complete by:  As directed    1.  Patient may shower, but do not scrub wound heavily and dab dry only. 2.  Do not submerge wounds in pool/tub 3.  Do not remove staples or drain from incision. 4.  Continue Coumadin and Lovenox as instructed by Dr. Saunders Revel:  05/20/18: Inject Lovenox in the fatty tissue every 12 hours and take Coumadin 2.5 mg (half tablet)  05/21/18: Inject Lovenox in the fatty tissue every 12 hours and take Coumadin 2.5 mg  05/22/18: Inject Lovenox in the fatty tissue every 12 hours and take Coumadin 2.5 mg  05/23/18: Inject Lovenox in the fatty tissue every 12 hours and take Coumadin 2.5 mg  05/24/18: Coumadin appt to check INR.   Driving Restrictions   Complete by:  As directed    Do not drive while taking narcotics for pain control.   Increase activity slowly   Complete by:  As directed    Lifting restrictions   Complete by:  As directed    No heavy lifting or pushing of more than 10-15 lbs for 4 weeks.     Allergies as of 05/21/2018      Reactions   Penicillin G Hives, Itching   Has patient had a PCN reaction causing immediate rash, facial/tongue/throat swelling, SOB or lightheadedness with hypotension: Yes Has patient had a PCN reaction  causing severe rash involving mucus membranes or skin necrosis: No Has patient had a PCN reaction that required hospitalization: No Has patient had a PCN reaction occurring within the last 10 years: Yes If all of the above answers are "NO", then may proceed with Cephalosporin use.   Succinylcholine    Patient's daughter had emergency surgery and was awake for the entire procedure, was told she should not have succ's and neither should her mom      Medication List    TAKE these medications   acetaminophen 500 MG tablet Commonly known as:  TYLENOL Take 2 tablets (1,000 mg total) by mouth every 6 (six) hours as needed for mild pain, fever or headache.   BREO ELLIPTA 100-25 MCG/INH  Aepb Generic drug:  fluticasone furoate-vilanterol Inhale 1 puff into the lungs 2 (two) times daily.   enoxaparin 60 MG/0.6ML injection Commonly known as:  LOVENOX Inject 0.6 mLs (60 mg total) into the skin every 12 (twelve) hours.   ibuprofen 600 MG tablet Commonly known as:  ADVIL,MOTRIN Take 1 tablet (600 mg total) by mouth every 8 (eight) hours as needed for moderate pain. What changed:    medication strength  how much to take  when to take this   metoprolol succinate 25 MG 24 hr tablet Commonly known as:  TOPROL-XL Take 1 tablet (25 mg total) by mouth daily.   omeprazole 20 MG capsule Commonly known as:  PRILOSEC Take 1 capsule (20 mg total) by mouth 2 (two) times daily before a meal.   oxyCODONE 5 MG immediate release tablet Commonly known as:  Oxy IR/ROXICODONE Take 1 tablet (5 mg total) by mouth every 6 (six) hours as needed for severe pain.   polyethylene glycol packet Commonly known as:  MIRALAX / GLYCOLAX Take 17 g by mouth daily as needed for mild constipation.   pravastatin 10 MG tablet Commonly known as:  PRAVACHOL Take 10 mg by mouth daily.   psyllium 95 % Pack Commonly known as:  HYDROCIL/METAMUCIL Take 1 packet by mouth daily.   warfarin 5 MG tablet Commonly known as:  COUMADIN Take as directed. If you are unsure how to take this medication, talk to your nurse or doctor. Original instructions:  Take 0.5 tablets (2.5 mg total) by mouth daily at 6 PM. Take 1 tablet daily What changed:  how much to take            Discharge Care Instructions  (From admission, onward)        Start     Ordered   05/21/18 0000  Change dressing (specify)    Comments:  Dry gauze dressing change once daily.  Secure with tape.   05/21/18 1027     Follow-up Information    Olean Ree, MD Follow up on 05/26/2018.   Specialty:  General Surgery Why:  Follow up with Dr. Dahlia Byes on 7/24 for staple and drain removal. Contact information: [place]  [place] [address] [phone]        [name], MD Follow up on 05/24/2018.   Specialty:  Cardiology Why:  Follow up for INR check Contact information: [address] [place] [address] [phone]

## 2018-05-21 NOTE — Care Management (Signed)
Patient was admitted post ileostomy take down.  Patient discharged home today.  Per Joelene Millin with [facility] they received a referral outpatient for home health.  Per MD there were not home health indications at discharge.  [facility] notified.

## 2018-05-21 NOTE — Progress Notes (Signed)
Discharge teaching given to patient, patient verbalized understanding and had no questions. Patient IV removed. Patient will be transported home by family. All patient belongings gathered prior to leaving.  

## 2018-05-24 ENCOUNTER — Ambulatory Visit: Admission: RE | Admit: 2018-05-24 | Payer: Medicare Other | Source: Ambulatory Visit

## 2018-05-25 ENCOUNTER — Other Ambulatory Visit: Payer: Self-pay

## 2018-05-25 ENCOUNTER — Encounter: Payer: Self-pay | Admitting: Intensive Care

## 2018-05-25 ENCOUNTER — Emergency Department: Payer: Medicare Other

## 2018-05-25 ENCOUNTER — Telehealth: Payer: Self-pay | Admitting: General Practice

## 2018-05-25 ENCOUNTER — Inpatient Hospital Stay
Admission: EM | Admit: 2018-05-25 | Discharge: 2018-05-29 | DRG: 392 | Disposition: A | Discharge status: Home Health | Payer: Medicare Other | Attending: Surgery | Admitting: Surgery

## 2018-05-25 DIAGNOSIS — K439 Ventral hernia without obstruction or gangrene: Secondary | ICD-10-CM | POA: Diagnosis present

## 2018-05-25 DIAGNOSIS — N281 Cyst of kidney, acquired: Secondary | ICD-10-CM | POA: Diagnosis present

## 2018-05-25 DIAGNOSIS — N183 Chronic kidney disease, stage 3 (moderate): Secondary | ICD-10-CM | POA: Diagnosis present

## 2018-05-25 DIAGNOSIS — I700 Atherosclerosis of aorta: Secondary | ICD-10-CM | POA: Diagnosis present

## 2018-05-25 DIAGNOSIS — R791 Abnormal coagulation profile: Secondary | ICD-10-CM

## 2018-05-25 DIAGNOSIS — Z888 Allergy status to other drugs, medicaments and biological substances status: Secondary | ICD-10-CM | POA: Diagnosis not present

## 2018-05-25 DIAGNOSIS — K625 Hemorrhage of anus and rectum: Secondary | ICD-10-CM | POA: Diagnosis present

## 2018-05-25 DIAGNOSIS — N200 Calculus of kidney: Secondary | ICD-10-CM | POA: Diagnosis present

## 2018-05-25 DIAGNOSIS — Z7901 Long term (current) use of anticoagulants: Secondary | ICD-10-CM

## 2018-05-25 DIAGNOSIS — K529 Noninfective gastroenteritis and colitis, unspecified: Principal | ICD-10-CM | POA: Diagnosis present

## 2018-05-25 DIAGNOSIS — I4891 Unspecified atrial fibrillation: Secondary | ICD-10-CM | POA: Diagnosis present

## 2018-05-25 DIAGNOSIS — I2510 Atherosclerotic heart disease of native coronary artery without angina pectoris: Secondary | ICD-10-CM | POA: Diagnosis present

## 2018-05-25 DIAGNOSIS — Z8249 Family history of ischemic heart disease and other diseases of the circulatory system: Secondary | ICD-10-CM | POA: Diagnosis not present

## 2018-05-25 DIAGNOSIS — Z8673 Personal history of transient ischemic attack (TIA), and cerebral infarction without residual deficits: Secondary | ICD-10-CM | POA: Diagnosis not present

## 2018-05-25 DIAGNOSIS — Z880 Allergy status to penicillin: Secondary | ICD-10-CM | POA: Diagnosis not present

## 2018-05-25 DIAGNOSIS — F17210 Nicotine dependence, cigarettes, uncomplicated: Secondary | ICD-10-CM | POA: Diagnosis present

## 2018-05-25 DIAGNOSIS — Z951 Presence of aortocoronary bypass graft: Secondary | ICD-10-CM

## 2018-05-25 DIAGNOSIS — I129 Hypertensive chronic kidney disease with stage 1 through stage 4 chronic kidney disease, or unspecified chronic kidney disease: Secondary | ICD-10-CM | POA: Diagnosis present

## 2018-05-25 DIAGNOSIS — Z79899 Other long term (current) drug therapy: Secondary | ICD-10-CM | POA: Diagnosis not present

## 2018-05-25 DIAGNOSIS — Z823 Family history of stroke: Secondary | ICD-10-CM | POA: Diagnosis not present

## 2018-05-25 DIAGNOSIS — K7460 Unspecified cirrhosis of liver: Secondary | ICD-10-CM | POA: Diagnosis present

## 2018-05-25 DIAGNOSIS — R55 Syncope and collapse: Secondary | ICD-10-CM | POA: Diagnosis present

## 2018-05-25 DIAGNOSIS — T45515A Adverse effect of anticoagulants, initial encounter: Secondary | ICD-10-CM | POA: Diagnosis present

## 2018-05-25 DIAGNOSIS — Z90710 Acquired absence of both cervix and uterus: Secondary | ICD-10-CM

## 2018-05-25 DIAGNOSIS — K922 Gastrointestinal hemorrhage, unspecified: Secondary | ICD-10-CM

## 2018-05-25 DIAGNOSIS — Z7951 Long term (current) use of inhaled steroids: Secondary | ICD-10-CM

## 2018-05-25 LAB — COMPREHENSIVE METABOLIC PANEL
ALT: 7 U/L (ref 0–44)
ANION GAP: 10 (ref 5–15)
AST: 17 U/L (ref 15–41)
Albumin: 3.1 g/dL — ABNORMAL LOW (ref 3.5–5.0)
Alkaline Phosphatase: 63 U/L (ref 38–126)
BILIRUBIN TOTAL: 1.2 mg/dL (ref 0.3–1.2)
BUN: 13 mg/dL (ref 8–23)
CO2: 25 mmol/L (ref 22–32)
Calcium: 8.1 mg/dL — ABNORMAL LOW (ref 8.9–10.3)
Chloride: 105 mmol/L (ref 98–111)
Creatinine, Ser: 1.01 mg/dL — ABNORMAL HIGH (ref 0.44–1.00)
GFR, EST NON AFRICAN AMERICAN: 56 mL/min — AB (ref >60)
Glucose, Bld: 138 mg/dL — ABNORMAL HIGH (ref 70–99)
Potassium: 3.8 mmol/L (ref 3.5–5.1)
Sodium: 140 mmol/L (ref 135–145)
TOTAL PROTEIN: 6 g/dL — AB (ref 6.5–8.1)

## 2018-05-25 LAB — CBC WITH DIFFERENTIAL/PLATELET
Basophils Absolute: 0 10*3/uL (ref 0–0.1)
Basophils Relative: 0 %
Eosinophils Absolute: 0.1 10*3/uL (ref 0–0.7)
Eosinophils Relative: 2 %
HEMATOCRIT: 28 % — AB (ref 35.0–47.0)
Hemoglobin: 9.9 g/dL — ABNORMAL LOW (ref 12.0–16.0)
LYMPHS ABS: 0.8 10*3/uL — AB (ref 1.0–3.6)
LYMPHS PCT: 11 %
MCH: 32.7 pg (ref 26.0–34.0)
MCHC: 35.4 g/dL (ref 32.0–36.0)
MCV: 92.4 fL (ref 80.0–100.0)
MONO ABS: 0.6 10*3/uL (ref 0.2–0.9)
MONOS PCT: 8 %
NEUTROS PCT: 79 %
Neutro Abs: 5.9 10*3/uL (ref 1.4–6.5)
Platelets: 308 10*3/uL (ref 150–440)
RBC: 3.03 MIL/uL — ABNORMAL LOW (ref 3.80–5.20)
RDW: 14 % (ref 11.5–14.5)
WBC: 7.4 10*3/uL (ref 3.6–11.0)

## 2018-05-25 LAB — CBC
HEMATOCRIT: 21.3 % — AB (ref 35.0–47.0)
HEMOGLOBIN: 7.4 g/dL — AB (ref 12.0–16.0)
MCH: 32 pg (ref 26.0–34.0)
MCHC: 34.9 g/dL (ref 32.0–36.0)
MCV: 91.8 fL (ref 80.0–100.0)
Platelets: 249 10*3/uL (ref 150–440)
RBC: 2.32 MIL/uL — AB (ref 3.80–5.20)
RDW: 13.9 % (ref 11.5–14.5)
WBC: 4.4 10*3/uL (ref 3.6–11.0)

## 2018-05-25 LAB — PROTIME-INR
INR: 4.27 — AB
INR: 2.51
PROTHROMBIN TIME: 40.7 s — AB (ref 11.4–15.2)
Prothrombin Time: 26.9 seconds — ABNORMAL HIGH (ref 11.4–15.2)

## 2018-05-25 LAB — APTT: APTT: 66 s — AB (ref 24–36)

## 2018-05-25 LAB — TROPONIN I

## 2018-05-25 MED ORDER — ONDANSETRON HCL 4 MG/2ML IJ SOLN: 4.0000 mg | Freq: Four times a day (QID) | INTRAMUSCULAR | Status: DC | PRN

## 2018-05-25 MED ORDER — METOPROLOL SUCCINATE ER 25 MG PO TB24
25.0000 mg | ORAL_TABLET | Freq: Every day | ORAL | Status: DC
  Administered 2018-05-27 – 2018-05-28 (×2): 25 mg via ORAL
  Filled 2018-05-25 (×3): qty 1

## 2018-05-25 MED ORDER — ONDANSETRON 4 MG PO TBDP: 4.0000 mg | ORAL_TABLET | Freq: Four times a day (QID) | ORAL | Status: DC | PRN

## 2018-05-25 MED ORDER — MORPHINE SULFATE (PF) 2 MG/ML IV SOLN: 2.0000 mg | INTRAVENOUS | Status: DC | PRN

## 2018-05-25 MED ORDER — SODIUM CHLORIDE 0.9 % IV SOLN
80.0000 mg | Freq: Once | INTRAVENOUS | Status: AC
  Administered 2018-05-25: 80 mg via INTRAVENOUS
  Filled 2018-05-25: qty 80

## 2018-05-25 MED ORDER — IOPAMIDOL (ISOVUE-300) INJECTION 61%
100.0000 mL | Freq: Once | INTRAVENOUS | Status: AC | PRN
  Administered 2018-05-25: 100 mL via INTRAVENOUS

## 2018-05-25 MED ORDER — SODIUM CHLORIDE 0.9 % IV SOLN: 10.0000 mL/h | Freq: Once | INTRAVENOUS | Status: DC

## 2018-05-25 MED ORDER — LACTATED RINGERS IV SOLN
INTRAVENOUS | Status: DC
  Administered 2018-05-25: 23:00:00 via INTRAVENOUS

## 2018-05-25 NOTE — ED Notes (Signed)
Patients 22G IV blew during transfusion of FFP. IV team contacted to start another IV due to difficult IV start. Then patient can finish FFP

## 2018-05-25 NOTE — ED Notes (Signed)
IV team present and reported pt does not need two IV's to get fresh frozen plasma and to give with 22G already obtained by this RN.  If something happens to IV to put in another order and she will come back and obtain another IV.

## 2018-05-25 NOTE — ED Notes (Signed)
Patient transported to CT 

## 2018-05-25 NOTE — ED Triage Notes (Signed)
Patient arrived by EMS from home for syncopal episode and rectal bleeding. Had a ileostomy reversal last Tuesday and currently taking heparin. PT started experiencing rectal bleeding yesterday. Patient had witnessed syncopal episode today in chair at home. Denies hitting head or falling. EMS vitals 126/82 b/p, 93HR, 100RA

## 2018-05-25 NOTE — ED Provider Notes (Signed)
[facility] Emergency Department Provider Note    First MD Initiated Contact with Patient 05/25/18 1459     (approximate)  I have reviewed the triage vital signs and the nursing notes.   HISTORY  Chief Complaint Rectal Bleeding    HPI [name] is a 67 y.o. female with extensive past medical history with recent admission to the hospital for ileostomy reversal parastomal hernia presents the ER with syncopal event that occurred earlier today.  Patient states that she has been having bright red blood per rectum.  She is on Coumadin.  Denies any chest pain but does feel very weak and lightheaded particularly when she stands.  Did not hit her head.  Syncopal event occurred while she was sitting.  Was witnessed by family where she became unresponsive for a few seconds and then was dazed shortly thereafter.  She denies any pain.    Past Medical History:  Diagnosis Date  . CAD in native artery 1991   a. s/p CABG in 1991 in [place]; b. 06/2017 MV: EF 72%, no ischemia/infarct-->Low risk.  . Cancer of left lung ([place]) 2016   a. 12/2015 LUL lung resection.  . Chronic Dyspnea on exertion   . CKD (chronic kidney disease) stage 3, GFR 30-59 ml/min ([place]) 04/09/2017   a. 04/2017 ARF & Hyperkalemia req HD x 1.  . 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 Diverticulitis   . Diverticulitis   . Dyspnea   . Essential hypertension 04/09/2017  . Family history of adverse reaction to anesthesia   . History of sebaceous cyst 02/28/2017  . History of stress test   . Intermittent vertigo 04/09/2017  . Itching 02/28/2017  . Left Atrial Appendage Thrombus    a. Incidentally noted on CT 04/2017;  b. 04/2017 Echo: EF 65-70%, Gr1 DD, no LA filling defect seen; c. 05/2017 TEE: EF 60-65%, no RA/LA thrombus; d. Later found to have PAF on Event monitor 06/2017-->initially placed on eliquis; changed to coumadin 2/2 $.  . PAF (paroxysmal atrial fibrillation) ([place])    a. Discovered 06/2017 on Event monitor as part of  w/u for LAA thrombus. CHA2DS2VASc = 4-->Eliquis.  . Stroke [facility])    a. Pt reports prior h/o CVA; b. 07/2017 Head CT: Chronic atrophy and small vessel ischemic changes. No acute intracranial abnormalities.   Family History  Problem Relation Age of Onset  . Cancer Mother   . Stomach cancer Mother   . Heart disease Father   . Hypertension Father   . Parkinson's disease Sister   . Cervical cancer Sister    Past Surgical History:  Procedure Laterality Date  . ABDOMINAL HYSTERECTOMY    . cardiac bypass  1990  . carpel tunnel    . CHOLECYSTECTOMY    . COLON SURGERY    . COLONOSCOPY    . COLONOSCOPY WITH PROPOFOL N/A 03/03/2018   Procedure: COLONOSCOPY WITH PROPOFOL  ileostomy takedown;  Surgeon: Lin Landsman, MD;  Location: [place];  Service: Gastroenterology;  Laterality: N/A;  . CORONARY ARTERY BYPASS GRAFT  1990's   x 4  . CT guided needle placement    . ESOPHAGOGASTRODUODENOSCOPY (EGD) WITH PROPOFOL  03/03/2018   Procedure: ESOPHAGOGASTRODUODENOSCOPY (EGD) WITH PROPOFOL;  Surgeon: Lin Landsman, MD;  Location: [facility] ENDOSCOPY;  Service: Gastroenterology;;  . EXPLORATORY LAPAROTOMY    . GALLBLADDER SURGERY    . ILEOSTOMY CLOSURE Right 05/18/2018   Procedure: ILEOSTOMY TAKEDOWN;  Surgeon: Olean Ree, MD;  Location: [facility] ORS;  Service: General;  Laterality: Right;  . loop ileostomy    . PARASTOMAL HERNIA REPAIR Right 05/18/2018   Procedure: HERNIA REPAIR PARASTOMAL;  Surgeon: Olean Ree, MD;  Location: [facility] ORS;  Service: General;  Laterality: Right;  . SIGMOID RESECTION / RECTOPEXY    . TEE WITHOUT CARDIOVERSION N/A 05/04/2017   Procedure: TRANSESOPHAGEAL ECHOCARDIOGRAM (TEE);  Surgeon: Wellington Hampshire, MD;  Location: [facility] ORS;  Service: Cardiovascular;  Laterality: N/A;  . THORACOSCOPY    . vaginectomy     Patient Active Problem List   Diagnosis Date Noted  . History of ileostomy 05/18/2018  . Ileostomy in place [facility])   . Encounter for therapeutic drug  monitoring 05/12/2018  . Nausea vomiting and diarrhea   . PUD (peptic ulcer disease)   . Chronic gastric erosion   . Colon cancer screening   . Bowel obstruction ([place]) 12/29/2017  . Intractable nausea and vomiting 12/28/2017  . UTI (urinary tract infection) 12/28/2017  . Chronic obstructive pulmonary disease ([place]) 11/25/2017  . Current smoker 11/25/2017  . Personal history of lung cancer 11/25/2017  . Pre-diabetes 08/30/2017  . Pure hypercholesterolemia 08/30/2017  . Hepatic cirrhosis ([place]) 07/01/2017  . Shortness of breath 06/17/2017  . Paroxysmal atrial fibrillation ([place]) 06/17/2017  . Thrombus of left atrial appendage without antecedent myocardial infarction 06/17/2017  . Incisional hernia, without obstruction or gangrene 05/27/2017  . 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 Thrombus in heart chamber 05/17/2017  . Hyperkalemia   . Acute renal failure ([place])   . CKD (chronic kidney disease) stage 3, GFR 30-59 ml/min ([facility]) 04/09/2017  . Essential hypertension 04/09/2017  . Intermittent vertigo 04/09/2017  . CAD in native artery 02/28/2017  . 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 History of sebaceous cyst 02/28/2017  . Itching 02/28/2017  . 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 Lung nodule 06/25/2016  . Adenocarcinoma of left lung ([place]) 06/19/2016  . Primary cancer of left upper lobe of lung ([place]) 06/19/2016  . AKI (acute kidney injury) ([place]) 12/29/2015  . 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 Authorizing Provider  acetaminophen (TYLENOL) 500 MG tablet Take 2 tablets (1,000 mg total) by mouth every 6 (six) hours as needed for mild pain, fever or headache. 05/21/18  Yes Piscoya, Jose, MD  BREO ELLIPTA 100-25 MCG/INH AEPB Inhale 1 puff into the lungs 2 (two) times daily.   Yes [provider]  ibuprofen (ADVIL,MOTRIN) 600 MG tablet Take 1 tablet (600 mg total) by mouth every 8 (eight) hours as needed for moderate pain. 05/21/18  Yes Piscoya, Jacqulyn Bath, MD  metoprolol succinate (TOPROL-XL) 25 MG 24 hr tablet Take 1 tablet (25 mg total) by mouth daily. 01/04/18 01/04/19 Yes Bettey Costa, MD  omeprazole (PRILOSEC) 20 MG capsule Take 1 capsule (20 mg total) by mouth 2 (two) times daily before a meal. 03/03/18 08/30/18 Yes Vanga, Tally Due, MD  oxyCODONE (OXY IR/ROXICODONE) 5 MG immediate release tablet Take 1 tablet (5 mg total) by mouth every 6 (six) hours as needed for severe pain. 05/21/18  Yes Piscoya, Jacqulyn Bath, MD  polyethylene glycol (MIRALAX / GLYCOLAX) packet Take 17 g by mouth daily as needed for mild constipation. 05/21/18  Yes Piscoya, Jacqulyn Bath, MD  pravastatin (PRAVACHOL) 10 MG tablet Take 10 mg by mouth daily.    Yes [provider]  psyllium (HYDROCIL/METAMUCIL) 95 %  PACK Take 1 packet by mouth daily. 05/21/18  Yes Piscoya, Jacqulyn Bath, MD  warfarin (COUMADIN) 5 MG tablet Take 0.5 tablets (2.5 mg total) by mouth daily at 6 PM. Take 1 tablet daily Patient taking differently: Take 2.5 mg by mouth daily at 6 PM.  05/21/18  Yes Piscoya, Jose, MD  enoxaparin (LOVENOX) 60 MG/0.6ML injection Inject 0.6 mLs (60 mg total) into the skin every 12 (twelve) hours. Patient not taking: Reported on 05/25/2018 05/12/18   End, Harrell Gave, MD    Allergies Penicillin g and Succinylcholine    Social History Social History   Tobacco Use  . Smoking status: Current Every Day Smoker    Packs/day: 0.50    Years: 40.00    Pack years: 20.00  . Smokeless tobacco: Never Used  . Tobacco comment: denied smoking info  Substance Use Topics  . Alcohol use: No  . Drug use: No    Review of Systems Patient denies headaches, rhinorrhea, blurry vision, numbness, shortness of breath, chest pain, edema, cough, abdominal pain, nausea, vomiting, diarrhea, dysuria, fevers, rashes or hallucinations unless  otherwise stated above in HPI. ____________________________________________   PHYSICAL EXAM:  VITAL SIGNS: Vitals:   05/25/18 1600 05/25/18 1712  BP: 130/87 131/79  Pulse: 100 91  Resp: 14 16  Temp:  98.1 F (36.7 C)  SpO2: 100% 100%    Constitutional: Alert and oriented.  Eyes: Conjunctivae are normal.  Head: Atraumatic. Nose: No congestion/rhinnorhea. Mouth/Throat: Mucous membranes are moist.   Neck: No stridor. Painless ROM.  Cardiovascular: Normal rate, regular rhythm. Grossly normal heart sounds.  Good peripheral circulation. Respiratory: Normal respiratory effort.  No retractions. Lungs CTAB. Gastrointestinal: Soft and appropriate tenderness given recent surgery.  Large ventral hernia soft and reducible,  Scattered ecchymosis 2/2 lovenox injections.  No distention. No abdominal bruits. No CVA tenderness.  RLQ surgical incision appears c/d/i Genitourinary: Exam does show fairly large volume of bright red blood per rectum with some clots.  Does have external hemorrhoid that does not appear to be a source of bleeding.  No rectal masses or tenderness. Musculoskeletal: No lower extremity tenderness nor edema.  No joint effusions. Neurologic:  Normal speech and language. No gross focal neurologic deficits are appreciated. No facial droop Skin:  Skin is warm, dry and intact. No rash noted. Psychiatric: Mood and affect are normal. Speech and behavior are normal.  ____________________________________________   LABS (all labs ordered are listed, but only abnormal results are displayed)  Results for orders placed or performed during the hospital encounter of 05/25/18 (from the past 24 hour(s))  Type and screen [place]     Status: None   Collection Time: 05/25/18  3:33 PM  Result Value Ref Range   ABO/RH(D) O POS    Antibody Screen NEG    Sample Expiration      05/28/2018 Performed at [facility], [address] [place], [place] [address]     Protime-INR     Status: Abnormal   Collection Time: 05/25/18  3:33 PM  Result Value Ref Range   Prothrombin Time 40.7 (H) 11.4 - 15.2 seconds   INR 4.27 (HH)   CBC with Differential/Platelet     Status: Abnormal   Collection Time: 05/25/18  3:33 PM  Result Value Ref Range   WBC 7.4 3.6 - 11.0 K/uL   RBC 3.03 (L) 3.80 - 5.20 MIL/uL   Hemoglobin 9.9 (L) 12.0 - 16.0 g/dL   HCT 28.0 (L) 35.0 - 47.0 %  MCV 92.4 80.0 - 100.0 fL   MCH 32.7 26.0 - 34.0 pg   MCHC 35.4 32.0 - 36.0 g/dL   RDW 14.0 11.5 - 14.5 %   Platelets 308 150 - 440 K/uL   Neutrophils Relative % 79 %   Neutro Abs 5.9 1.4 - 6.5 K/uL   Lymphocytes Relative 11 %   Lymphs Abs 0.8 (L) 1.0 - 3.6 K/uL   Monocytes Relative 8 %   Monocytes Absolute 0.6 0.2 - 0.9 K/uL   Eosinophils Relative 2 %   Eosinophils Absolute 0.1 0 - 0.7 K/uL   Basophils Relative 0 %   Basophils Absolute 0.0 0 - 0.1 K/uL  Comprehensive metabolic panel     Status: Abnormal   Collection Time: 05/25/18  3:33 PM  Result Value Ref Range   Sodium 140 135 - 145 mmol/L   Potassium 3.8 3.5 - 5.1 mmol/L   Chloride 105 98 - 111 mmol/L   CO2 25 22 - 32 mmol/L   Glucose, Bld 138 (H) 70 - 99 mg/dL   BUN 13 8 - 23 mg/dL   Creatinine, Ser 1.01 (H) 0.44 - 1.00 mg/dL   Calcium 8.1 (L) 8.9 - 10.3 mg/dL   Total Protein 6.0 (L) 6.5 - 8.1 g/dL   Albumin 3.1 (L) 3.5 - 5.0 g/dL   AST 17 15 - 41 U/L   ALT 7 0 - 44 U/L   Alkaline Phosphatase 63 38 - 126 U/L   Total Bilirubin 1.2 0.3 - 1.2 mg/dL   GFR calc non Af Amer 56 (L) >60 mL/min   GFR calc Af Amer >60 >60 mL/min   Anion gap 10 5 - 15  APTT     Status: Abnormal   Collection Time: 05/25/18  3:33 PM  Result Value Ref Range   aPTT 66 (H) 24 - 36 seconds  Troponin I     Status: None   Collection Time: 05/25/18  3:33 PM  Result Value Ref Range   Troponin I <0.03 <0.03 ng/mL  Prepare fresh frozen plasma     Status: None (Preliminary result)   Collection Time: 05/25/18  4:23 PM  Result Value Ref Range   Unit  Number [id number]    Blood Component Type THAWED PLASMA    Unit division 00    Status of Unit ISSUED    Transfusion Status      OK TO TRANSFUSE Performed at [facility], [address], [place], [place] [address]    ____________________________________________  EKG My review and personal interpretation at Time: 14:47   Indication: bleeding  Rate: 105  Rhythm: sinus Axis: normal Other: poor r wave progression, nonspecific st abn, no stemi ____________________________________________  RADIOLOGY  I personally reviewed all radiographic images ordered to evaluate for the above acute complaints and reviewed radiology reports and findings.  These findings were personally discussed with the patient.  Please see medical record for radiology report.  ____________________________________________   PROCEDURES  Procedure(s) performed:  .Critical Care Performed by: Merlyn Lot, MD Authorized by: Merlyn Lot, MD   Critical care provider statement:    Critical care time (minutes):  35   Critical care was necessary to treat or prevent imminent or life-threatening deterioration of the following conditions:  Metabolic crisis   Critical care was time spent personally by me on the following activities:  Discussions with consultants, evaluation of patient's response to treatment, examination of patient, ordering and performing treatments and interventions, ordering and review of laboratory studies, ordering and  review of radiographic studies, pulse oximetry, re-evaluation of patient's condition, obtaining history from patient or surrogate and review of old [place] performed: yes ____________________________________________   INITIAL IMPRESSION / ASSESSMENT AND PLAN / ED COURSE  Pertinent labs & imaging results that were available during my care of the patient were reviewed by me and considered in my medical decision making (see chart for details).    DDX: gi bleed, hemorrhoid, dehiscence, ugib, lgib  Bellany Elbaum is a 67 y.o. who presents to the ED with evidence of active GI bleeding.  Presentation complicated by recent surgical procedure.  She is on Coumadin.  She is mildly tachycardic but normotensive.  We will check blood work for the above differential.  The patient will be placed on continuous pulse oximetry and telemetry for monitoring.  Laboratory evaluation will be sent to evaluate for the above complaints.     Clinical Course as of May 26 1723  Tue May 25, 2018  1601 Spoke with Dr. Lysle Pearl of general surgery regarding the patient's presentation.  After discussion of her presentation with recent surgery will order CT imaging due to concern for extraluminal hemorrhage.  She is hemodynamically stable at this moment but has dropped 2 g in her hemoglobin.  We will give her a bolus dose of Protonix that she does have risk factors and history of GERD but this seems more clinically consistent with a lower GI bleed.   [PR]  1622 Patient with elevated INR to 4.2.  Given her active hemorrhage will reverse with FFP.   [PR]  8676 CT imaging does not show any evidence of extraluminal hemorrhage or extravasation at this point.   [PR]    Clinical Course User Index [PR] Merlyn Lot, MD     As part of my medical decision making, I reviewed the following data within the [place] notes reviewed and incorporated, Labs reviewed, notes from prior ED visits.   ____________________________________________   FINAL CLINICAL IMPRESSION(S) / ED DIAGNOSES  Final diagnoses:  Gastrointestinal hemorrhage, unspecified gastrointestinal hemorrhage type  Supratherapeutic INR      NEW MEDICATIONS STARTED DURING THIS VISIT:  New Prescriptions   No medications on file     Note:  This document was prepared using Dragon voice recognition software and may include unintentional dictation errors.    Merlyn Lot,  MD 05/25/18 [id number]

## 2018-05-25 NOTE — ED Notes (Signed)
Pt had 4.27 INR critical lab called to this RN

## 2018-05-25 NOTE — Telephone Encounter (Signed)
Called patient for reminder call, patient is on the way to the hospital due to rectal bleeding. Appointment has been cancelled for tomorrow and will call back to r/s.

## 2018-05-26 ENCOUNTER — Inpatient Hospital Stay: Payer: Self-pay

## 2018-05-26 ENCOUNTER — Encounter: Payer: Medicare Other | Admitting: Surgery

## 2018-05-26 ENCOUNTER — Inpatient Hospital Stay: Payer: Medicare Other | Admitting: Oncology

## 2018-05-26 LAB — COMPREHENSIVE METABOLIC PANEL
ALT: 8 U/L (ref 0–44)
ANION GAP: 6 (ref 5–15)
AST: 15 U/L (ref 15–41)
Albumin: 2.5 g/dL — ABNORMAL LOW (ref 3.5–5.0)
Alkaline Phosphatase: 46 U/L (ref 38–126)
BILIRUBIN TOTAL: 1 mg/dL (ref 0.3–1.2)
BUN: 16 mg/dL (ref 8–23)
CO2: 26 mmol/L (ref 22–32)
Calcium: 7.7 mg/dL — ABNORMAL LOW (ref 8.9–10.3)
Chloride: 110 mmol/L (ref 98–111)
Creatinine, Ser: 1.01 mg/dL — ABNORMAL HIGH (ref 0.44–1.00)
GFR, EST NON AFRICAN AMERICAN: 56 mL/min — AB (ref >60)
Glucose, Bld: 104 mg/dL — ABNORMAL HIGH (ref 70–99)
POTASSIUM: 3.1 mmol/L — AB (ref 3.5–5.1)
Sodium: 142 mmol/L (ref 135–145)
TOTAL PROTEIN: 5.3 g/dL — AB (ref 6.5–8.1)

## 2018-05-26 LAB — PROTIME-INR
INR: 3.07
INR: 1.89
PROTHROMBIN TIME: 21.5 s — AB (ref 11.4–15.2)
Prothrombin Time: 31.5 seconds — ABNORMAL HIGH (ref 11.4–15.2)

## 2018-05-26 LAB — CBC
HCT: 15.8 % — ABNORMAL LOW (ref 35.0–47.0)
HEMATOCRIT: 17.6 % — AB (ref 35.0–47.0)
HEMOGLOBIN: 5.7 g/dL — AB (ref 12.0–16.0)
Hemoglobin: 6.2 g/dL — ABNORMAL LOW (ref 12.0–16.0)
MCH: 32.4 pg (ref 26.0–34.0)
MCH: 32.9 pg (ref 26.0–34.0)
MCHC: 35.1 g/dL (ref 32.0–36.0)
MCHC: 35.8 g/dL (ref 32.0–36.0)
MCV: 92.1 fL (ref 80.0–100.0)
MCV: 92 fL (ref 80.0–100.0)
PLATELETS: 255 10*3/uL (ref 150–440)
Platelets: 201 10*3/uL (ref 150–440)
RBC: 1.91 MIL/uL — AB (ref 3.80–5.20)
RBC: 1.72 MIL/uL — AB (ref 3.80–5.20)
RDW: 14.1 % (ref 11.5–14.5)
RDW: 13.9 % (ref 11.5–14.5)
WBC: 5.1 10*3/uL (ref 3.6–11.0)
WBC: 3 10*3/uL — ABNORMAL LOW (ref 3.6–11.0)

## 2018-05-26 LAB — BPAM FFP
Blood Product Expiration Date: 201907282359
ISSUE DATE / TIME: 201907231723
Unit Type and Rh: 5100

## 2018-05-26 LAB — PREPARE FRESH FROZEN PLASMA: UNIT DIVISION: 0

## 2018-05-26 LAB — PREPARE RBC (CROSSMATCH)

## 2018-05-26 LAB — ABO/RH: ABO/RH(D): O POS

## 2018-05-26 MED ORDER — POTASSIUM CHLORIDE 10 MEQ/100ML IV SOLN: 10.0000 meq | INTRAVENOUS | Status: DC

## 2018-05-26 MED ORDER — POTASSIUM CHLORIDE 10 MEQ/100ML IV SOLN
10.0000 meq | INTRAVENOUS | Status: AC
  Administered 2018-05-26 (×4): 10 meq via INTRAVENOUS
  Filled 2018-05-26 (×4): qty 100

## 2018-05-26 MED ORDER — SODIUM CHLORIDE 0.9% IV SOLUTION
Freq: Once | INTRAVENOUS | Status: AC
Start: 2018-05-26 — End: 2018-05-26
  Administered 2018-05-26: 10:00:00 via INTRAVENOUS

## 2018-05-26 MED ORDER — ACETAMINOPHEN 325 MG PO TABS
650.0000 mg | ORAL_TABLET | Freq: Four times a day (QID) | ORAL | Status: DC | PRN
  Administered 2018-05-26: 650 mg via ORAL
  Filled 2018-05-26: qty 2

## 2018-05-26 MED ORDER — SODIUM CHLORIDE 0.9% IV SOLUTION: Freq: Once | INTRAVENOUS | Status: DC

## 2018-05-26 MED ORDER — VITAMIN K1 10 MG/ML IJ SOLN
10.0000 mg | Freq: Once | INTRAVENOUS | Status: AC
  Administered 2018-05-26: 10 mg via INTRAVENOUS
  Filled 2018-05-26: qty 1

## 2018-05-26 MED ORDER — SODIUM CHLORIDE 0.9% FLUSH: 10.0000 mL | INTRAVENOUS | Status: DC | PRN

## 2018-05-26 NOTE — H&P (Signed)
Subjective:   LATE ENTRY  CC: BRBPR  HPI:  Carly Khan is a 67 y.o. female who is consulted for evaluation of  BRBPR.   Symptoms were first noted a few days ago. Episodes not associated with pain but she still has some incisional pain from ileostomy reversal.  Noted to have low hgb so surgery consulted.  Associated with syncopal with intial workup negative in ED.  Currently asymptomatic  Past Medical History:  has a past medical history of CAD in native artery (1991), Cancer of left lung ([place]) (2016), Chronic Dyspnea on exertion, CKD (chronic kidney disease) stage 3, GFR 30-59 ml/min ([place]) (04/09/2017), Colonic fistula (02/28/2017), Diverticulitis, Diverticulitis, Dyspnea, Essential hypertension (04/09/2017), Family history of adverse reaction to anesthesia, History of sebaceous cyst (02/28/2017), History of stress test, Intermittent vertigo (04/09/2017), Itching (02/28/2017), Left Atrial Appendage Thrombus, PAF (paroxysmal atrial fibrillation) ([place]), and Stroke ([place]).  Past Surgical History:  has a past surgical history that includes cardiac bypass (1990); carpel tunnel; Colon surgery; Colonoscopy; Exploratory laparotomy; Sigmoid resection / rectopexy; vaginectomy; loop ileostomy; Gallbladder surgery; CT guided needle placement; Thoracoscopy; TEE without cardioversion (N/A, 05/04/2017); Cholecystectomy; Abdominal hysterectomy; Coronary artery bypass graft (1990's); Colonoscopy with propofol (N/A, 03/03/2018); Esophagogastroduodenoscopy (egd) with propofol (03/03/2018); Ileostomy closure (Right, 05/18/2018); and Parastomal hernia repair (Right, 05/18/2018).  Family History: family history includes Cancer in her mother; Cervical cancer in her sister; Heart disease in her father; Hypertension in her father; Parkinson's disease in her sister; Stomach cancer in her mother.  Social History:  reports that she has been smoking.  She has a 20.00 pack-year smoking history. She has never used smokeless tobacco. She reports  that she does not drink alcohol or use drugs.  Current Medications: see MAR  Allergies:  Allergies as of 05/25/2018 - Review Complete 05/25/2018  Allergen Reaction Noted  . Penicillin g Hives and Itching 04/09/2017  . Succinylcholine  07/14/2017    ROS:  A 15 point review of systems was performed and was negative except as noted in HPI    Objective:     BP 97/67 (BP Location: Right Arm)   Pulse (!) 103   Temp 98.5 F (36.9 C) (Oral)   Resp 18   Ht 5\' 4"  (1.626 m)   Wt 60.1 kg (132 lb 9.6 oz)   SpO2 100%   BMI 22.76 kg/m    Constitutional :  alert, cooperative and no distress  Lymphatics/Throat:  no asymmetry, masses, or scars  Respiratory:  clear to auscultation bilaterally  Cardiovascular:  irregularly irregular rhythm  Gastrointestinal: soft, non-tender; bowel sounds normal; no masses,  no organomegaly and ventral hernia present, reducible and soft.   Musculoskeletal: Steady gait and movement  Skin: Cool and moist, surgical incision site dressings intact  Psychiatric: Normal affect, non-agitated, not confused       LABS:  CMP Latest Ref Rng & Units 05/26/2018 05/25/2018 05/19/2018  Glucose 70 - 99 mg/dL 104(H) 138(H) 132(H)  BUN 8 - 23 mg/dL 16 13 9   Creatinine 0.44 - 1.00 mg/dL 1.01(H) 1.01(H) 1.10(H)  Sodium 135 - 145 mmol/L 142 140 136  Potassium 3.5 - 5.1 mmol/L 3.1(L) 3.8 4.1  Chloride 98 - 111 mmol/L 110 105 107  CO2 22 - 32 mmol/L 26 25 23   Calcium 8.9 - 10.3 mg/dL 7.7(L) 8.1(L) 8.2(L)  Total Protein 6.5 - 8.1 g/dL 5.3(L) 6.0(L) -  Total Bilirubin 0.3 - 1.2 mg/dL 1.0 1.2 -  Alkaline Phos 38 - 126 U/L 46 63 -  AST 15 - 41  U/L 15 17 -  ALT 0 - 44 U/L 8 7 -   CBC Latest Ref Rng & Units 05/26/2018 05/25/2018 05/25/2018  WBC 3.6 - 11.0 K/uL 5.1 4.4 7.4  Hemoglobin 12.0 - 16.0 g/dL 6.2(L) 7.4(L) 9.9(L)  Hematocrit 35.0 - 47.0 % 17.6(L) 21.3(L) 28.0(L)  Platelets 150 - 440 K/uL 255 249 308     RADS: CLINICAL DATA:  Rectal bleeding.  Status post ileostomy  reversal.  EXAM: CT ABDOMEN AND PELVIS WITH CONTRAST  TECHNIQUE: Multidetector CT imaging of the abdomen and pelvis was performed using the standard protocol following bolus administration of intravenous contrast.  CONTRAST:  167mL ISOVUE-300 IOPAMIDOL (ISOVUE-300) INJECTION 61%  COMPARISON:  CT scan of April 29, 2018.  FINDINGS: Lower chest: No acute abnormality.  Hepatobiliary: Status post cholecystectomy. Nodular hepatic margins are noted concerning for hepatic cirrhosis.  Pancreas: Unremarkable. No pancreatic ductal dilatation or surrounding inflammatory changes.  Spleen: Normal in size without focal abnormality.  Adrenals/Urinary Tract: Adrenal glands appear normal. Small right renal cyst is noted. Nonobstructive left nephrolithiasis is noted. No hydronephrosis or renal obstruction is noted. Urinary bladder is decompressed.  Stomach/Bowel: Status post reversal of right lower quadrant ileostomy. Stomach appears normal. Surgical drain remains within previous ostomy site seen in right lower quadrant. Mild wall thickening of several small bowel loops are noted in the right lower quadrant most consistent with enteritis.  Vascular/Lymphatic: Aortic atherosclerosis. No enlarged abdominal or pelvic lymph nodes.  Reproductive: Status post hysterectomy. No adnexal masses.  Other: Stable moderate size ventral hernia is noted which contains loops of small bowel, but does not result in incarceration or obstruction.  Musculoskeletal: No acute or significant osseous findings.  IMPRESSION: Status post reversal of ileostomy since prior exam. Mild wall and fold thickening of several small bowel loops are noted in the right lower quadrant most consistent with enteritis.  Findings consistent with hepatic cirrhosis.  Nonobstructive left nephrolithiasis.  Stable moderate size ventral hernia is noted which contains loops of bowel, but does not result in  incarceration or obstruction.  Aortic Atherosclerosis (ICD10-I70.0).   Electronically Signed   By: Marijo Conception, M.D.  Assessment:      BRBPR secondary to supratherapeutic INR, on coumadin for afib  Plan:     FFP given in ED, will monitor with serial hgb and INR check to control bleeding.  She is POD 7 (7/16) from ileostomy takedown and parastomal hernia repair, so any sort of endoscopic/operative intervention will be absolute last resort.  Admit: med/surg, possible tele if needed due to hx of afib IVF: LR Abx: none DVT prophy: hold lovenox, cont SCDs GI prophy: pepcid Diet: NPO, sips with meds Pain control: as needed I&O: qshift Activity: adlib Labs: cbc, INR

## 2018-05-26 NOTE — Progress Notes (Signed)
Seen and examined Coagulopathic Needs FFP and Vit k Ct reviewed  PE  NAD Abd: soft, nt , incisions healing well No peritonitis  A/p Correct coagulopathy and transfuse  No surgical intervention

## 2018-05-26 NOTE — Progress Notes (Signed)
Peripherally Inserted Central Catheter/Midline Placement  The IV Nurse has discussed with the patient and/or persons authorized to consent for the patient, the purpose of this procedure and the potential benefits and risks involved with this procedure.  The benefits include less needle sticks, lab draws from the catheter, and the patient may be discharged home with the catheter. Risks include, but not limited to, infection, bleeding, blood clot (thrombus formation), and puncture of an artery; nerve damage and irregular heartbeat and possibility to perform a PICC exchange if needed/ordered by physician.  Alternatives to this procedure were also discussed.  Bard Power PICC patient education guide, fact sheet on infection prevention and patient information card has been provided to patient /or left at bedside.    PICC/Midline Placement Documentation  PICC Double Lumen 05/26/18 PICC Right Brachial 32 cm 0 cm (Active)  Indication for Insertion or Continuance of Line Prolonged intravenous therapies 05/26/2018  5:07 PM  Exposed Catheter (cm) 0 cm 05/26/2018  5:07 PM  Site Assessment Clean;Dry;Intact 05/26/2018  5:07 PM  Lumen #1 Status Flushed 05/26/2018  5:07 PM  Lumen #2 Status Blood return noted 05/26/2018  5:07 PM  Dressing Type Transparent 05/26/2018  5:07 PM  Dressing Status Intact;Dry;Clean 05/26/2018  5:07 PM  Dressing Intervention New dressing 05/26/2018  5:07 PM  Dressing Change Due 06/02/18 05/26/2018  5:07 PM       [address] 05/26/2018, 5:10 PM

## 2018-05-26 NOTE — Progress Notes (Signed)
Spoke with Dr.Sakai this morning regarding hemoglobin of 6.2 . Dr. Lysle Pearl to put in orders for blood transfusion. Carly Khan

## 2018-05-27 DIAGNOSIS — K922 Gastrointestinal hemorrhage, unspecified: Secondary | ICD-10-CM

## 2018-05-27 LAB — BASIC METABOLIC PANEL
Anion gap: 8 (ref 5–15)
BUN: 15 mg/dL (ref 8–23)
CHLORIDE: 104 mmol/L (ref 98–111)
CO2: 28 mmol/L (ref 22–32)
CREATININE: 0.92 mg/dL (ref 0.44–1.00)
Calcium: 8.1 mg/dL — ABNORMAL LOW (ref 8.9–10.3)
GFR calc Af Amer: >60 mL/min (ref >60)
GFR calc non Af Amer: >60 mL/min (ref >60)
GLUCOSE: 120 mg/dL — AB (ref 70–99)
POTASSIUM: 3.2 mmol/L — AB (ref 3.5–5.1)
SODIUM: 140 mmol/L (ref 135–145)

## 2018-05-27 LAB — COMPREHENSIVE METABOLIC PANEL
ALBUMIN: 2.9 g/dL — AB (ref 3.5–5.0)
ALK PHOS: 48 U/L (ref 38–126)
ALT: 8 U/L (ref 0–44)
ANION GAP: 9 (ref 5–15)
AST: 16 U/L (ref 15–41)
BUN: 17 mg/dL (ref 8–23)
CO2: 26 mmol/L (ref 22–32)
Calcium: 8.1 mg/dL — ABNORMAL LOW (ref 8.9–10.3)
Chloride: 108 mmol/L (ref 98–111)
Creatinine, Ser: 0.88 mg/dL (ref 0.44–1.00)
GFR calc Af Amer: >60 mL/min (ref >60)
GFR calc non Af Amer: >60 mL/min (ref >60)
Glucose, Bld: 67 mg/dL — ABNORMAL LOW (ref 70–99)
POTASSIUM: 3.2 mmol/L — AB (ref 3.5–5.1)
SODIUM: 143 mmol/L (ref 135–145)
Total Bilirubin: 1.5 mg/dL — ABNORMAL HIGH (ref 0.3–1.2)
Total Protein: 5.8 g/dL — ABNORMAL LOW (ref 6.5–8.1)

## 2018-05-27 LAB — CBC
HCT: 20 % — ABNORMAL LOW (ref 35.0–47.0)
HCT: 25.7 % — ABNORMAL LOW (ref 35.0–47.0)
HEMOGLOBIN: 7.1 g/dL — AB (ref 12.0–16.0)
HEMOGLOBIN: 9.2 g/dL — AB (ref 12.0–16.0)
MCH: 31.6 pg (ref 26.0–34.0)
MCH: 31.3 pg (ref 26.0–34.0)
MCHC: 35.4 g/dL (ref 32.0–36.0)
MCHC: 35.8 g/dL (ref 32.0–36.0)
MCV: 89.2 fL (ref 80.0–100.0)
MCV: 87.3 fL (ref 80.0–100.0)
Platelets: 176 10*3/uL (ref 150–440)
Platelets: 186 10*3/uL (ref 150–440)
RBC: 2.24 MIL/uL — ABNORMAL LOW (ref 3.80–5.20)
RBC: 2.95 MIL/uL — AB (ref 3.80–5.20)
RDW: 14.7 % — AB (ref 11.5–14.5)
RDW: 14.9 % — ABNORMAL HIGH (ref 11.5–14.5)
WBC: 3.2 10*3/uL — AB (ref 3.6–11.0)
WBC: 5.8 10*3/uL (ref 3.6–11.0)

## 2018-05-27 LAB — BPAM FFP
BLOOD PRODUCT EXPIRATION DATE: 201907292359
Blood Product Expiration Date: 201907292359
ISSUE DATE / TIME: 201907241020
ISSUE DATE / TIME: 201907241324
UNIT TYPE AND RH: 5100
Unit Type and Rh: 5100

## 2018-05-27 LAB — PROTIME-INR
INR: 1.18
Prothrombin Time: 14.9 seconds (ref 11.4–15.2)

## 2018-05-27 LAB — PREPARE FRESH FROZEN PLASMA

## 2018-05-27 MED ORDER — POTASSIUM CHLORIDE CRYS ER 20 MEQ PO TBCR
40.0000 meq | EXTENDED_RELEASE_TABLET | Freq: Two times a day (BID) | ORAL | Status: AC
  Administered 2018-05-27 (×2): 40 meq via ORAL
  Filled 2018-05-27 (×2): qty 2

## 2018-05-27 NOTE — Progress Notes (Signed)
CC: GI bleed Subjective: Better this morning.  She completed 2 units of RBCs.  The hemoglobin this morning 7.1 this was only after 1 unit.  When I saw her this morning she was still receiving the second unit of blood.  She reports no melena this morning.  She did have one episode yesterday. Abdominal pain.  She is hungry this morning. HD adequate  Objective: Vital signs in last 24 hours: Temp:  [98.2 F (36.8 C)-100.1 F (37.8 C)] 98.2 F (36.8 C) (07/25 0931) Pulse Rate:  [71-95] 72 (07/25 0931) Resp:  [16-24] 18 (07/25 0931) BP: (105-171)/(47-77) 171/68 (07/25 0931) SpO2:  [98 %-100 %] 100 % (07/25 0931) Last BM Date: 05/26/18  Intake/Output from previous day: 07/24 0701 - 07/25 0700 In: 1812 [I.V.:565; Blood:803; IV Piggyback:444] Out: -  Intake/Output this shift: Total I/O In: 2513.3 [I.V.:2138.3; Blood:375] Out: -   Physical exam:  NAD, alert Abd: soft, incision c/d/i, penrose removed and dry dressing will be applied by RN. No peritonitis or infection Ext: no edema and well perfused  Lab Results: CBC  Recent Labs    05/26/18 1414 05/27/18 0346  WBC 3.0* 3.2*  HGB 5.7* 7.1*  HCT 15.8* 20.0*  PLT 201 176   BMET Recent Labs    05/26/18 0545 05/27/18 0346  NA 142 143  K 3.1* 3.2*  CL 110 108  CO2 26 26  GLUCOSE 104* 67*  BUN 16 17  CREATININE 1.01* 0.88  CALCIUM 7.7* 8.1*   PT/INR Recent Labs    05/26/18 1722 05/27/18 0346  LABPROT 21.5* 14.9  INR 1.89 1.18   ABG No results for input(s): PHART, HCO3 in the last 72 hours.  Invalid input(s): PCO2, PO2  Studies/Results: Ct Abdomen Pelvis W Contrast  Result Date: 05/25/2018 CLINICAL DATA:  Rectal bleeding.  Status post ileostomy reversal. EXAM: CT ABDOMEN AND PELVIS WITH CONTRAST TECHNIQUE: Multidetector CT imaging of the abdomen and pelvis was performed using the standard protocol following bolus administration of intravenous contrast. CONTRAST:  175mL ISOVUE-300 IOPAMIDOL (ISOVUE-300) INJECTION  61% COMPARISON:  CT scan of April 29, 2018. FINDINGS: Lower chest: No acute abnormality. Hepatobiliary: Status post cholecystectomy. Nodular hepatic margins are noted concerning for hepatic cirrhosis. Pancreas: Unremarkable. No pancreatic ductal dilatation or surrounding inflammatory changes. Spleen: Normal in size without focal abnormality. Adrenals/Urinary Tract: Adrenal glands appear normal. Small right renal cyst is noted. Nonobstructive left nephrolithiasis is noted. No hydronephrosis or renal obstruction is noted. Urinary bladder is decompressed. Stomach/Bowel: Status post reversal of right lower quadrant ileostomy. Stomach appears normal. Surgical drain remains within previous ostomy site seen in right lower quadrant. Mild wall thickening of several small bowel loops are noted in the right lower quadrant most consistent with enteritis. Vascular/Lymphatic: Aortic atherosclerosis. No enlarged abdominal or pelvic lymph nodes. Reproductive: Status post hysterectomy. No adnexal masses. Other: Stable moderate size ventral hernia is noted which contains loops of small bowel, but does not result in incarceration or obstruction. Musculoskeletal: No acute or significant osseous findings. IMPRESSION: Status post reversal of ileostomy since prior exam. Mild wall and fold thickening of several small bowel loops are noted in the right lower quadrant most consistent with enteritis. Findings consistent with hepatic cirrhosis. Nonobstructive left nephrolithiasis. Stable moderate size ventral hernia is noted which contains loops of bowel, but does not result in incarceration or obstruction. Aortic Atherosclerosis (ICD10-I70.0). Electronically Signed   By: Marijo Conception, M.D.   On: 05/25/2018 16:44   [place] Ekg Site Rite  Result Date: 05/26/2018 If  Site Rite image not attached, placement could not be confirmed due to current cardiac rhythm.   Anti-infectives: Anti-infectives (From admission, onward)   None       Assessment/Plan:  GI bleed from anticoagulation Responded to FFP and RBC Doing well Start clears No need for surgical intervention at this time  Caroleen Hamman, MD, [facility]  05/27/2018

## 2018-05-27 NOTE — Plan of Care (Signed)
Patient received unit of packed RBC's and FFP. Labs pending. Upgraded diet to full liquid. NO rectal bleeding.

## 2018-05-28 LAB — COMPREHENSIVE METABOLIC PANEL
ALT: 7 U/L (ref 0–44)
ANION GAP: 5 (ref 5–15)
AST: 18 U/L (ref 15–41)
Albumin: 2.9 g/dL — ABNORMAL LOW (ref 3.5–5.0)
Alkaline Phosphatase: 54 U/L (ref 38–126)
BILIRUBIN TOTAL: 1.3 mg/dL — AB (ref 0.3–1.2)
BUN: 11 mg/dL (ref 8–23)
CHLORIDE: 105 mmol/L (ref 98–111)
CO2: 29 mmol/L (ref 22–32)
Calcium: 8 mg/dL — ABNORMAL LOW (ref 8.9–10.3)
Creatinine, Ser: 0.92 mg/dL (ref 0.44–1.00)
GLUCOSE: 85 mg/dL (ref 70–99)
POTASSIUM: 4.1 mmol/L (ref 3.5–5.1)
Sodium: 139 mmol/L (ref 135–145)
Total Protein: 5.7 g/dL — ABNORMAL LOW (ref 6.5–8.1)

## 2018-05-28 LAB — BPAM FFP
BLOOD PRODUCT EXPIRATION DATE: 201907302359
Blood Product Expiration Date: 201907292359
ISSUE DATE / TIME: 201907250130
ISSUE DATE / TIME: 201907251234
UNIT TYPE AND RH: 5100
Unit Type and Rh: 5100

## 2018-05-28 LAB — CBC
HEMATOCRIT: 26 % — AB (ref 35.0–47.0)
Hemoglobin: 9.2 g/dL — ABNORMAL LOW (ref 12.0–16.0)
MCH: 30.9 pg (ref 26.0–34.0)
MCHC: 35.2 g/dL (ref 32.0–36.0)
MCV: 87.9 fL (ref 80.0–100.0)
PLATELETS: 210 10*3/uL (ref 150–440)
RBC: 2.96 MIL/uL — ABNORMAL LOW (ref 3.80–5.20)
RDW: 14.9 % — AB (ref 11.5–14.5)
WBC: 5 10*3/uL (ref 3.6–11.0)

## 2018-05-28 LAB — TYPE AND SCREEN
ABO/RH(D): O POS
Antibody Screen: NEGATIVE
Unit division: 0
Unit division: 0

## 2018-05-28 LAB — BPAM RBC
BLOOD PRODUCT EXPIRATION DATE: 201908162359
BLOOD PRODUCT EXPIRATION DATE: 201908182359
ISSUE DATE / TIME: 201907241540
ISSUE DATE / TIME: 201907250451
UNIT TYPE AND RH: 5100
UNIT TYPE AND RH: 5100

## 2018-05-28 LAB — PROTIME-INR
INR: 1.17
PROTHROMBIN TIME: 14.8 s (ref 11.4–15.2)

## 2018-05-28 LAB — PREPARE FRESH FROZEN PLASMA
UNIT DIVISION: 0
Unit division: 0

## 2018-05-28 MED ORDER — FLUTICASONE FUROATE-VILANTEROL 100-25 MCG/INH IN AEPB
1.0000 | INHALATION_SPRAY | Freq: Two times a day (BID) | RESPIRATORY_TRACT | Status: DC
  Administered 2018-05-28 – 2018-05-29 (×2): 1 via RESPIRATORY_TRACT
  Filled 2018-05-28: qty 28

## 2018-05-28 MED ORDER — FLUTICASONE FUROATE-VILANTEROL 100-25 MCG/INH IN AEPB
1.0000 | INHALATION_SPRAY | Freq: Every day | RESPIRATORY_TRACT | Status: DC
  Administered 2018-05-28: 1 via RESPIRATORY_TRACT
  Filled 2018-05-28: qty 28

## 2018-05-28 NOTE — Care Management (Signed)
Patient open with [facility] home [facility] for RN. PT has assessed patient and recommends home health PT.  Will need orders at discharge.   Sharyn Lull with [facility] aware of admission.

## 2018-05-28 NOTE — Evaluation (Signed)
Physical Therapy Evaluation Patient Details Name: Carly Khan MRN: [id number] DOB: October 05, 1951 Today's Date: 05/28/2018   History of Present Illness  Patient is 67 yo female presented to ED after syncopal event, has recent hospital admission for ileostomy reversal parastomal hernia (05/18/18). GI bleed, received 2 units of RBCs 05/27/18.  PMH of CAD, lung cancer, chronic dyspnea on exertion, CKD stage 3, diverticulitis, HTN, vertigo, PAF, stroke, current smoker  Clinical Impression  Patient alert and oriented at start of session, agreeable to PT, family at bedside. States she does not really have any pain, unable to quantify. Patient reports living in 1 story home with daughter, with an aide 1x a week for assistance with IADLs, and cooking/cleaning as needed. Currently ambulates with SPC and independent with ADLs. Patient reports 1 fall in the last 6 months.The patient demonstrated bed mobility mod I, sit <> stand from bed/commode with SPC, and ambulated in hall with [facility] ~245ft and supervision/CGA. Patient exhibits some drifting R/L with ambulation, some unsteadiness, difficulty with talking/ambulating simultaneously (weaving gait or stops to talk), as well as UE/LE strength deficits. The patient would benefit from further skilled PT to address these deficits to maximize independence, mobility, and decrease risk of falls.    Follow Up Recommendations Home health PT    Equipment Recommendations  None recommended by PT    Recommendations for Other Services       Precautions / Restrictions Precautions Precautions: Fall Restrictions Weight Bearing Restrictions: No      Mobility  Bed Mobility Overal bed mobility: Modified Independent                Transfers Overall transfer level: Modified independent Equipment used: Straight cane             General transfer comment: Patient demonstrated sit <> stand transfers from bed and from commode this session. Use of grab bars with  commode transfers  Ambulation/Gait Ambulation/Gait assistance: Supervision;Min guard Gait Distance (Feet): 200 Feet Assistive device: Straight cane Gait Pattern/deviations: Drifts right/left;Wide base of support     General Gait Details: Patient demonstrates some drifting R/L with ambulation, some unsteadiness, difficulty with talking/ambulating simultaneously (weaving gait or stops to talk).  Stairs            Wheelchair Mobility    Modified Rankin (Stroke Patients Only)       Balance Overall balance assessment: Needs assistance Sitting-balance support: Feet supported Sitting balance-Leahy Scale: Good       Standing balance-Leahy Scale: Good                               Pertinent Vitals/Pain Pain Assessment: Faces Pain Score: 0-No pain    Home Living Family/patient expects to be discharged to:: Private residence Living Arrangements: Children Available Help at Discharge: Personal care attendant;Family Type of Home: House Home Access: Stairs to enter Entrance Stairs-Rails: None Entrance Stairs-Number of Steps: 3 Home Layout: One level Home Equipment: Cane - single point Additional Comments: Patient reports falling 1x in the last 6 months    Prior Function Level of Independence: Independent with assistive device(s)         Comments: Patient ambulates in home with cane of by holding until furniture/walls. Patient states an aide comes 1x a week for assistance with IADLs, cooking and cleaning as needed. Able to perform ADLs independently.     Hand Dominance        Extremity/Trunk Assessment   Upper Extremity  Assessment Upper Extremity Assessment: Generalized weakness;RUE deficits/detail;LUE deficits/detail;Defer to OT evaluation RUE Deficits / Details: grossly 4/5 LUE Deficits / Details: grossly 4/5    Lower Extremity Assessment Lower Extremity Assessment: RLE deficits/detail;LLE deficits/detail RLE Deficits / Details: 4-/5  grossly LLE Deficits / Details: 4-/5 grossly       Communication   Communication: No difficulties  Cognition Arousal/Alertness: Awake/alert Behavior During Therapy: WFL for tasks assessed/performed Overall Cognitive Status: Within Functional Limits for tasks assessed                                        General Comments      Exercises     Assessment/Plan    PT Assessment Patient needs continued PT services  PT Problem List Decreased strength;Decreased activity tolerance;Decreased safety awareness;Decreased balance;Decreased mobility       PT Treatment Interventions DME instruction;Balance training;Neuromuscular re-education;Gait training;Functional mobility training;Stair training;Patient/family education;Therapeutic activities;Therapeutic exercise    PT Goals (Current goals can be found in the Care Plan section)  Acute Rehab PT Goals Patient Stated Goal: Patient would like to return home PT Goal Formulation: With patient Time For Goal Achievement: 06/11/18 Potential to Achieve Goals: Good    Frequency Min 2X/week   Barriers to discharge        Co-evaluation               AM-PAC PT "6 Clicks" Daily Activity  Outcome Measure Difficulty turning over in bed (including adjusting bedclothes, sheets and blankets)?: None Difficulty moving from lying on back to sitting on the side of the bed? : None Difficulty sitting down on and standing up from a chair with arms (e.g., wheelchair, bedside commode, etc,.)?: A Little Help needed moving to and from a bed to chair (including a wheelchair)?: None Help needed walking in hospital room?: A Little Help needed climbing 3-5 steps with a railing? : A Little 6 Click Score: 21    End of Session Equipment Utilized During Treatment: Gait belt Activity Tolerance: Patient tolerated treatment well Patient left: in bed;with family/visitor present;with call bell/phone within reach Nurse Communication: Mobility  status PT Visit Diagnosis: Unsteadiness on feet (R26.81);Other abnormalities of gait and mobility (R26.89);History of falling (Z91.81)    Time: 9833-8250 PT Time Calculation (min) (ACUTE ONLY): 28 min   Charges:   PT Evaluation $PT Eval Low Complexity: 1 Low PT Treatments $Therapeutic Activity: 8-22 mins       Lieutenant Diego PT, DPT 3:26 PM,05/28/18 [phone]

## 2018-05-28 NOTE — Care Management Important Message (Signed)
Copy of signed IM left with patient in room.  

## 2018-05-28 NOTE — Progress Notes (Signed)
Per MD okay for RN to order breo ellipta. Twice a day. Pt takes it at home.

## 2018-05-28 NOTE — Progress Notes (Signed)
CC: GI bleed Subjective: Tolerated clear liquid diet.  No nausea.  Apparently he had some nausea yesterday but this morning feels hungry and wants to advance to a regular diet.  No more melena.  Hemoglobin as well as coags are normal.  Objective: Vital signs in last 24 hours: Temp:  [98.2 F (36.8 C)-98.4 F (36.9 C)] 98.4 F (36.9 C) (07/26 1259) Pulse Rate:  [60-74] 74 (07/26 1259) Resp:  [16-20] 17 (07/26 1259) BP: (124-158)/(56-89) 124/76 (07/26 1259) SpO2:  [99 %-100 %] 99 % (07/26 1259) Last BM Date: 05/28/18  Intake/Output from previous day: 07/25 0701 - 07/26 0700 In: 3057.1 [P.O.:240; I.V.:2163.8; Blood:653.3] Out: 0  Intake/Output this shift: Total I/O In: 240 [P.O.:240] Out: 500 [Urine:500]  Physical exam: NAD, awake and alert Abd: soft, incisions c/d/i, no infection, no peritonitis. Large reducible ventral hernia Ext: well perfused and no edema  Lab Results: CBC  Recent Labs    05/27/18 1516 05/28/18 0636  WBC 5.8 5.0  HGB 9.2* 9.2*  HCT 25.7* 26.0*  PLT 186 210   BMET Recent Labs    05/27/18 1516 05/28/18 0636  NA 140 139  K 3.2* 4.1  CL 104 105  CO2 28 29  GLUCOSE 120* 85  BUN 15 11  CREATININE 0.92 0.92  CALCIUM 8.1* 8.0*   PT/INR Recent Labs    05/27/18 0346 05/28/18 0636  LABPROT 14.9 14.8  INR 1.18 1.17   ABG No results for input(s): PHART, HCO3 in the last 72 hours.  Invalid input(s): PCO2, PO2  Studies/Results: [place] Ekg Site Rite  Result Date: 05/26/2018 If Site Rite image not attached, placement could not be confirmed due to current cardiac rhythm.   Anti-infectives: Anti-infectives (From admission, onward)   None      Assessment/Plan:  I believe resolved after correction of coagulopathy. We will hold off any Coumadin at least for about a month.  She is at high risk for rebleeding. discussed with the patient in detail about risks and benefits of holding anticoagulation.  At this time we do have evidence of a  significant bleed and the safest thing for the patient is to hold off on the Coumadin at the expense of potential stroke from her clot. We will advance her to regular diet and plan for discharge her tomorrow.  Caroleen Hamman, MD, [facility]  05/28/2018

## 2018-05-29 NOTE — Progress Notes (Signed)
Carly Khan  A and O x 4. VSS. Pt tolerating diet well. No complaints of pain or nausea. IV removed intact, prescriptions given. Pt voiced understanding of discharge instructions with no further questions. Pt discharged via wheelchair with nurse tech.     Allergies as of 05/29/2018      Reactions   Penicillin G Hives, Itching   Has patient had a PCN reaction causing immediate rash, facial/tongue/throat swelling, SOB or lightheadedness with hypotension: Yes Has patient had a PCN reaction causing severe rash involving mucus membranes or skin necrosis: No Has patient had a PCN reaction that required hospitalization: No Has patient had a PCN reaction occurring within the last 10 years: Yes If all of the above answers are "NO", then may proceed with Cephalosporin use.   Succinylcholine    Patient's daughter had emergency surgery and was awake for the entire procedure, was told she should not have succ's and neither should her mom      Medication List    STOP taking these medications   enoxaparin 60 MG/0.6ML injection Commonly known as:  LOVENOX   warfarin 5 MG tablet Commonly known as:  COUMADIN     TAKE these medications   acetaminophen 500 MG tablet Commonly known as:  TYLENOL Take 2 tablets (1,000 mg total) by mouth every 6 (six) hours as needed for mild pain, fever or headache.   BREO ELLIPTA 100-25 MCG/INH Aepb Generic drug:  fluticasone furoate-vilanterol Inhale 1 puff into the lungs 2 (two) times daily.   ibuprofen 600 MG tablet Commonly known as:  ADVIL,MOTRIN Take 1 tablet (600 mg total) by mouth every 8 (eight) hours as needed for moderate pain.   metoprolol succinate 25 MG 24 hr tablet Commonly known as:  TOPROL-XL Take 1 tablet (25 mg total) by mouth daily.   omeprazole 20 MG capsule Commonly known as:  PRILOSEC Take 1 capsule (20 mg total) by mouth 2 (two) times daily before a meal.   oxyCODONE 5 MG immediate release tablet Commonly known as:  Oxy  IR/ROXICODONE Take 1 tablet (5 mg total) by mouth every 6 (six) hours as needed for severe pain.   polyethylene glycol packet Commonly known as:  MIRALAX / GLYCOLAX Take 17 g by mouth daily as needed for mild constipation.   pravastatin 10 MG tablet Commonly known as:  PRAVACHOL Take 10 mg by mouth daily.   psyllium 95 % Pack Commonly known as:  HYDROCIL/METAMUCIL Take 1 packet by mouth daily.       Vitals:   05/29/18 0608 05/29/18 0840  BP: 135/72 (!) 122/99  Pulse: (!) 56 (!) 58  Resp: (!) 24   Temp: (!) 97.5 F (36.4 C)   SpO2: 100%     Francesco Sor

## 2018-05-29 NOTE — Care Management Note (Signed)
Case Management Note  Patient Details  Name: Carly Khan MRN: [id number] Date of Birth: May 21, 1951  Subjective/Objective:    Patient to be discharged per MD order. Patient already open with [facility]. Confirmed with patient that she would like to resume these services. Referral placed with Sharyn Lull who agrees to resume services with PT and RN. No DME needs. Family to provide transport.  Ines Bloomer RN BSN RNCM [phone]                 Action/Plan:   Expected Discharge Date:  05/29/18               Expected Discharge Plan:     In-House Referral:     Discharge planning Services  CM Consult  Post Acute Care Choice:  Home Health, Resumption of Svcs/PTA Provider Choice offered to:  Patient  DME Arranged:    DME Agency:     HH Arranged:  RN, PT HH Agency:  [facility]  Status of Service:  Completed, signed off  If discussed at [place] of Stay Meetings, dates discussed:    Additional Comments:  Latanya Maudlin, RN 05/29/2018, 1:04 PM

## 2018-05-29 NOTE — Progress Notes (Signed)
Per MD okay for RN to D/C PICC line.

## 2018-05-29 NOTE — Discharge Summary (Signed)
Physician Discharge Summary  Patient ID: Carly Khan MRN: [id number] DOB/AGE: Aug 31, 1951 67 y.o.  Admit date: 05/25/2018 Discharge date: 05/29/2018  Admission Diagnoses: Rectal bleeding. Gastrointestinal hemorrhage.   Discharge Diagnoses:  Active Problems:   Rectal bleeding   Gastrointestinal hemorrhage   Discharged Condition: good  Hospital Course: Patient with gastrointestinal bleeding due to anticoagulation. Needed blood transfusion. Recovered properly. Tolerating regular diet. Hemoglobin stable.   Consults: None  Significant Diagnostic Studies: CT scan  Treatments: IV hydration and blood transfusion.   Discharge Exam: Blood pressure (!) 122/99, pulse (!) 58, temperature (!) 97.5 F (36.4 C), temperature source Oral, resp. rate (!) 24, height 5\' 4"  (1.626 m), weight 60.1 kg (132 lb 9.6 oz), SpO2 100 %. General appearance: alert and cooperative Resp: clear to auscultation bilaterally GI: soft, non-tender; bowel sounds normal; no masses,  no organomegaly Incision/Wound:dry and clean.   Disposition: Discharge disposition: 01-Home or Self Care       Discharge Instructions    Diet - low sodium heart healthy   Complete by:  As directed    Increase activity slowly   Complete by:  As directed      Allergies as of 05/29/2018      Reactions   Penicillin G Hives, Itching   Has patient had a PCN reaction causing immediate rash, facial/tongue/throat swelling, SOB or lightheadedness with hypotension: Yes Has patient had a PCN reaction causing severe rash involving mucus membranes or skin necrosis: No Has patient had a PCN reaction that required hospitalization: No Has patient had a PCN reaction occurring within the last 10 years: Yes If all of the above answers are "NO", then may proceed with Cephalosporin use.   Succinylcholine    Patient's daughter had emergency surgery and was awake for the entire procedure, was told she should not have succ's and neither should her mom       Medication List    STOP taking these medications   enoxaparin 60 MG/0.6ML injection Commonly known as:  LOVENOX   warfarin 5 MG tablet Commonly known as:  COUMADIN     TAKE these medications   acetaminophen 500 MG tablet Commonly known as:  TYLENOL Take 2 tablets (1,000 mg total) by mouth every 6 (six) hours as needed for mild pain, fever or headache.   BREO ELLIPTA 100-25 MCG/INH Aepb Generic drug:  fluticasone furoate-vilanterol Inhale 1 puff into the lungs 2 (two) times daily.   ibuprofen 600 MG tablet Commonly known as:  ADVIL,MOTRIN Take 1 tablet (600 mg total) by mouth every 8 (eight) hours as needed for moderate pain.   metoprolol succinate 25 MG 24 hr tablet Commonly known as:  TOPROL-XL Take 1 tablet (25 mg total) by mouth daily.   omeprazole 20 MG capsule Commonly known as:  PRILOSEC Take 1 capsule (20 mg total) by mouth 2 (two) times daily before a meal.   oxyCODONE 5 MG immediate release tablet Commonly known as:  Oxy IR/ROXICODONE Take 1 tablet (5 mg total) by mouth every 6 (six) hours as needed for severe pain.   polyethylene glycol packet Commonly known as:  MIRALAX / GLYCOLAX Take 17 g by mouth daily as needed for mild constipation.   pravastatin 10 MG tablet Commonly known as:  PRAVACHOL Take 10 mg by mouth daily.   psyllium 95 % Pack Commonly known as:  HYDROCIL/METAMUCIL Take 1 packet by mouth daily.      Follow-up Information    [place], [place] F, MD Follow up in 1 week(s).  Specialty:  General Surgery Contact information: [place] [address] [phone]           Signed: Herbert Pun 05/29/2018, 10:20 AM

## 2018-05-29 NOTE — Discharge Instructions (Signed)
°  Diet: Resume home heart healthy regular diet.   Activity: No heavy lifting >20 pounds (children, pets, laundry, garbage) or strenuous activity until follow-up, but light activity and walking are encouraged. Do not drive or drink alcohol if taking narcotic pain medications.  Wound care: May shower with soapy water and pat dry (do not rub incisions), but no baths or submerging incision underwater until follow-up. (no swimming)   Medications: Resume all home medications except warfarin. For mild to moderate pain: acetaminophen (Tylenol) or ibuprofen (if no kidney disease).

## 2018-06-07 ENCOUNTER — Telehealth: Payer: Self-pay | Admitting: Internal Medicine

## 2018-06-07 NOTE — Telephone Encounter (Signed)
----- Message from Stana Bunting, RN sent at 06/07/2018  7:15 AM EDT ----- Regarding: FW: Resume coumadin after bleed? It doesn't look like she has a f/u  appt yet.  Can you see if she can come in to see Dr. Saunders Revel?  ----- Message ----- From: Stana Bunting, RN Sent: 06/02/2018   1:08 PM To: Vanessa Ralphs, RN Subject: FW: Resume coumadin after bleed?               Carly Khan, Carly Khan is currently holding her coumadin, so I wanted to see if you wanted to reach out to her about patient assistance and/or providing samples of Xarelto or Eliquis when she is ok'd to resume.  She doesn't have a f/u w/ Dr. Saunders Revel and doesn't need to see me until/if she resumes coumadin, so maybe it would be a good idea to have her come in for an appt to discuss?  She is very difficult to get ahold of and it's even harder to get her to come in for appts. -Mandi  ----- Message ----- From: Erskine Emery, [facility] Sent: 06/02/2018  11:55 AM To: Stana Bunting, RN Subject: RE: Resume coumadin after bleed?               [facility],  Unfortunately I do not have a great answer for this patient. I know she previously was on Eliquis (though it does not sound like she was compliant with that either). When I saw her, her daughter seemed to be somewhat involved in her care and seemed to really want to help. Potentially having her be more involved may help. We could also look at having her apply for patient assistance for Eliquis or Xarelto. She will need to bring copies of her medication costs from the pharmacy for this year (Jan 2019 - today) and a copy of her income (a tax statement or bank statement) and fill out the forms. It generally takes a few weeks to hear back from them, but that would likely give her free medication and reduce the number of visits. Let me know if there is anything else that I can be of assistance with.   Thanks, Georgina Peer  ----- Message ----- From: Stana Bunting, RN Sent: 06/02/2018   9:48 AM To: Erskine Emery, [facility] Subject: FW: Resume coumadin after bleed?               Georgina Peer, This lady has been a challenge as far as getting her to be compliant w/ visits for INR checks.  She has family, financial and transportation issues that make it difficult for her to make it to appts and though she verbalizes understanding of the importance of anticoagulation, I don't think she fully grasps it.  Do you have any suggestions? -Mandi  ----- Message ----- From: [name], MD Sent: 06/02/2018   9:03 AM To: Stana Bunting, RN Subject: RE: Resume coumadin after bleed?               Hi Mandi, Please see message below from Dr. Dahlia Byes (surgery) regarding anticoagulation.  I think we have been down this road before, but are there any options for pharmacy assistance that could help [name] afford Eliquis or Xarelto?  Thanks.  Robinette Haines, thank you for writing. It is a difficult decision, She bleed to a hb of 6 with an INR of 4.5. Required transfusions of both RBC and FFP.  From my recollection coumadin is  the only medication she could afford, but I am not sure if some of the other agents may be more appropriate for her. From our surgical perspective I would like to hold anticoagulation for at least 2 more weeks. I do understand that she has a left atrial thrombus but she has showed cleared evidence that her anticoagulation was an issue.  Please let me know your thoughts.  Thanks  Diego   ----- Message ----- From: Stana Bunting, RN Sent: 05/31/2018  10:27 AM To: [name], MD Subject: Resume coumadin after bleed?                   Dr. 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  Her coumadin was held and she was given blood, but it doesn't look like she was d/c'd w/ any instructions on resuming coumadin.  She doesn't have a f/u appt w/ you (looks like she cancelled her last appt), so I'll have the ladies up front see if she can come in.  Would  you like her to resume coumadin before that time? Thank you! -[facility]

## 2018-06-07 NOTE — Telephone Encounter (Signed)
Called patient to schedule appointment  Offered her an appointment this week with Dr End but was won't work with her Ride. She stated she could only do Wednesday mornings and my next available I told patient would be longer than what we needed her in, I then stated to her that without seeing her we could not be able to care for her and It would be harder to treat her. She got upset and states if we couldn't be her doctor in the hospital, why didn't we do this while she was admitted.  I mentioned to her she's missed a few appointment and that we need to see her   She was upset and states she would call us back and then hung up.  Will await her call back

## 2018-06-07 NOTE — Telephone Encounter (Signed)
S/w patient. She was very upset about the previous conversation she had with the scheduler. Patient states she was told we would not see her anymore. I explained that we can still see her at this time and I want to help find an appointment that she can come to. I asked if her ride could make an exception for this Wednesday or Thursday afternoon so she could come and see Dr End. She said she would check and let us know. I went ahead and put her in appointment with Dr End on Wednesday.

## 2018-06-07 NOTE — Telephone Encounter (Signed)
Attempted to confirm appointment for this Wednesday with Dr End. No answer and no VM set up.  Attempted to contact daughter. Not available and said  "number not reachable." Will try again later.

## 2018-06-09 ENCOUNTER — Ambulatory Visit: Payer: Medicare Other | Admitting: Internal Medicine

## 2018-06-09 ENCOUNTER — Encounter: Payer: Self-pay | Admitting: Surgery

## 2018-06-09 ENCOUNTER — Ambulatory Visit (INDEPENDENT_AMBULATORY_CARE_PROVIDER_SITE_OTHER): Payer: Medicare Other | Admitting: Surgery

## 2018-06-09 VITALS — BP 163/94 | HR 92 | Temp 98.7°F | Ht 64.0 in | Wt 130.0 lb

## 2018-06-09 DIAGNOSIS — K435 Parastomal hernia without obstruction or  gangrene: Secondary | ICD-10-CM | POA: Diagnosis not present

## 2018-06-09 DIAGNOSIS — Z932 Ileostomy status: Secondary | ICD-10-CM | POA: Diagnosis not present

## 2018-06-09 NOTE — Patient Instructions (Signed)
Return in six weeks

## 2018-06-09 NOTE — Progress Notes (Signed)
S/p ileostomy takedown by Dr. Hampton Abbot 7/16 Had anastomotic bleed due to coumadin Doing well No more melena  PE NAD Abd: soft, staples removed. No infection  A/p Doing well We will arrange cards f/u I do think that she can restart coumadin 1-2 weeks from now , we will let cardiology make that decision RTC w Dr. Hampton Abbot in 3 weeks

## 2018-06-09 NOTE — Progress Notes (Deleted)
   Follow-up Outpatient Visit Date: 06/09/2018  Primary Care Provider: [facility], [place] [address]  Chief Complaint: ***  HPI:  Carly Khan is a 67 y.o. year-old female with history of ***, who presents for follow-up of ***.  --------------------------------------------------------------------------------------------------  ***  Recent CV Pertinent Labs: Lab Results  Component Value Date   INR 1.17 05/28/2018   BNP 46.0 07/10/2017   K 4.1 05/28/2018   MG 2.1 05/19/2018   BUN 11 05/28/2018   CREATININE 0.92 05/28/2018    Past medical and surgical history were reviewed and updated in EPIC.  No outpatient medications have been marked as taking for the 06/09/18 encounter (Appointment) with Mikah Rottinghaus, Harrell Gave, MD.    Allergies: Penicillin g and Succinylcholine  Social History   Tobacco Use  . Smoking status: Current Every Day Smoker    Packs/day: 0.50    Years: 40.00    Pack years: 20.00  . Smokeless tobacco: Never Used  . Tobacco comment: denied smoking info  Substance Use Topics  . Alcohol use: No  . Drug use: No    Family History  Problem Relation Age of Onset  . Cancer Mother   . Stomach cancer Mother   . Heart disease Father   . Hypertension Father   . Parkinson's disease Sister   . Cervical cancer Sister     Review of Systems: A 12-system review of systems was performed and was negative except as noted in the HPI.  --------------------------------------------------------------------------------------------------  Physical Exam: There were no vitals taken for this visit.  General:  *** HEENT: No conjunctival pallor or scleral icterus. Moist mucous membranes.  OP clear. Neck: Supple without lymphadenopathy, thyromegaly, JVD, or HJR. No carotid bruit. Lungs: Normal work of breathing. Clear to auscultation bilaterally without wheezes or crackles. Heart: Regular rate and rhythm without murmurs, rubs, or  gallops. Non-displaced PMI. Abd: Bowel sounds present. Soft, NT/ND without hepatosplenomegaly Ext: No lower extremity edema. Radial, PT, and DP pulses are 2+ bilaterally. Skin: Warm and dry without rash.  EKG:  ***  Lab Results  Component Value Date   WBC 5.0 05/28/2018   HGB 9.2 (L) 05/28/2018   HCT 26.0 (L) 05/28/2018   MCV 87.9 05/28/2018   PLT 210 05/28/2018    Lab Results  Component Value Date   NA 139 05/28/2018   K 4.1 05/28/2018   CL 105 05/28/2018   CO2 29 05/28/2018   BUN 11 05/28/2018   CREATININE 0.92 05/28/2018   GLUCOSE 85 05/28/2018   ALT 7 05/28/2018    No results found for: CHOL, HDL, LDLCALC, LDLDIRECT, TRIG, CHOLHDL  --------------------------------------------------------------------------------------------------  ASSESSMENT AND PLAN: ***  [name], MD 06/09/2018 2:37 PM

## 2018-06-10 ENCOUNTER — Encounter: Payer: Self-pay | Admitting: Internal Medicine

## 2018-06-18 ENCOUNTER — Telehealth: Payer: Self-pay

## 2018-06-18 NOTE — Telephone Encounter (Signed)
-----   Message from Mickie Kay sent at 06/17/2018  5:20 PM EDT ----- Regarding: RE: referral appt has been made and patient has been advised. Dr Vickki Hearing @ 10:00am ----- Message ----- From: Wayna Chalet, Joy Sent: 06/09/2018   9:36 AM EDT To: Albin Felling Brouillard Subject: referral                                       Can you please send a referral to [facility] cardiology for the patient to be seen for Paroxysmal atrial fibrillation. Referring doctor: Dr. Dahlia Byes. Thank you!

## 2018-06-18 NOTE — Telephone Encounter (Signed)
Patient will be seen by the cardiologist on 07/07/2018 and Dr. Hampton Abbot on 07/07/2018 too.

## 2018-06-20 IMAGING — CT CT ABD-PELV W/ CM
2 of 5 series · 15 of 46 positions shown, 17 images · IV contrast (APPLIED)
Comparison: Noncontrast CT 12/28/2017

CLINICAL DATA: Nausea, vomiting and diarrhea. Patient with right
lower quadrant colostomy. Recently passed formed bowel movement
through the rectum which is not happened in years.

EXAM:
CT ABDOMEN AND PELVIS WITH CONTRAST
TECHNIQUE: Multidetector CT imaging of the abdomen and pelvis was performed
using the standard protocol following bolus administration of
intravenous contrast.
CONTRAST:  100mL [id number] IOPAMIDOL ([id number]) INJECTION 61%

[Series 2: routine abd/pel with · axial · 0.71mm/px · z∈[-1110,-740]mm · 12 of 84 slices shown, 14 images]
[im 5/84  soft-tissue]
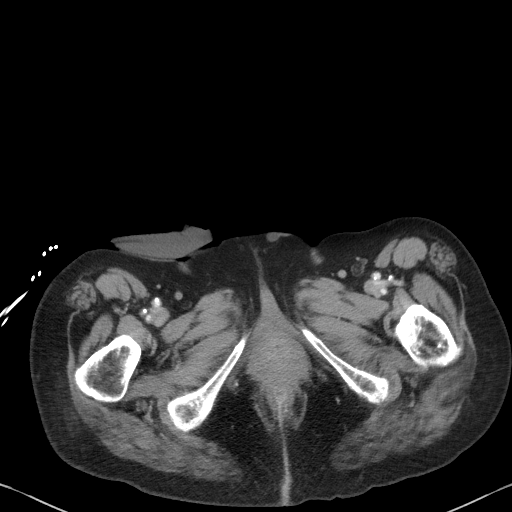
[im 5/84  bone]
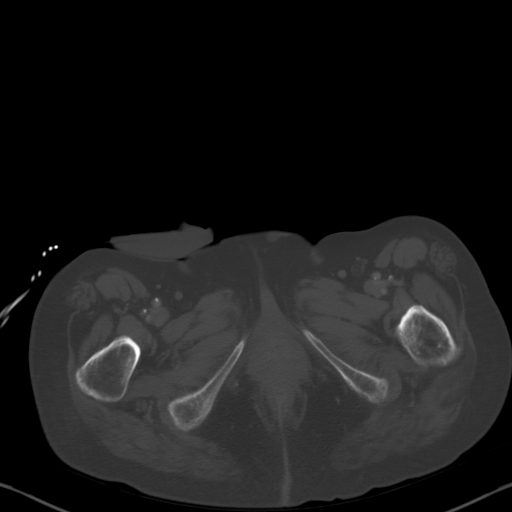
[im 14/84  soft-tissue]
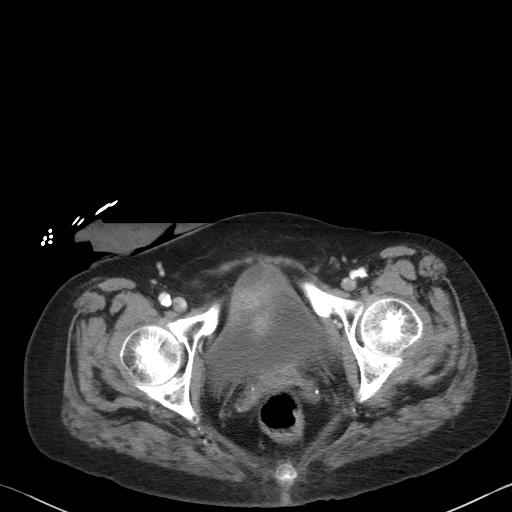
[im 18/84  soft-tissue]
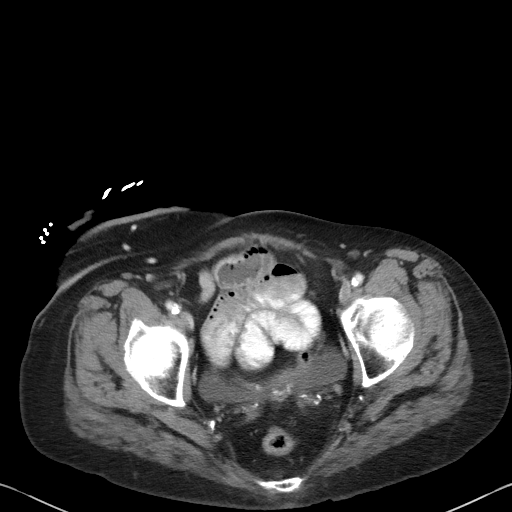
[im 27/84  soft-tissue]
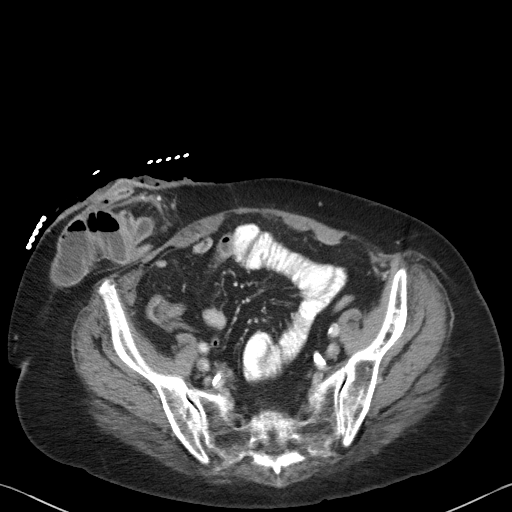
[im 31/84  soft-tissue]
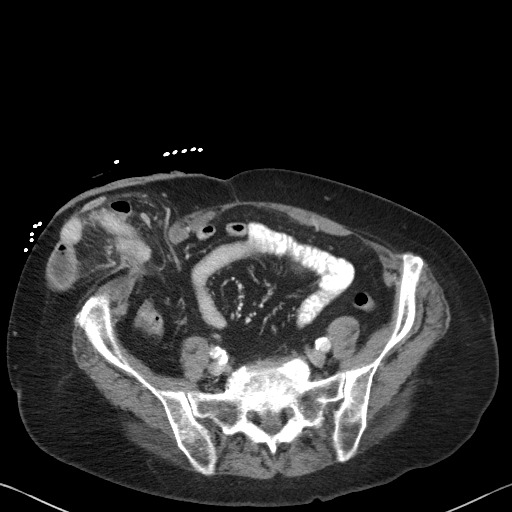
[im 40/84  soft-tissue]
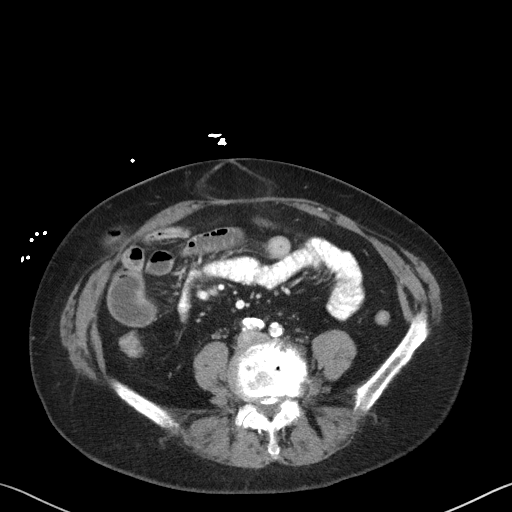
[im 44/84  soft-tissue]
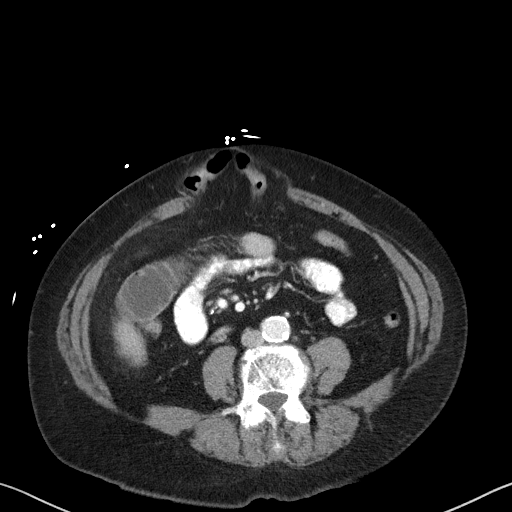
[im 53/84  soft-tissue]
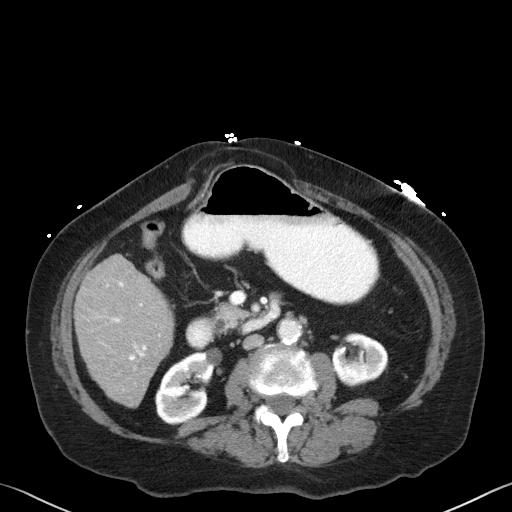
[im 57/84  soft-tissue]
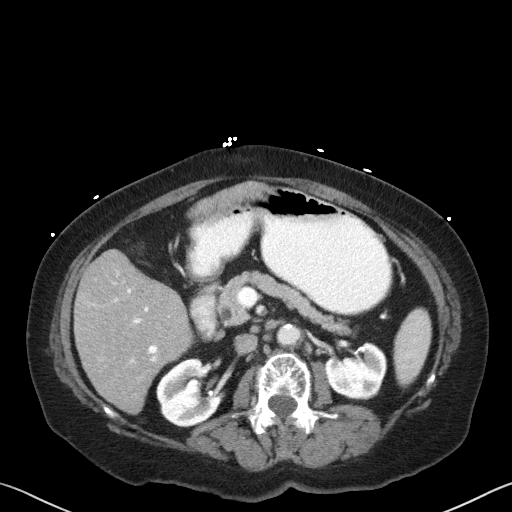
[im 57/84  bone]
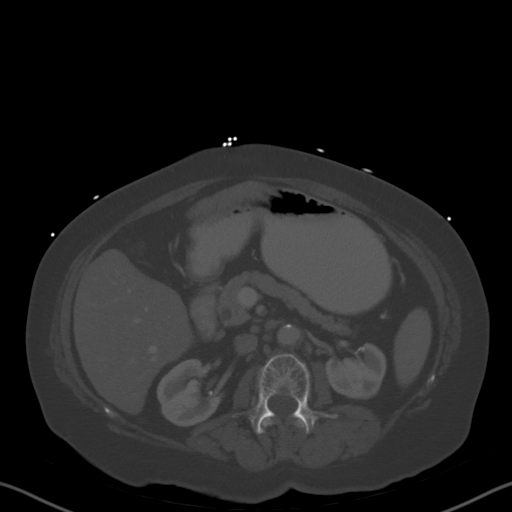
[im 66/84  soft-tissue]
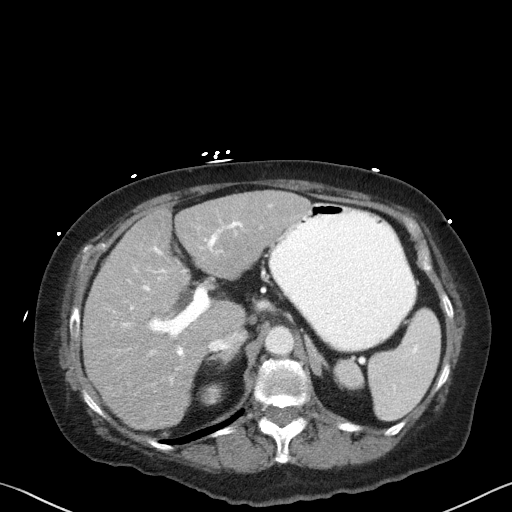
[im 70/84  soft-tissue]
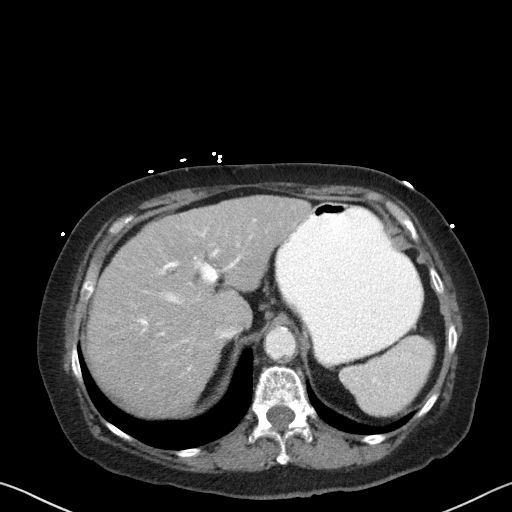
[im 79/84  soft-tissue]
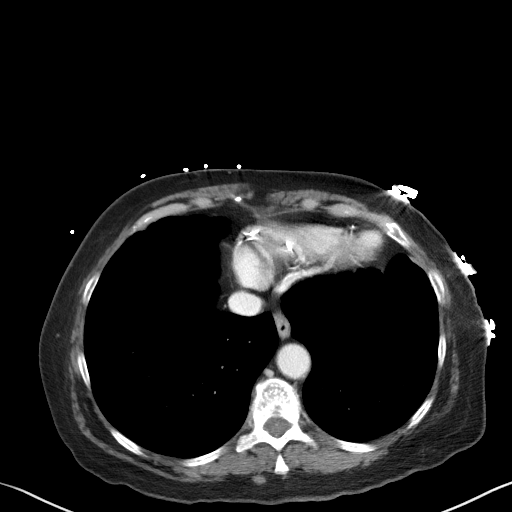

[Series 5: coronal st · coronal · 0.70mm/px · 3 of 90 slices shown]
[im 30/90  soft-tissue]
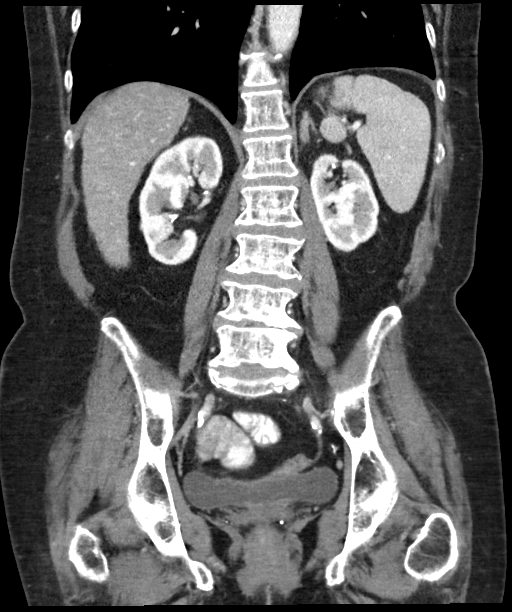
[im 40/90  soft-tissue]
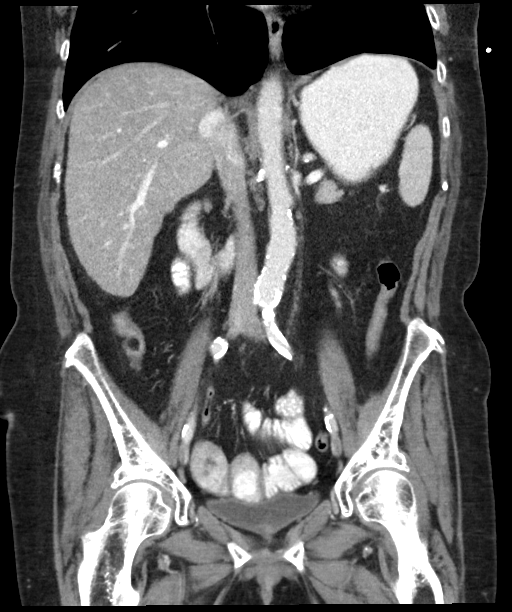
[im 50/90  soft-tissue]
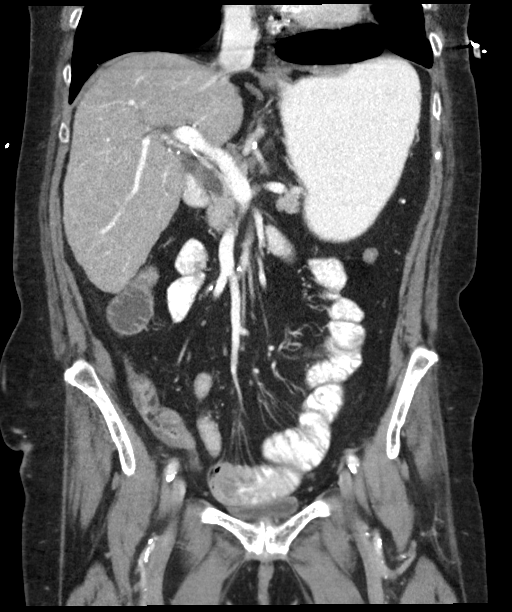

[15 of 46 positions shown; findings below may reference images not displayed]

FINDINGS: Lower chest: The lung bases are clear.

Hepatobiliary: Decreased hepatic density suggesting steatosis. Fine
nodular hepatic contours again seen. Postcholecystectomy with stable
biliary prominence, common bile duct measures 13 mm at the porta
hepatis with normal tapering distally.

Pancreas: No ductal dilatation or inflammation.

Spleen: Normal in size without focal abnormality.

Adrenals/Urinary Tract: No adrenal nodule. No hydronephrosis or
perinephric edema. Homogeneous renal enhancement with symmetric
excretion on delayed phase imaging. Again seen cyst in the anterior
mid right kidney, with additional tiny cortical hypodensities.
Cortical scarring in the left upper pole. Urinary bladder is
physiologically distended without wall thickening.

Stomach/Bowel: Stomach is distended with enteric contrast. Findings
suspicious for recurrent small bowel obstruction with dilated
fluid-filled small bowel with transition point in the right lower
quadrant peristomal hernia. More distal small bowel is decompressed.
Mild stranding in the peristomal hernia with trace free fluid. The
appendix is visualized and is normal. The colon is decompressed,
which answers colon extending into anterior abdominal wall laxity.
Unchanged enteric sutures in the sigmoid colon. The previous enteric
contrast throughout the colon on prior exam has been evacuated,
including the previous barium in the colon. No evidence of colo
enteric fistula. Small mural lipoma in the ascending colon measuring
15 mm

Vascular/Lymphatic: Aorto bi-iliac atherosclerosis. Small
retroperitoneal nodes not enlarged by size criteria. Portal vein and
mesenteric vessels are patent. No portal venous or mesenteric gas.

Reproductive: Status post hysterectomy. No adnexal masses.

Other: Laxity of the anterior abdominal wall musculature with
stomach and transverse colon extending into the lax defect. No
ascites or free air in the abdomen or pelvis. No intra-abdominal
abscess.

Musculoskeletal: Bones are under mineralized. There are no acute or
suspicious osseous abnormalities. Degenerative change in the lumbar
spine.
IMPRESSION: 1. Findings suspicious for recurrent small bowel obstruction with
transition point in the right lower quadrant parastomal hernia.
2. Evacuation of colonic contrast since December 2017 CT. No
evidence of colo enteric fistula.
3. Nodular hepatic contours suspicious for cirrhosis. Hepatic
steatosis.
4.  Aortic Atherosclerosis ([id number]).

## 2018-06-24 NOTE — Telephone Encounter (Signed)
Unable to reach patient. It appears patient has upcoming appointment with [facility].

## 2018-07-01 IMAGING — CR DG ABDOMEN 2V
3 series · 3 of 3 positions shown · non-contrast
Comparison: 04/18/2018 abdominal radiograph

CLINICAL DATA: Nausea and vomiting, right-sided colostomy

EXAM:
ABDOMEN - 2 VIEW

[abdomen erect]
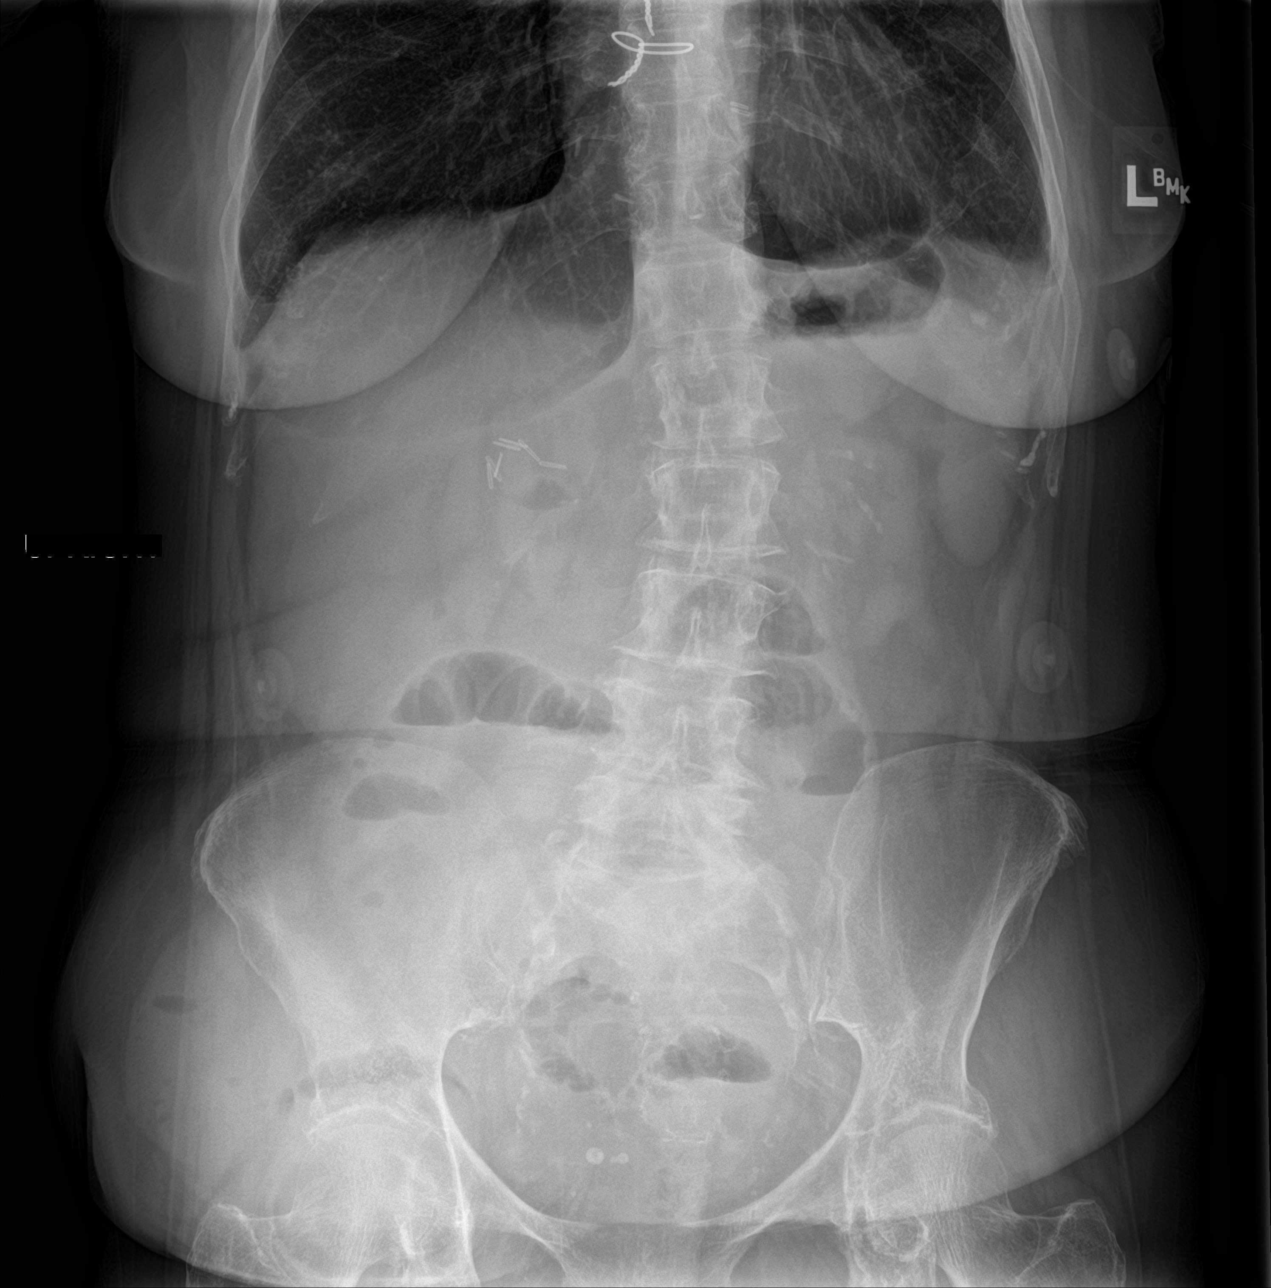

[abdomen supine (1 of 2)]
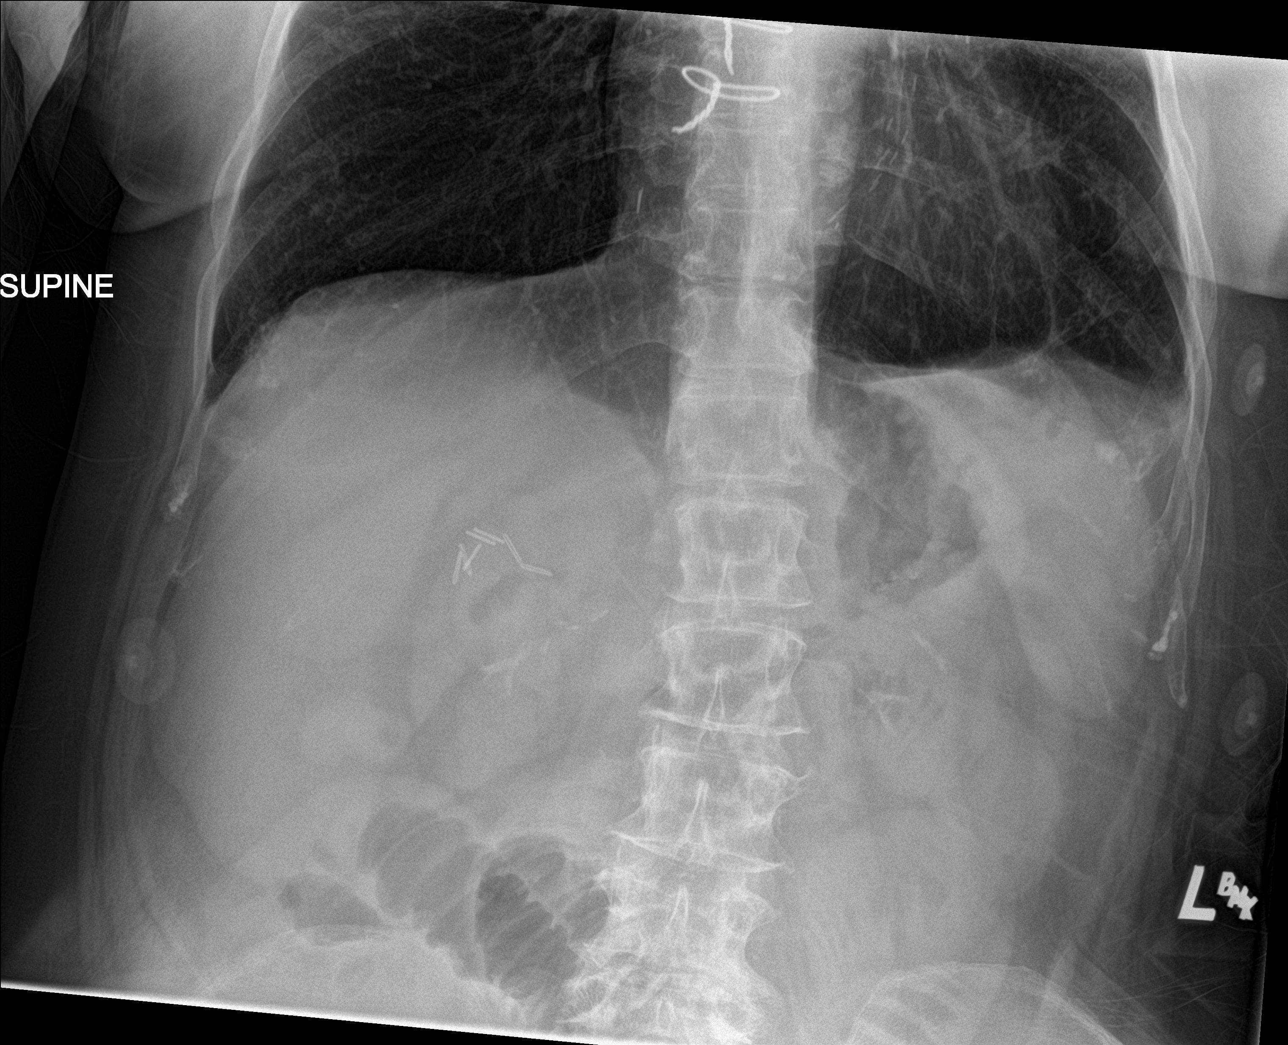

[abdomen supine (2 of 2)]
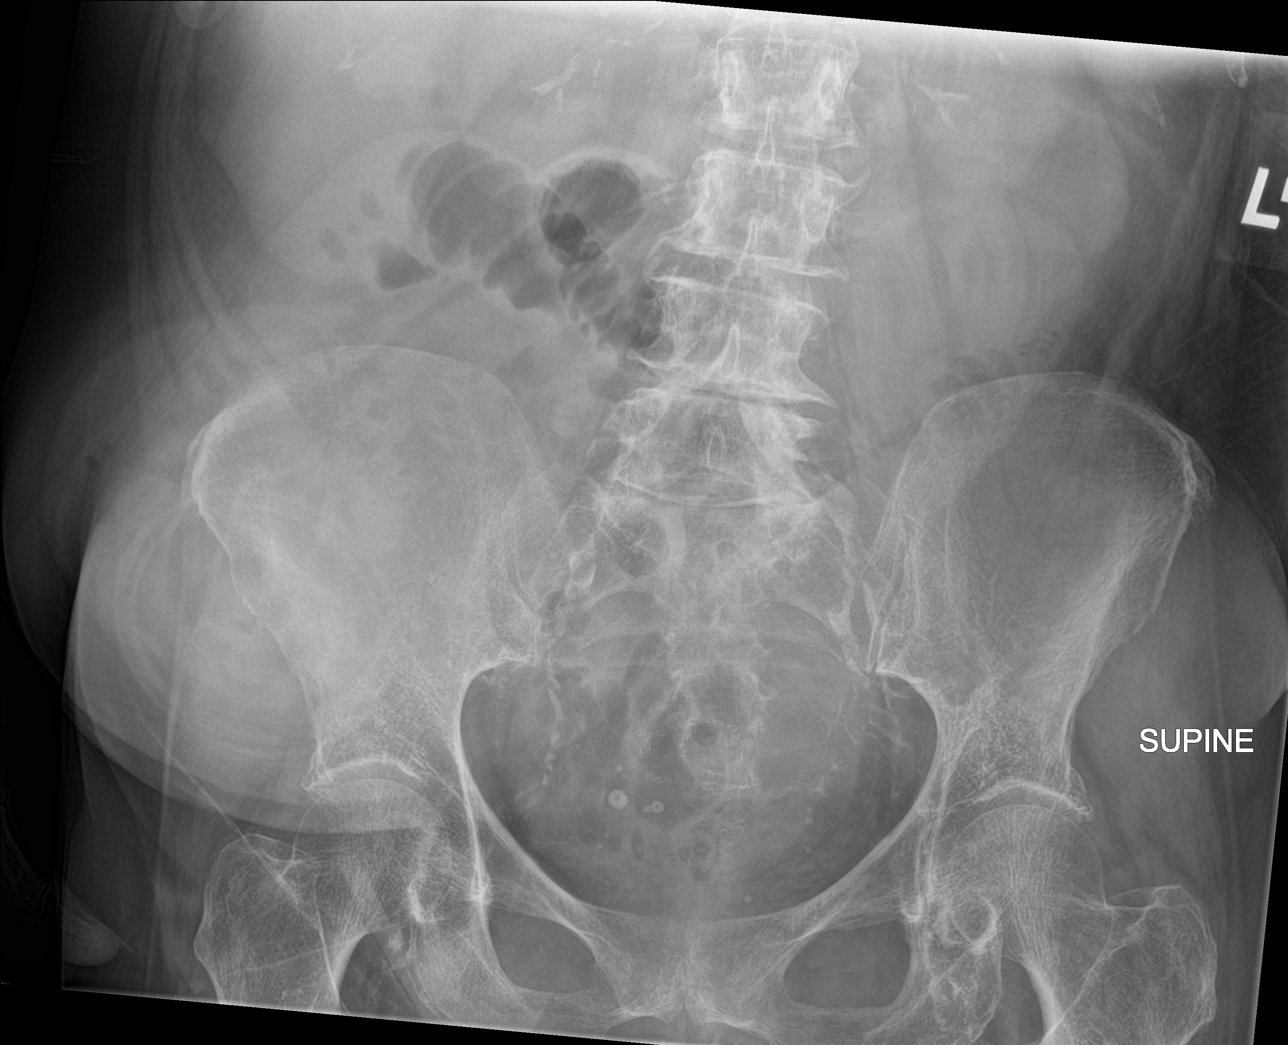

[3 of 3 positions shown; findings below may reference images not displayed]

FINDINGS: Multiple mildly dilated small bowel loops with air-fluid levels
throughout the abdomen measuring up to 3.5 cm diameter. No evidence
of pneumatosis or pneumoperitoneum. Clear lung bases. No radiopaque
nephrolithiasis. Cholecystectomy clips are seen in the right upper
quadrant of the abdomen. Intact visualized lower sternotomy wires.
Surgical sutures overlie the deep pelvis.
IMPRESSION: Dilated small bowel loops with air-fluid levels throughout the
abdomen compatible with distal small bowel obstruction, probably due
to parastomal hernia as seen on 04/18/2018 CT study. A repeat CT
abdomen/pelvis with oral and IV contrast may be obtained as
clinically warranted.

## 2018-07-06 ENCOUNTER — Ambulatory Visit: Payer: Medicare Other | Admitting: Surgery

## 2018-07-07 ENCOUNTER — Ambulatory Visit: Payer: Medicare Other | Admitting: Surgery

## 2018-09-29 ENCOUNTER — Encounter: Payer: Self-pay | Admitting: *Deleted

## 2018-10-14 ENCOUNTER — Telehealth: Payer: Self-pay | Admitting: *Deleted

## 2018-10-14 NOTE — Telephone Encounter (Signed)
Calling about missed appointments trying to reschedule to see if she is being followed by another provider. Letter mailed but returned. No answer on phone and unable to leave message.
